# Patient Record
Sex: Male | Born: 1975 | Race: White | Hispanic: No | Smoking: Former smoker
Health system: Southern US, Community
[De-identification: ages and names within clinical notes are randomized; demographics above are authoritative.]

## PROBLEM LIST (undated history)

## (undated) DIAGNOSIS — J9610 Chronic respiratory failure, unspecified whether with hypoxia or hypercapnia: Secondary | ICD-10-CM

## (undated) DIAGNOSIS — G1221 Amyotrophic lateral sclerosis: Secondary | ICD-10-CM

## (undated) DIAGNOSIS — E119 Type 2 diabetes mellitus without complications: Secondary | ICD-10-CM

## (undated) HISTORY — PX: OTHER SURGICAL HISTORY: SHX169

## (undated) HISTORY — PX: APPENDECTOMY: SHX54

## (undated) HISTORY — PX: TRACHEOSTOMY: SUR1362

## (undated) HISTORY — PX: PEG TUBE PLACEMENT: SUR1034

---

## 2004-11-27 ENCOUNTER — Emergency Department: Payer: Self-pay | Admitting: Emergency Medicine

## 2004-12-01 ENCOUNTER — Emergency Department: Payer: Self-pay | Admitting: Unknown Physician Specialty

## 2005-01-27 ENCOUNTER — Emergency Department: Payer: Self-pay | Admitting: Emergency Medicine

## 2005-03-14 ENCOUNTER — Ambulatory Visit: Payer: Self-pay | Admitting: Family Medicine

## 2005-05-08 ENCOUNTER — Emergency Department: Payer: Self-pay | Admitting: Emergency Medicine

## 2005-11-05 ENCOUNTER — Inpatient Hospital Stay: Payer: Self-pay | Admitting: Internal Medicine

## 2005-11-05 ENCOUNTER — Other Ambulatory Visit: Payer: Self-pay

## 2005-11-26 ENCOUNTER — Emergency Department: Payer: Self-pay | Admitting: Emergency Medicine

## 2006-05-24 ENCOUNTER — Emergency Department: Payer: Self-pay | Admitting: Emergency Medicine

## 2006-06-08 ENCOUNTER — Emergency Department: Payer: Self-pay | Admitting: General Practice

## 2006-10-11 ENCOUNTER — Emergency Department: Payer: Self-pay | Admitting: Emergency Medicine

## 2007-03-08 ENCOUNTER — Emergency Department: Payer: Self-pay | Admitting: Emergency Medicine

## 2007-05-28 ENCOUNTER — Emergency Department: Payer: Self-pay | Admitting: Emergency Medicine

## 2008-03-04 ENCOUNTER — Emergency Department: Payer: Self-pay | Admitting: Emergency Medicine

## 2008-03-31 ENCOUNTER — Emergency Department: Payer: Self-pay | Admitting: Emergency Medicine

## 2008-04-17 ENCOUNTER — Other Ambulatory Visit: Payer: Self-pay

## 2008-04-17 ENCOUNTER — Emergency Department: Payer: Self-pay | Admitting: Emergency Medicine

## 2008-09-29 ENCOUNTER — Emergency Department: Payer: Self-pay | Admitting: Internal Medicine

## 2009-03-21 ENCOUNTER — Emergency Department: Payer: Self-pay | Admitting: Emergency Medicine

## 2009-03-22 ENCOUNTER — Emergency Department: Payer: Self-pay | Admitting: Emergency Medicine

## 2009-03-23 ENCOUNTER — Emergency Department: Payer: Self-pay | Admitting: Emergency Medicine

## 2009-04-01 ENCOUNTER — Emergency Department: Payer: Self-pay | Admitting: Emergency Medicine

## 2009-12-19 ENCOUNTER — Emergency Department: Payer: Self-pay | Admitting: Emergency Medicine

## 2010-04-16 ENCOUNTER — Emergency Department: Payer: Self-pay | Admitting: Emergency Medicine

## 2010-10-08 ENCOUNTER — Emergency Department: Payer: Self-pay | Admitting: Emergency Medicine

## 2011-08-18 ENCOUNTER — Emergency Department: Payer: Self-pay | Admitting: Emergency Medicine

## 2011-10-25 ENCOUNTER — Emergency Department: Payer: Self-pay

## 2011-10-25 LAB — URINALYSIS, COMPLETE
Bacteria: NONE SEEN
Blood: NEGATIVE
Glucose,UR: >=500 mg/dL (ref 0–75)
Leukocyte Esterase: NEGATIVE
Nitrite: NEGATIVE
Protein: NEGATIVE
Specific Gravity: 1.032 (ref 1.003–1.030)

## 2011-10-25 LAB — CBC
HCT: 49.6 % (ref 40.0–52.0)
HGB: 16.7 g/dL (ref 13.0–18.0)
MCH: 30.7 pg (ref 26.0–34.0)
MCHC: 33.7 g/dL (ref 32.0–36.0)
MCV: 91 fL (ref 80–100)
RBC: 5.45 10*6/uL (ref 4.40–5.90)
RDW: 12.6 % (ref 11.5–14.5)

## 2011-10-25 LAB — ETHANOL
Ethanol %: <0.003 % (ref 0.000–0.080)
Ethanol: <3 mg/dL

## 2011-10-25 LAB — COMPREHENSIVE METABOLIC PANEL
Alkaline Phosphatase: 91 U/L (ref 50–136)
BUN: 9 mg/dL (ref 7–18)
Bilirubin,Total: 0.4 mg/dL (ref 0.2–1.0)
Chloride: 89 mmol/L — ABNORMAL LOW (ref 98–107)
Co2: 36 mmol/L — ABNORMAL HIGH (ref 21–32)
Creatinine: 0.91 mg/dL (ref 0.60–1.30)
EGFR (African American): >60
EGFR (Non-African Amer.): >60
Osmolality: 288 (ref 275–301)
Potassium: 4.7 mmol/L (ref 3.5–5.1)
Sodium: 132 mmol/L — ABNORMAL LOW (ref 136–145)
Total Protein: 8.2 g/dL (ref 6.4–8.2)

## 2011-10-25 LAB — DRUG SCREEN, URINE
Amphetamines, Ur Screen: NEGATIVE (ref ?–1000)
Barbiturates, Ur Screen: NEGATIVE (ref ?–200)
Cannabinoid 50 Ng, Ur ~~LOC~~: NEGATIVE (ref ?–50)
Methadone, Ur Screen: NEGATIVE (ref ?–300)
Opiate, Ur Screen: NEGATIVE (ref ?–300)
Phencyclidine (PCP) Ur S: POSITIVE (ref ?–25)
Tricyclic, Ur Screen: NEGATIVE (ref ?–1000)

## 2011-10-25 LAB — SALICYLATE LEVEL: Salicylates, Serum: 1.8 mg/dL

## 2011-10-25 LAB — HEMOGLOBIN A1C: Hemoglobin A1C: 14.5 % — ABNORMAL HIGH (ref 4.2–6.3)

## 2012-06-03 ENCOUNTER — Emergency Department: Payer: Self-pay | Admitting: Internal Medicine

## 2012-06-03 LAB — CBC
HCT: 48.4 % (ref 40.0–52.0)
HGB: 16.6 g/dL (ref 13.0–18.0)
MCV: 88 fL (ref 80–100)
WBC: 11.3 10*3/uL — ABNORMAL HIGH (ref 3.8–10.6)

## 2012-06-03 LAB — COMPREHENSIVE METABOLIC PANEL
Albumin: 3.9 g/dL (ref 3.4–5.0)
Alkaline Phosphatase: 141 U/L — ABNORMAL HIGH (ref 50–136)
Anion Gap: 8 (ref 7–16)
Calcium, Total: 9.1 mg/dL (ref 8.5–10.1)
Co2: 27 mmol/L (ref 21–32)
EGFR (Non-African Amer.): >60
Glucose: 249 mg/dL — ABNORMAL HIGH (ref 65–99)
Potassium: 3.4 mmol/L — ABNORMAL LOW (ref 3.5–5.1)
SGOT(AST): 15 U/L (ref 15–37)

## 2012-06-04 ENCOUNTER — Emergency Department: Payer: Self-pay | Admitting: Emergency Medicine

## 2012-06-09 LAB — CULTURE, BLOOD (SINGLE)

## 2012-07-06 ENCOUNTER — Emergency Department: Payer: Self-pay | Admitting: Emergency Medicine

## 2012-07-08 ENCOUNTER — Emergency Department: Payer: Self-pay | Admitting: Emergency Medicine

## 2012-09-02 ENCOUNTER — Emergency Department: Payer: Self-pay | Admitting: Emergency Medicine

## 2012-09-02 LAB — CBC
HCT: 46.1 % (ref 40.0–52.0)
MCH: 30.5 pg (ref 26.0–34.0)
MCHC: 33.5 g/dL (ref 32.0–36.0)
MCV: 91 fL (ref 80–100)
Platelet: 234 10*3/uL (ref 150–440)
RBC: 5.06 10*6/uL (ref 4.40–5.90)
RDW: 13.3 % (ref 11.5–14.5)
WBC: 3.8 10*3/uL (ref 3.8–10.6)

## 2012-09-02 LAB — TSH: Thyroid Stimulating Horm: 1.54 u[IU]/mL

## 2012-09-02 LAB — ACETAMINOPHEN LEVEL: Acetaminophen: <2 ug/mL

## 2012-09-02 LAB — DRUG SCREEN, URINE
Barbiturates, Ur Screen: NEGATIVE (ref ?–200)
Cannabinoid 50 Ng, Ur ~~LOC~~: NEGATIVE (ref ?–50)
Cocaine Metabolite,Ur ~~LOC~~: NEGATIVE (ref ?–300)
Opiate, Ur Screen: NEGATIVE (ref ?–300)
Tricyclic, Ur Screen: NEGATIVE (ref ?–1000)

## 2012-09-02 LAB — URINALYSIS, COMPLETE
Bilirubin,UR: NEGATIVE
Blood: NEGATIVE
Ketone: NEGATIVE
Leukocyte Esterase: NEGATIVE
Ph: 6 (ref 4.5–8.0)
RBC,UR: <1 /HPF (ref 0–5)
Specific Gravity: 1.035 (ref 1.003–1.030)
Squamous Epithelial: NONE SEEN

## 2012-09-02 LAB — COMPREHENSIVE METABOLIC PANEL
Alkaline Phosphatase: 119 U/L (ref 50–136)
BUN: 5 mg/dL — ABNORMAL LOW (ref 7–18)
Bilirubin,Total: 0.1 mg/dL — ABNORMAL LOW (ref 0.2–1.0)
Chloride: 101 mmol/L (ref 98–107)
Co2: 23 mmol/L (ref 21–32)
Creatinine: 0.78 mg/dL (ref 0.60–1.30)
EGFR (African American): >60
EGFR (Non-African Amer.): >60
Osmolality: 285 (ref 275–301)
Potassium: 3.7 mmol/L (ref 3.5–5.1)
Sodium: 136 mmol/L (ref 136–145)

## 2012-09-02 LAB — TROPONIN I: Troponin-I: <0.02 ng/mL

## 2012-09-02 LAB — CK TOTAL AND CKMB (NOT AT ARMC)
CK, Total: 139 U/L (ref 35–232)
CK-MB: <0.5 ng/mL — ABNORMAL LOW (ref 0.5–3.6)

## 2012-09-02 LAB — ETHANOL
Ethanol %: 0.02 % (ref 0.000–0.080)
Ethanol: 20 mg/dL

## 2012-09-02 LAB — SALICYLATE LEVEL: Salicylates, Serum: <1.7 mg/dL

## 2013-01-22 ENCOUNTER — Emergency Department: Payer: Self-pay

## 2013-01-22 LAB — CBC
HCT: 48.3 % (ref 40.0–52.0)
HGB: 16.2 g/dL (ref 13.0–18.0)
MCH: 30.7 pg (ref 26.0–34.0)
MCV: 91 fL (ref 80–100)
RBC: 5.28 10*6/uL (ref 4.40–5.90)
RDW: 12.8 % (ref 11.5–14.5)

## 2013-01-22 LAB — BASIC METABOLIC PANEL
Anion Gap: 9 (ref 7–16)
Chloride: 95 mmol/L — ABNORMAL LOW (ref 98–107)
Co2: 29 mmol/L (ref 21–32)
Creatinine: 0.81 mg/dL (ref 0.60–1.30)
Glucose: 335 mg/dL — ABNORMAL HIGH (ref 65–99)
Potassium: 4.4 mmol/L (ref 3.5–5.1)

## 2013-08-24 ENCOUNTER — Emergency Department: Payer: Self-pay | Admitting: Emergency Medicine

## 2013-08-24 LAB — URINALYSIS, COMPLETE
Bilirubin,UR: NEGATIVE
Blood: NEGATIVE
Ph: 6 (ref 4.5–8.0)
Protein: NEGATIVE
Specific Gravity: 1.036 (ref 1.003–1.030)
Squamous Epithelial: NONE SEEN
WBC UR: <1 /HPF (ref 0–5)

## 2013-08-24 LAB — CBC
HCT: 43.2 % (ref 40.0–52.0)
HGB: 14.8 g/dL (ref 13.0–18.0)
MCHC: 34.3 g/dL (ref 32.0–36.0)
Platelet: 329 10*3/uL (ref 150–440)
RDW: 12.6 % (ref 11.5–14.5)

## 2013-08-24 LAB — COMPREHENSIVE METABOLIC PANEL
Albumin: 3.5 g/dL (ref 3.4–5.0)
Alkaline Phosphatase: 105 U/L
BUN: 9 mg/dL (ref 7–18)
Chloride: 95 mmol/L — ABNORMAL LOW (ref 98–107)
Co2: 24 mmol/L (ref 21–32)
EGFR (African American): >60
EGFR (Non-African Amer.): >60
Osmolality: 279 (ref 275–301)
Potassium: 3.3 mmol/L — ABNORMAL LOW (ref 3.5–5.1)
SGOT(AST): 20 U/L (ref 15–37)
Total Protein: 6.9 g/dL (ref 6.4–8.2)

## 2013-08-24 LAB — DRUG SCREEN, URINE
Barbiturates, Ur Screen: NEGATIVE (ref ?–200)
Cocaine Metabolite,Ur ~~LOC~~: NEGATIVE (ref ?–300)
MDMA (Ecstasy)Ur Screen: NEGATIVE (ref ?–500)
Opiate, Ur Screen: NEGATIVE (ref ?–300)
Phencyclidine (PCP) Ur S: POSITIVE (ref ?–25)

## 2013-08-24 LAB — ETHANOL: Ethanol: 7 mg/dL

## 2013-08-24 LAB — ACETAMINOPHEN LEVEL: Acetaminophen: <2 ug/mL

## 2013-08-24 LAB — TROPONIN I: Troponin-I: <0.02 ng/mL

## 2014-03-28 ENCOUNTER — Emergency Department: Payer: Self-pay | Admitting: Emergency Medicine

## 2014-03-28 LAB — COMPREHENSIVE METABOLIC PANEL
ALT: 31 U/L (ref 12–78)
Albumin: 3.2 g/dL — ABNORMAL LOW (ref 3.4–5.0)
Alkaline Phosphatase: 88 U/L
Anion Gap: 6 — ABNORMAL LOW (ref 7–16)
BUN: 17 mg/dL (ref 7–18)
Bilirubin,Total: 0.2 mg/dL (ref 0.2–1.0)
CALCIUM: 8.2 mg/dL — AB (ref 8.5–10.1)
Chloride: 99 mmol/L (ref 98–107)
Co2: 26 mmol/L (ref 21–32)
Creatinine: 1.07 mg/dL (ref 0.60–1.30)
GLUCOSE: 454 mg/dL — AB (ref 65–99)
Osmolality: 284 (ref 275–301)
POTASSIUM: 4.1 mmol/L (ref 3.5–5.1)
SGOT(AST): 26 U/L (ref 15–37)
SODIUM: 131 mmol/L — AB (ref 136–145)
Total Protein: 6.5 g/dL (ref 6.4–8.2)

## 2014-03-28 LAB — CBC
HCT: 46.9 % (ref 40.0–52.0)
HGB: 15.6 g/dL (ref 13.0–18.0)
MCH: 30.8 pg (ref 26.0–34.0)
MCHC: 33.3 g/dL (ref 32.0–36.0)
MCV: 93 fL (ref 80–100)
Platelet: 312 10*3/uL (ref 150–440)
RBC: 5.05 10*6/uL (ref 4.40–5.90)
RDW: 12.8 % (ref 11.5–14.5)
WBC: 6.9 10*3/uL (ref 3.8–10.6)

## 2014-03-28 LAB — DRUG SCREEN, URINE
AMPHETAMINES, UR SCREEN: NEGATIVE (ref ?–1000)
BENZODIAZEPINE, UR SCRN: NEGATIVE (ref ?–200)
Barbiturates, Ur Screen: NEGATIVE (ref ?–200)
COCAINE METABOLITE, UR ~~LOC~~: NEGATIVE (ref ?–300)
Cannabinoid 50 Ng, Ur ~~LOC~~: NEGATIVE (ref ?–50)
MDMA (Ecstasy)Ur Screen: NEGATIVE (ref ?–500)
METHADONE, UR SCREEN: NEGATIVE (ref ?–300)
OPIATE, UR SCREEN: NEGATIVE (ref ?–300)
PHENCYCLIDINE (PCP) UR S: NEGATIVE (ref ?–25)
Tricyclic, Ur Screen: NEGATIVE (ref ?–1000)

## 2014-03-28 LAB — URINALYSIS, COMPLETE
BACTERIA: NONE SEEN
BILIRUBIN, UR: NEGATIVE
BLOOD: NEGATIVE
Glucose,UR: >=500 mg/dL (ref 0–75)
LEUKOCYTE ESTERASE: NEGATIVE
Nitrite: NEGATIVE
Ph: 6 (ref 4.5–8.0)
Protein: NEGATIVE
RBC,UR: <1 /HPF (ref 0–5)
SPECIFIC GRAVITY: 1.03 (ref 1.003–1.030)
SQUAMOUS EPITHELIAL: NONE SEEN
WBC UR: <1 /HPF (ref 0–5)

## 2014-03-28 LAB — ETHANOL: Ethanol %: <0.003 % (ref 0.000–0.080)

## 2014-03-28 LAB — TROPONIN I: Troponin-I: <0.02 ng/mL

## 2014-03-28 LAB — AMMONIA: AMMONIA, PLASMA: 41 umol/L — AB (ref 11–32)

## 2014-03-29 ENCOUNTER — Emergency Department: Payer: Self-pay | Admitting: Emergency Medicine

## 2014-03-29 LAB — COMPREHENSIVE METABOLIC PANEL
ALK PHOS: 84 U/L
ALT: 30 U/L (ref 12–78)
AST: 20 U/L (ref 15–37)
Albumin: 3.8 g/dL (ref 3.4–5.0)
Anion Gap: 5 — ABNORMAL LOW (ref 7–16)
BILIRUBIN TOTAL: 0.3 mg/dL (ref 0.2–1.0)
BUN: 11 mg/dL (ref 7–18)
CALCIUM: 9.7 mg/dL (ref 8.5–10.1)
CHLORIDE: 99 mmol/L (ref 98–107)
Co2: 31 mmol/L (ref 21–32)
Creatinine: 0.79 mg/dL (ref 0.60–1.30)
EGFR (African American): >60
EGFR (Non-African Amer.): >60
Glucose: 311 mg/dL — ABNORMAL HIGH (ref 65–99)
OSMOLALITY: 281 (ref 275–301)
Potassium: 4.6 mmol/L (ref 3.5–5.1)
Sodium: 135 mmol/L — ABNORMAL LOW (ref 136–145)
Total Protein: 7.2 g/dL (ref 6.4–8.2)

## 2014-03-29 LAB — CBC
HCT: 47.6 % (ref 40.0–52.0)
HGB: 15.9 g/dL (ref 13.0–18.0)
MCH: 30.9 pg (ref 26.0–34.0)
MCHC: 33.4 g/dL (ref 32.0–36.0)
MCV: 93 fL (ref 80–100)
Platelet: 332 10*3/uL (ref 150–440)
RBC: 5.15 10*6/uL (ref 4.40–5.90)
RDW: 12.5 % (ref 11.5–14.5)
WBC: 6.8 10*3/uL (ref 3.8–10.6)

## 2014-03-29 LAB — URINALYSIS, COMPLETE
BACTERIA: NONE SEEN
BLOOD: NEGATIVE
Bilirubin,UR: NEGATIVE
Glucose,UR: >=500 mg/dL (ref 0–75)
KETONE: NEGATIVE
Leukocyte Esterase: NEGATIVE
Nitrite: NEGATIVE
Ph: 7 (ref 4.5–8.0)
Protein: NEGATIVE
RBC, UR: NONE SEEN /HPF (ref 0–5)
SPECIFIC GRAVITY: 1.029 (ref 1.003–1.030)
Squamous Epithelial: NONE SEEN

## 2014-04-02 LAB — CULTURE, BLOOD (SINGLE)

## 2014-06-27 ENCOUNTER — Inpatient Hospital Stay: Payer: Self-pay | Admitting: Internal Medicine

## 2014-06-27 LAB — URINALYSIS, COMPLETE
BLOOD: NEGATIVE
Bacteria: NONE SEEN
Bilirubin,UR: NEGATIVE
Glucose,UR: >=500 mg/dL (ref 0–75)
Hyaline Cast: 12
KETONE: NEGATIVE
Leukocyte Esterase: NEGATIVE
Nitrite: NEGATIVE
PH: 5 (ref 4.5–8.0)
PROTEIN: NEGATIVE
RBC,UR: <1 /HPF (ref 0–5)
SPECIFIC GRAVITY: 1.006 (ref 1.003–1.030)
Squamous Epithelial: <1
WBC UR: 1 /HPF (ref 0–5)

## 2014-06-27 LAB — DRUG SCREEN, URINE
Amphetamines, Ur Screen: NEGATIVE (ref ?–1000)
Barbiturates, Ur Screen: NEGATIVE (ref ?–200)
Benzodiazepine, Ur Scrn: NEGATIVE (ref ?–200)
Cannabinoid 50 Ng, Ur ~~LOC~~: NEGATIVE (ref ?–50)
Cocaine Metabolite,Ur ~~LOC~~: NEGATIVE (ref ?–300)
MDMA (Ecstasy)Ur Screen: NEGATIVE (ref ?–500)
METHADONE, UR SCREEN: NEGATIVE (ref ?–300)
Opiate, Ur Screen: NEGATIVE (ref ?–300)
Phencyclidine (PCP) Ur S: POSITIVE (ref ?–25)
Tricyclic, Ur Screen: NEGATIVE (ref ?–1000)

## 2014-06-27 LAB — COMPREHENSIVE METABOLIC PANEL
ALBUMIN: 3.2 g/dL — AB (ref 3.4–5.0)
ALT: 30 U/L
ANION GAP: 9 (ref 7–16)
Alkaline Phosphatase: 62 U/L
BILIRUBIN TOTAL: 0.2 mg/dL (ref 0.2–1.0)
BUN: 21 mg/dL — ABNORMAL HIGH (ref 7–18)
CALCIUM: 7.8 mg/dL — AB (ref 8.5–10.1)
CO2: 23 mmol/L (ref 21–32)
CREATININE: 1.43 mg/dL — AB (ref 0.60–1.30)
Chloride: 104 mmol/L (ref 98–107)
GFR CALC NON AF AMER: 60 — AB
Glucose: 258 mg/dL — ABNORMAL HIGH (ref 65–99)
Osmolality: 284 (ref 275–301)
Potassium: 4.1 mmol/L (ref 3.5–5.1)
SGOT(AST): 25 U/L (ref 15–37)
Sodium: 136 mmol/L (ref 136–145)
Total Protein: 6 g/dL — ABNORMAL LOW (ref 6.4–8.2)

## 2014-06-27 LAB — CBC
HCT: 43.2 % (ref 40.0–52.0)
HGB: 13.8 g/dL (ref 13.0–18.0)
MCH: 29.6 pg (ref 26.0–34.0)
MCHC: 31.9 g/dL — ABNORMAL LOW (ref 32.0–36.0)
MCV: 93 fL (ref 80–100)
PLATELETS: 284 10*3/uL (ref 150–440)
RBC: 4.66 10*6/uL (ref 4.40–5.90)
RDW: 12.9 % (ref 11.5–14.5)
WBC: 18.1 10*3/uL — AB (ref 3.8–10.6)

## 2014-06-27 LAB — TROPONIN I

## 2014-06-27 LAB — ETHANOL: Ethanol: 8 mg/dL

## 2014-06-27 LAB — ACETAMINOPHEN LEVEL

## 2014-06-27 LAB — LIPASE, BLOOD: LIPASE: 180 U/L (ref 73–393)

## 2014-06-28 LAB — CBC WITH DIFFERENTIAL/PLATELET
BASOS ABS: 0 10*3/uL (ref 0.0–0.1)
Basophil %: 0.5 %
EOS ABS: 0 10*3/uL (ref 0.0–0.7)
EOS PCT: 0.1 %
HCT: 41.2 % (ref 40.0–52.0)
HGB: 13.6 g/dL (ref 13.0–18.0)
LYMPHS ABS: 1.4 10*3/uL (ref 1.0–3.6)
LYMPHS PCT: 15.1 %
MCH: 30.6 pg (ref 26.0–34.0)
MCHC: 33.1 g/dL (ref 32.0–36.0)
MCV: 93 fL (ref 80–100)
MONO ABS: 0.5 x10 3/mm (ref 0.2–1.0)
MONOS PCT: 5.8 %
NEUTROS ABS: 7.3 10*3/uL — AB (ref 1.4–6.5)
NEUTROS PCT: 78.5 %
PLATELETS: 266 10*3/uL (ref 150–440)
RBC: 4.46 10*6/uL (ref 4.40–5.90)
RDW: 13.1 % (ref 11.5–14.5)
WBC: 9.3 10*3/uL (ref 3.8–10.6)

## 2014-06-28 LAB — BASIC METABOLIC PANEL
ANION GAP: 7 (ref 7–16)
BUN: 15 mg/dL (ref 7–18)
Calcium, Total: 6.9 mg/dL — CL (ref 8.5–10.1)
Chloride: 107 mmol/L (ref 98–107)
Co2: 26 mmol/L (ref 21–32)
Creatinine: 0.75 mg/dL (ref 0.60–1.30)
EGFR (Non-African Amer.): >60
GLUCOSE: 198 mg/dL — AB (ref 65–99)
Osmolality: 286 (ref 275–301)
POTASSIUM: 4 mmol/L (ref 3.5–5.1)
Sodium: 140 mmol/L (ref 136–145)

## 2014-07-02 LAB — CULTURE, BLOOD (SINGLE)

## 2014-07-13 ENCOUNTER — Emergency Department: Payer: Self-pay | Admitting: Emergency Medicine

## 2014-07-13 LAB — URINALYSIS, COMPLETE
BLOOD: NEGATIVE
Bacteria: NONE SEEN
Bilirubin,UR: NEGATIVE
Ketone: NEGATIVE
LEUKOCYTE ESTERASE: NEGATIVE
NITRITE: NEGATIVE
PH: 5 (ref 4.5–8.0)
Protein: NEGATIVE
Specific Gravity: 1.025 (ref 1.003–1.030)
Squamous Epithelial: NONE SEEN

## 2014-07-13 LAB — DRUG SCREEN, URINE
Amphetamines, Ur Screen: NEGATIVE (ref ?–1000)
BARBITURATES, UR SCREEN: NEGATIVE (ref ?–200)
Benzodiazepine, Ur Scrn: NEGATIVE (ref ?–200)
Cannabinoid 50 Ng, Ur ~~LOC~~: NEGATIVE (ref ?–50)
Cocaine Metabolite,Ur ~~LOC~~: NEGATIVE (ref ?–300)
MDMA (Ecstasy)Ur Screen: NEGATIVE (ref ?–500)
Methadone, Ur Screen: NEGATIVE (ref ?–300)
Opiate, Ur Screen: POSITIVE (ref ?–300)
PHENCYCLIDINE (PCP) UR S: POSITIVE (ref ?–25)
Tricyclic, Ur Screen: NEGATIVE (ref ?–1000)

## 2014-07-13 LAB — COMPREHENSIVE METABOLIC PANEL
ALT: 29 U/L
Albumin: 3.9 g/dL (ref 3.4–5.0)
Alkaline Phosphatase: 94 U/L
Anion Gap: 7 (ref 7–16)
BUN: 13 mg/dL (ref 7–18)
Bilirubin,Total: 0.2 mg/dL (ref 0.2–1.0)
CALCIUM: 8.7 mg/dL (ref 8.5–10.1)
CREATININE: 0.82 mg/dL (ref 0.60–1.30)
Chloride: 98 mmol/L (ref 98–107)
Co2: 29 mmol/L (ref 21–32)
Glucose: 257 mg/dL — ABNORMAL HIGH (ref 65–99)
Osmolality: 277 (ref 275–301)
POTASSIUM: 4.1 mmol/L (ref 3.5–5.1)
SGOT(AST): 24 U/L (ref 15–37)
Sodium: 134 mmol/L — ABNORMAL LOW (ref 136–145)
TOTAL PROTEIN: 7.8 g/dL (ref 6.4–8.2)

## 2014-07-13 LAB — CBC
HCT: 48 % (ref 40.0–52.0)
HGB: 16 g/dL (ref 13.0–18.0)
MCH: 30.6 pg (ref 26.0–34.0)
MCHC: 33.3 g/dL (ref 32.0–36.0)
MCV: 92 fL (ref 80–100)
PLATELETS: 340 10*3/uL (ref 150–440)
RBC: 5.23 10*6/uL (ref 4.40–5.90)
RDW: 13 % (ref 11.5–14.5)
WBC: 7.2 10*3/uL (ref 3.8–10.6)

## 2014-07-13 LAB — TROPONIN I: Troponin-I: <0.02 ng/mL

## 2014-07-13 LAB — ETHANOL: Ethanol: <3 mg/dL

## 2014-07-13 LAB — SALICYLATE LEVEL: SALICYLATES, SERUM: 2.5 mg/dL

## 2014-07-13 LAB — LIPASE, BLOOD: Lipase: 174 U/L (ref 73–393)

## 2014-07-13 LAB — ACETAMINOPHEN LEVEL: Acetaminophen: <2 ug/mL

## 2014-08-05 ENCOUNTER — Emergency Department: Payer: Self-pay | Admitting: Student

## 2015-01-12 ENCOUNTER — Emergency Department
Admit: 2015-01-12 | Disposition: A | Discharge status: Home / Self Care | Payer: Self-pay | Admitting: Emergency Medicine

## 2015-01-12 LAB — CBC WITH DIFFERENTIAL/PLATELET
Basophil #: 0.1 10*3/uL (ref 0.0–0.1)
Basophil %: 0.9 %
Eosinophil #: 0.2 10*3/uL (ref 0.0–0.7)
Eosinophil %: 2.2 %
HCT: 40.9 % (ref 40.0–52.0)
HGB: 13.9 g/dL (ref 13.0–18.0)
Lymphocyte #: 3.3 10*3/uL (ref 1.0–3.6)
Lymphocyte %: 46.7 %
MCH: 31.3 pg (ref 26.0–34.0)
MCHC: 34 g/dL (ref 32.0–36.0)
MCV: 92 fL (ref 80–100)
Monocyte #: 0.7 x10 3/mm (ref 0.2–1.0)
Monocyte %: 10 %
Neutrophil #: 2.8 10*3/uL (ref 1.4–6.5)
Neutrophil %: 40.2 %
Platelet: 357 10*3/uL (ref 150–440)
RBC: 4.44 10*6/uL (ref 4.40–5.90)
RDW: 13.8 % (ref 11.5–14.5)
WBC: 7.1 10*3/uL (ref 3.8–10.6)

## 2015-01-12 LAB — COMPREHENSIVE METABOLIC PANEL
ALT: 22 U/L
Albumin: 4.1 g/dL
Alkaline Phosphatase: 57 U/L
Anion Gap: 7 (ref 7–16)
BUN: 12 mg/dL
Bilirubin,Total: 0.3 mg/dL
CREATININE: 0.63 mg/dL
Calcium, Total: 9 mg/dL
Chloride: 102 mmol/L
Co2: 28 mmol/L
EGFR (African American): >60
EGFR (Non-African Amer.): >60
Glucose: 136 mg/dL — ABNORMAL HIGH
Potassium: 3.8 mmol/L
SGOT(AST): 18 U/L
SODIUM: 137 mmol/L
TOTAL PROTEIN: 7 g/dL

## 2015-01-12 LAB — DRUG SCREEN, URINE
Amphetamines, Ur Screen: NEGATIVE
BARBITURATES, UR SCREEN: NEGATIVE
Benzodiazepine, Ur Scrn: NEGATIVE
CANNABINOID 50 NG, UR ~~LOC~~: NEGATIVE
COCAINE METABOLITE, UR ~~LOC~~: NEGATIVE
MDMA (Ecstasy)Ur Screen: NEGATIVE
METHADONE, UR SCREEN: NEGATIVE
OPIATE, UR SCREEN: NEGATIVE
PHENCYCLIDINE (PCP) UR S: POSITIVE
Tricyclic, Ur Screen: NEGATIVE

## 2015-01-12 LAB — URINALYSIS, COMPLETE
Bacteria: NONE SEEN
Glucose,UR: >=500 mg/dL (ref 0–75)
Nitrite: NEGATIVE
PROTEIN: NEGATIVE
Ph: 5 (ref 4.5–8.0)
Specific Gravity: 1.022 (ref 1.003–1.030)
Squamous Epithelial: NONE SEEN

## 2015-01-12 LAB — ACETAMINOPHEN LEVEL: Acetaminophen: <10 ug/mL

## 2015-01-12 LAB — ETHANOL: Ethanol: <5 mg/dL

## 2015-01-13 LAB — HEPATIC FUNCTION PANEL A (ARMC)
ALT: 21 U/L
Albumin: 4.1 g/dL
Alkaline Phosphatase: 56 U/L
BILIRUBIN TOTAL: 0.3 mg/dL
Bilirubin, Direct: <0.1 mg/dL
SGOT(AST): 18 U/L
Total Protein: 7 g/dL

## 2015-01-13 LAB — SALICYLATE LEVEL: Salicylates, Serum: <4 mg/dL

## 2015-01-22 NOTE — Consult Note (Signed)
PATIENT NAME:  Randy Roach, Pearley D MR#:  [id number] DATE OF BIRTH:  Feb 06, 1976  DATE OF CONSULTATION:  08/24/2013  CONSULTING PHYSICIAN:  Audery AmelJohn T. Clapacs, MD  IDENTIFYING INFORMATION AND REASON FOR CONSULT: A 39 year old male with a history of dextromethorphan abuse, and alcohol abuse who came into the hospital Emergency Room with numbness and paralysis of his left side. Consultation for recommendation about substance abuse treatment.   HISTORY OF PRESENT ILLNESS: The patient says that he has been taking heavy amounts of Coricidin pills, about 32 of them at a time 3 times a week. That is his standard cocktail. He usually drinks at least a 40 ounce beer along with it. He denies that he is abusing other drugs. He had new onset of this numbness and feeling of paralysis on his left side, which brought him into the hospital. He says that overall his mood is a little bit depressed, which is chronic from missing his children. He feels under a lot of stress because he was a great deal of money on child support. He is interested in working in a substance abuse program, but only if he can continue to do his job regularly. He denies suicidal or homicidal ideation. Denies any current psychotic symptoms. Shows reasonable insight into his situation.   PAST PSYCHIATRIC HISTORY: He presented to the Emergency Room several times in the past with a similar situations and been referred to outpatient treatment. He says that he has tried to go to some treatment in the past, but has not been able to keep up with it because of his job. He thinks he has been hospitalized before, he is not quite sure where. It does not look like he was hospitalized psychiatrically here, but he was hospitalized here for his diabetes 2007. Denies any history of suicide attempts. Denies any history of violence to others. Denies any diagnosis or treatment for any other mental health problems.   SOCIAL HISTORY: He lives with his aunt. 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 He owes child support from what I understand at least two different people and is way behind on it. He has a lot of financial worries. Feels bad about not being able to see his children more frequently.   PAST MEDICAL HISTORY: The patient has diabetes and is maintained on metformin. From what he tells me he is not needed insulin or at least not taking it recently. No other acute chronic medical problems.   FAMILY HISTORY: No family history known.   SUBSTANCE ABUSE HISTORY: He has been abusing dextromethorphan for years now and has had multiple bad outcomes from it similar to this area.   CURRENT MEDICATIONS: Metformin think he is setting 1000 mg twice a day.   ALLERGIES: No known drug allergies.   REVIEW OF SYSTEMS: Says that his numbness and paralysis is getting better on the left side. Mood is still a little bit down, but not severely depressed. Totally denies suicidal ideation, denies homicidal ideation, denies any hallucinations. He has been feeling a little sick to his stomach, but it is getting better. He not throwing up currently.   MENTAL STATUS EXAMINATION: Disheveled, sick-looking gentleman interviewed in the Emergency Room. He was awake and interactive, but passive.  Eye contact is okay. Psychomotor activity very slow. Speech quiet, minimal in amount. Affect some reactivity, not terribly sad. Mood stated as being okay now. Thoughts are slow but lucid. No obvious loosening of associations or delusions. Denies auditory or visual hallucinations. Denies suicidal  or homicidal ideation. Judgment and insight poor about his substance abuse. Baseline intelligence average. Alert and oriented x 4.   LABORATORY RESULTS: His blood glucose when he came in was 485. Sodium registered low at 129, potassium low at 3.3. Alcohol level was 7. CBC normal. Urinalysis very positive for glucose. Drug screen positive for phencyclidine and cannabis, salicylates and acetaminophen  negative.   ASSESSMENT: This is a 39 year old man with chronic substance abuse problems. He is not suicidal, not homicidal, not requiring medical detox. The patient was counseled about the serious risks related to substance abuse. He was counseled about the importance of staying active in treatment. He accepted all of this. I have requested social work to give him a referral to  [facility] or other appropriate substance abuse treatment suggested that he follow up with Alcoholics Anonymous in the community.   DIAGNOSIS, PRINCIPAL AND PRIMARY:   AXIS I: Hallucinogen dependence.   SECONDARY DIAGNOSES: AXIS I: Alcohol dependence.   AXIS II: Deferred.   AXIS III: Diabetes, acute toxicity from dextromethorphan  AXIS IV:  Severe.   AXIS V: 50    ____________________________ Audery Amel, MD jtc:cc D: 08/24/2013 18:16:22 ET T: 08/24/2013 23:39:19 ET JOB#: [id number]  cc: Audery Amel, MD, <Dictator> Audery Amel MD ELECTRONICALLY SIGNED 08/25/2013 9:21

## 2015-01-22 NOTE — Consult Note (Signed)
Brief Consult Note: Diagnosis: sedative/hallucinagin dependence.   Patient was seen by consultant.   Consult note dictated.   Orders entered.   Comments: Psychiatry: Patient seen. Longstanding history of abuse of dextromethorphan and alcohol. Patient says he wants to quit. Denies any suicidal or homicidal ideation. Interested in receiving information about outpt treatment. Supportive education and counceling done. [facility].  Electronic Signatures: Audery Amellapacs, Imogean Ciampa T (MD)  (Signed [phone]-Nov-14 18:06)  Authored: Brief Consult Note   Last Updated: 23-Nov-14 18:06 by Audery Amellapacs, Lourdes Manning T (MD)

## 2015-01-23 NOTE — Consult Note (Signed)
PATIENT NAME:  Randy Roach, [name] MR#:  [id number] DATE OF BIRTH:  [birth year]  DATE OF CONSULTATION:  06/28/2014  REFERRING PHYSICIAN:   CONSULTING PHYSICIAN:  Jessen Siegman K. Doniesha Landau, MD  AGE: 39 years.  SEX: Male.  RACE: White.  SUBJECTIVE: The patient was seen in consultation, room number CCU4 at [facility]. 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 The patient is divorced for 2 years after being married for a few years and has been living with his maternal aunt with whom he gets along well. The patient was brought after his mother found him on the floor after he was drinking too much beer and had a fall.  HISTORY OF PRESENT ILLNESS: The patient reports that he drinks about 18 beers or 12 beers on weekends and has been doing this for the past 4 or 5 years.  ALCOHOL AND DRUGS: As stated above, has been drinking 18 beers and 12 beers as he can get them, only on weekends and this has been going on for 4 years.  No history of DWIs, never history of public drunkenness. The patient reports that he has been to detoxification program on 2 occasions and each time he completes detoxification program he stays sober for a month and starts drinking again, but he denies drinking on weekdays. In addition, he has been using Coricidin and he takes 2 boxes of the same once in 3 or 4 days because he needs to take it and he reported "it makes me feel drunk and I like that feeling."  PAST PSYCHIATRIC HISTORY: No history of inpatient psychiatry. No history of suicide attempt. Not being followed by psychiatry.  MENTAL STATUS: The patient is alert and oriented to place, person and time; calm, pleasant and cooperative. He knew the reason why he was here. He denies feeling depressed, denies feeling hopeless or helpless. No psychosis. Does not appear to respond to internal stimuli. Denies any suicidal or homicidal plans and contracts  for safety. He is eager to go home and get help as needed for his substance abuse and Coricidin abuse problems and contracts for safety.  IMPRESSION: Polysubstance abuse, that is Coricidin and alcohol.  RECOMMENDATIONS: Discharge the patient when he is medically stable and clear. Please ask social services to refer him to appropriate mental health agency in the community where he can get help for his alcohol and Coricidin abuse problems, as he wants to get help for the same.   ____________________________ Jannet MantisSurya K. Guss Bundehalla, MD skc:TT D: 06/28/2014 11:58:43 ET T: 06/28/2014 14:23:19 ET JOB#: [id number]  cc: Monika SalkSurya K. Guss Bundehalla, MD, <Dictator> Beau FannySURYA K Havish Petties MD ELECTRONICALLY SIGNED 07/17/2014 14:23

## 2015-01-23 NOTE — Discharge Summary (Signed)
PATIENT NAME:  Randy Roach, Randy Roach MR#:  [id number] DATE OF BIRTH:  01-Mar-1976  DATE OF ADMISSION:  06/27/2014 DATE OF DISCHARGE:  06/29/2014  DISCHARGE DIAGNOSES:  1.  Syncope.  2.  Overdose on Coricidin. 3.  Diabetes.  4.  Substance abuse.  5.  Tobacco abuse.  6.  Alcohol abuse.  7.  Right upper quadrant abdominal pain with negative workup for any pathology.  CONSULTANTS: Psychiatry, Dr. Margarita RanaSurya Challa.  DIAGNOSTIC DATA: Admitting glucose 258, BUN 21, creatinine 1.43, sodium 136, potassium 4.1, chloride 104, CO2 23, calcium 7.8. LFTs were normal, except total protein of 6.2 and albumin of 3.2. Toxic urine drug screen was positive for phencyclidine. WBC on admission 18.1, hemoglobin 13.8, platelet count 284,000. Blood cultures x2: No growth. Acetaminophen less than 2.   CT of the abdomen showed mild gallbladder wall thickening without pericholecystic inflammatory changes.  Right upper quadrant ultrasound showed no cholelithiasis or sonographic evidence of acute cholecystitis.   HIDA scan negative.   HOSPITAL COURSE: Please refer to H and P done by the admitting physician. The patient is a 39 year old white male who has a history of having substance abuse in the past who presented to the ED after he had a syncopal episode. The patient had taken Coricidin because he "was feeling tired." The patient came to the ED and was noted to be hypotensive, given IV fluids and monitored on tele. He had no further arrhythmias or any other abnormalities noted. He was complaining of abdominal pain on presentation. A CT scan initially showed thickening of the gallbladder. Therefore, he underwent a right upper quadrant ultrasound, which was negative. This patient subsequently had a HIDA scan, which was negative. He was seen by psychiatry and recommended outpatient follow-up. At this time, case manager has given him options for outpatient psychiatric and substance abuse counseling. He is currently stable for  discharge. He  was also noticed to have some acute renal failure that resolved with IV fluids.   DISCHARGE MEDICATIONS: Metformin 500 one tab p.o. b.i.Roach., Tylenol 650 q. 4 p.r.n. for pain.   DISCHARGE DIET: Carbohydrate-controlled diet.   DISCHARGE ACTIVITY: As tolerated.   DISCHARGE FOLLOWUP: Follow up with primary MD in 1 to 2 weeks. Follow with substance abuse counseling as an outpatient.  TIME SPENT: 35 minutes.  ____________________________ Lacie ScottsShreyang H. Allena KatzPatel, MD shp:sb Roach: 06/30/2014 08:26:02 ET T: 06/30/2014 09:10:19 ET JOB#: [id number]  cc: Arael Piccione H. Allena KatzPatel, MD, <Dictator> Charise CarwinSHREYANG H Brittnae Aschenbrenner MD ELECTRONICALLY SIGNED 07/03/2014 17:59

## 2015-01-23 NOTE — H&P (Signed)
PATIENT NAME:  Randy Roach, Randy Roach MR#:  [id number] DATE OF BIRTH:  [birth year]  DATE OF ADMISSION:  06/27/2014  REFERRING PHYSICIAN: Bobetta LimeEryka A. Inocencio HomesGayle, MD  PRIMARY CARE PHYSICIAN: Nonlocal.   CHIEF COMPLAINT: Syncope.  HISTORY OF PRESENT ILLNESS: This is a 39 year old Caucasian male with a history of type 2 diabetes as well as substance abuse with Coricidin presenting after a fall/syncopal episode. He was in his usual state of health when apparently today he attempted to walk to the bathroom, felt lightheaded, and then subsequently passed out. Short period of time of loss of consciousness without head trauma. No loss of bowel or bladder function. No postictal confusion. He abuses Coricidin of unclear formulation; whether this contains dextromethorphan as well as chlorphentermine. Unknown if this also contains Tylenol. He states that he used approximately 32 tablets yesterday. Upon arrival to the Emergency Department, he is noted to be hypotensive, requiring, so far, 4 L of normal saline boluses to maintain blood pressure into the 80s systolic.   REVIEW OF SYSTEMS:  CONSTITUTIONAL: She denies any fevers, chills, weakness, fatigue.  EYES: Denies blurry vision, double vision, or eye pain. ENT: Denies tinnitus, ear pain, hearing loss. RESPIRATORY: Denies cough, wheeze, shortness of breath.  CARDIOVASCULAR: Denies chest pain, palpitations, edema.  GASTROINTESTINAL: Positive for abdominal pain on arrival. However, currently denies any nausea, vomiting, diarrhea, or abdominal pain.  GENITOURINARY: Denies dysuria, hematuria.  ENDOCRINE: Denies nocturia, thyroid problems. HEMATOLOGIC AND LYMPHATIC: Denies easy bruising, bleeding.  SKIN: No rashes or lesions.  MUSCULOSKELETAL: Denies pain in neck, back, shoulders, knees, hips or arthritic symptoms.  NEUROLOGIC: Denies paralysis, paresthesias.  PSYCHIATRIC: Denies anxiety or depressive symptoms.  Otherwise, full review of systems performed by me is  negative.   PAST MEDICAL HISTORY: Type 2 diabetes on p.o. agents only as well as substance abuse for numerous years.   SOCIAL HISTORY: Positive for tobacco use as well as alcohol use and substance abuse. Coricidin is his drug of choice. Looking through our records, this has been the case since at least 2007.   FAMILY HISTORY: No known cardiovascular or pulmonary disorders.   ALLERGIES: No known drug allergies.   HOME MEDICATIONS: Metformin 500 mg p.o. b.i.Roach.   PHYSICAL EXAMINATION:  VITAL SIGNS: Temperature 97.4, heart rate 94, respirations 16, blood pressure 87/61, saturating 100% on room air. Weight 64.9 kg, BMI 22.4.  GENERAL: Somewhat disheveled Caucasian gentleman, currently in no acute distress.  HEAD: Normocephalic, atraumatic.  EYES: Pupils equal, round, reactive to light. Extraocular muscles intact. No scleral icterus.  MOUTH: Moist mucous membranes. Dentition intact. No abscess noted. EARS, NOSE, THROAT: Clear without exudates. No external lesions.  NECK: Supple. No thyromegaly. No nodules. No JVD.  PULMONARY: Clear to auscultation bilaterally without wheezes, rales, or rhonchi. No use of accessory muscles. Good respiratory effort. CHEST: Nontender to palpation.  CARDIOVASCULAR: S1, S2, regular rate and rhythm. No murmurs, rubs, or gallops. No edema. Pedal pulses 2+ bilaterally. GASTROINTESTINAL: Soft, nontender, nondistended. No masses. Positive bowel sounds. No hepatosplenomegaly.  MUSCULOSKELETAL: No swelling, clubbing, or edema. Range of motion full in all extremities.  NEUROLOGIC: Cranial nerves II through XII intact. No gross focal neurologic deficits. Sensation intact. Reflexes intact.  SKIN: No ulceration, lesions, rashes, or cyanosis. Skin warm and dry. Turgor intact. PSYCHIATRIC: Mood and affect within normal limits. Patient awake, alert, oriented x 3. Insight and judgment intact.   LABORATORY DATA: Sodium 136, potassium 4.1, chloride 104, bicarbonate 23, BUN 21,  creatinine 1.43, glucose 258. Total protein 6, albumin 3.2, otherwise  within normal limits. Urine drug screen positive for PCP. WBC 18.1, hemoglobin 13.8, platelets of 284,000. Urinalysis negative for evidence of infection. Lactic acid 1.2. Multiple imaging studies including chest x-ray: No acute cardiopulmonary process. Ultrasound of the abdomen reveals a 4 mm gallbladder wall polyp; however, no acute findings. CT of the abdomen reveals mild gallbladder thickening without pericholecystic fluid or inflammatory changes.   ASSESSMENT AND PLAN: A 39 year old Caucasian gentleman with a history of diabetes, substance abuse, presenting after a syncopal episode, found to be hypotensive in the Emergency Department requiring at least 4 L of normal saline boluses thus far to keep systolic blood pressure into the 80s. 1.  Drug overdose, suspect symptoms related to dextromethorphan as well as chlorphentermine, the components of Coricidin. This would explain both his hypotension as well as abdominal pain. The case was discussed by Emergency Room  staff and poison control. Recommendation of supportive care, provide intravenous fluid hydration, keep mean arterial pressure greater than 65. If required, will use pressor therapy. Will check a Tylenol level now, as unclear if his formulation includes acetaminophen or not. Avoid Haldol in this patient, as could predispose to serotonin syndrome. If agitation occurs, use benzodiazepines.  2.  Syncope, most likely related to above with dehydration. Provide intravenous fluid hydration. Place on telemetry.  3.  Acute kidney injury. Provide intravenous fluid hydration as stated above. Follow renal function and urine output.  4.  Type 2 diabetes. Hold p.o. agents. Add insulin sliding scale.  5.  Venous thromboembolism prophylaxis with heparin subcutaneous.  CODE STATUS: Patient is full code.   TIME SPENT: 45 minutes.    ____________________________ Cletis Athens. Hower,  MD dkh:ST Roach: 06/27/2014 22:49:56 ET T: 06/27/2014 23:49:45 ET JOB#: [id number]  cc: Cletis Athens. Hower, MD, <Dictator> DAVID Synetta Shadow MD ELECTRONICALLY SIGNED 06/28/2014 23:39

## 2015-01-31 NOTE — Consult Note (Addendum)
PATIENT NAME:  Randy Roach, Cowan D MR#:  [id number] DATE OF BIRTH:  [birth year]  DATE OF CONSULTATION:  01/13/2015  REFERRING PHYSICIAN:   CONSULTING PHYSICIAN:  Audery AmelJohn T. Beauty Pless, MD  IDENTIFYING INFORMATION AND REASON FOR CONSULTATION: A 39 year old man with a history of abuse of cough and cold medication, who comes into the hospital for altered mental status basis.   CHIEF COMPLAINT: "I woke up here."   HISTORY OF PRESENT ILLNESS: Information from the patient and the chart. He was evidently brought to the Emergency Room by someone who reported that he was not acting like his normal self. He had seemed to be "out of it." The patient does not remember coming to the hospital. He says the last thing he remembers before waking up here was that he was at home taking a shower. He denies that his mood has been particularly down or depressed. Totally denies any suicidal ideation. Reports that he took yesterday, as usual, 32 tablets of Coricidin Cough and Cold medication. He also admits that he drinks regularly. He is not sure if he was drinking yesterday, says that he usually only drinks on the weekend. He denies that he has been abusing any other drugs. Totally denies any suicidal ideation or homicidal ideation. Denies any psychotic symptoms. Has not recently been going to any treatment or making any effort to try and stop his drug abuse. Chronic stresses from being separated from his children and the problems the substance abuse causes him.   PAST PSYCHIATRIC HISTORY: The patient has a long history of abuse of cough and cold medicine going back several years. Longest sobriety was about a week or so. He used to go to [facility] years ago, but is not seeing anybody for psychiatric treatment now. He denies that he has ever actually tried to kill himself. He denies having psychotic symptoms.   SOCIAL HISTORY: Currently living with his aunt. Works at [employer] a couple of days a week. He has children from whom he is  somewhat estranged. Somewhat estranged from the rest of his family. Still has a driver's license.   PAST MEDICAL HISTORY: The patient has diabetes for which he takes metformin. Does not follow up with a doctor, does not check his blood sugars regularly.   FAMILY HISTORY: No known family history of mental health or substance abuse problems.  CURRENT MEDICATIONS: Metformin 500 mg twice a day.   ALLERGIES:  TEA.    SUBSTANCE ABUSE HISTORY: No history of withdrawal seizures or delirium tremens. Has abused marijuana, but otherwise mainly sticks to the cough and cold medicine.   REVIEW OF SYSTEMS:  Still feeling tired. No longer feeling confused. No hallucinations. No suicidal ideation. Physically no other specific physical complaints.   MENTAL STATUS EXAMINATION: Disheveled, poorly groomed gentleman who looks older than his stated age. Eye contact good. Psychomotor activity slow. Speech slow and quiet. Affect is flat. Mood stated as okay. Thoughts are lucid but slow. No evidence of delusions or loosening of associations. Denies auditory or visual hallucinations. Denies suicidal or homicidal ideation. He is alert and oriented x 4. Repeats 3 words immediately, remembers 3 words at 3 minutes. His judgment and insight currently are adequate but with his substance abuse chronically impaired.   LABORATORY RESULTS: EKG, sinus tachycardia at 113 done early this morning. Head CT normal. Chemistry panel with just a slightly elevated glucose of 136. CBC normal. Salicylates normal. Drug screen positive for phencyclidine.    VITAL SIGNS: Currently blood pressure 115/82, respirations  18, pulse down to 85, temperature 97.6.   ASSESSMENT: A 39 year old man addicted to over-the-counter cough and cold medicine, probably mainly the dextromethorphan component. Delirious yesterday, but probably back to pretty much his baseline today. No psychosis. No indication of suicidality. No indication of need for hospital level  treatment.   TREATMENT PLAN: Counseling and supportive therapy done with education about the risks of abuse of his medication and the availability of outpatient substance abuse treatment. The patient no longer meets commitment criteria and can be taken off commitment. Case discussed with Dr. Mindi Junker. The patient can be released from the Emergency Room. He will be referred to [facility] and strongly encouraged to follow up for substance abuse treatment.   DIAGNOSIS PRINCIPAL AND PRIMARY:   AXIS I: Delirium due to sedative intoxication, resolved.   SECONDARY DIAGNOSES:   AXIS I: Sedative abuse, severe.   AXIS II: Deferred.   AXIS III:  Diabetes.    ____________________________ Audery Amel, MD jtc:bu D: 01/13/2015 13:40:57 ET T: 01/13/2015 13:58:23 ET JOB#: [id number]  cc: Audery Amel, MD, <Dictator> Audery Amel MD ELECTRONICALLY SIGNED 02/05/2015 10:11

## 2015-02-20 ENCOUNTER — Encounter: Payer: Self-pay | Admitting: Emergency Medicine

## 2015-02-20 DIAGNOSIS — K047 Periapical abscess without sinus: Secondary | ICD-10-CM | POA: Insufficient documentation

## 2015-02-20 DIAGNOSIS — H538 Other visual disturbances: Secondary | ICD-10-CM | POA: Insufficient documentation

## 2015-02-20 DIAGNOSIS — Z720 Tobacco use: Secondary | ICD-10-CM | POA: Insufficient documentation

## 2015-02-20 DIAGNOSIS — Z792 Long term (current) use of antibiotics: Secondary | ICD-10-CM | POA: Insufficient documentation

## 2015-02-20 DIAGNOSIS — E119 Type 2 diabetes mellitus without complications: Secondary | ICD-10-CM | POA: Insufficient documentation

## 2015-02-20 DIAGNOSIS — Z79899 Other long term (current) drug therapy: Secondary | ICD-10-CM | POA: Insufficient documentation

## 2015-02-20 DIAGNOSIS — R070 Pain in throat: Secondary | ICD-10-CM | POA: Insufficient documentation

## 2015-02-20 DIAGNOSIS — R000 Tachycardia, unspecified: Secondary | ICD-10-CM | POA: Insufficient documentation

## 2015-02-20 LAB — GLUCOSE, CAPILLARY: GLUCOSE-CAPILLARY: 147 mg/dL — AB (ref 65–99)

## 2015-02-20 NOTE — ED Notes (Signed)
Pt reports lower right tootache. Pt has muffled speech, shaking hands. Pt in NAD, resp even and unlabored.

## 2015-02-20 NOTE — ED Notes (Signed)
Pt reports lower right tootache.

## 2015-02-21 ENCOUNTER — Emergency Department
Admission: EM | Admit: 2015-02-21 | Discharge: 2015-02-21 | Disposition: A | Discharge status: Home / Self Care | Payer: Self-pay | Attending: Emergency Medicine | Admitting: Emergency Medicine

## 2015-02-21 DIAGNOSIS — R070 Pain in throat: Secondary | ICD-10-CM

## 2015-02-21 DIAGNOSIS — K047 Periapical abscess without sinus: Secondary | ICD-10-CM

## 2015-02-21 HISTORY — DX: Type 2 diabetes mellitus without complications: E11.9

## 2015-02-21 LAB — POCT RAPID STREP A: STREPTOCOCCUS, GROUP A SCREEN (DIRECT): NEGATIVE

## 2015-02-21 MED ORDER — DEXAMETHASONE 1 MG/ML PO CONC
ORAL | Status: AC
  Administered 2015-02-21: 10 mg via ORAL
  Filled 2015-02-21: qty 1

## 2015-02-21 MED ORDER — DEXAMETHASONE 1 MG/ML PO CONC
10.0000 mg | Freq: Once | ORAL | Status: AC
  Administered 2015-02-21: 10 mg via ORAL

## 2015-02-21 MED ORDER — CEPHALEXIN 500 MG PO CAPS
ORAL_CAPSULE | ORAL | Status: AC
  Filled 2015-02-21: qty 1

## 2015-02-21 MED ORDER — CEPHALEXIN 500 MG PO CAPS
ORAL_CAPSULE | ORAL | Status: AC
  Administered 2015-02-21: 500 mg via ORAL
  Filled 2015-02-21: qty 1

## 2015-02-21 MED ORDER — CEPHALEXIN 500 MG PO CAPS: 500.0000 mg | ORAL_CAPSULE | Freq: Four times a day (QID) | ORAL | Status: AC

## 2015-02-21 MED ORDER — CEPHALEXIN 500 MG PO CAPS
500.0000 mg | ORAL_CAPSULE | Freq: Once | ORAL | Status: AC
  Administered 2015-02-21: 500 mg via ORAL

## 2015-02-21 NOTE — ED Provider Notes (Signed)
[facility] Emergency Department Provider Note  ____________________________________________  Time seen: Approximately 2:08 AM  I have reviewed the triage vital signs and the nursing notes.   HISTORY  Chief Complaint Dental Pain    HPI Randy Roach is a 39 y.o. male who comes in tonight reporting that he has an infection in his throat. The patient reports that one month ago he had an abscessed tooth which was removed. He reports that he was given antibiotics but reports that his throat became infected. He reports that he did take and finish the antibiotics. He reports that he has been having difficulty swallowing and inability to stick out his tongue. When asked he reports that the symptoms have been going on for over a year but reports that he is here because he needs antibiotics for an infection. He reports that it feels as though his throat is closing. His pain is a 5 out of 10 in intensity. The patient has not been taking anything at home for the pain or symptoms. He reports that he took so long to come in as he did not have insurance. Denies any fever or chills, no vomiting or nausea, no abdominal pain.   Past Medical History  Diagnosis Date  . Diabetes mellitus without complication     There are no active problems to display for this patient.   Past Surgical History  Procedure Laterality Date  . Appendectomy      Current Outpatient Rx  Name  Route  Sig  Dispense  Refill  . metFORMIN (GLUCOPHAGE) 1000 MG tablet   Oral   Take 500 mg by mouth 2 (two) times daily with a meal.         . cephALEXin (KEFLEX) 500 MG capsule   Oral   Take 1 capsule (500 mg total) by mouth 4 (four) times daily.   20 capsule   0     Allergies Review of patient's allergies indicates no known allergies.  No family history on file.  Social History History  Substance Use Topics  . Smoking status: Current Every Day Smoker  . Smokeless tobacco: Not on file  .  Alcohol Use: Yes    Review of Systems Constitutional: No fever/chills Eyes: Blurred vision ENT: sore throat. Cardiovascular: Denies chest pain. Respiratory: Denies shortness of breath. Gastrointestinal: No abdominal pain.  No nausea, no vomiting.   Genitourinary: Negative for dysuria. Musculoskeletal: Negative for back pain. Skin: Negative for rash. Neurological: Numbness behind his left ear, his left head and his left shoulder.  10-point ROS otherwise negative.  ____________________________________________   PHYSICAL EXAM:  VITAL SIGNS: ED Triage Vitals  Enc Vitals Group     BP 02/20/15 2218 155/107 mmHg     Pulse Rate 02/20/15 2218 143     Resp 02/20/15 2218 22     Temp 02/20/15 2218 98.5 F (36.9 C)     Temp Source 02/20/15 2218 Oral     SpO2 02/20/15 2218 94 %     Weight 02/20/15 2218 135 lb (61.236 kg)     Height 02/20/15 2218  (1.702 m)     Head Cir --      Peak Flow --      Pain Score 02/20/15 2219 5     Pain Loc --      Pain Edu? --      Excl. in GC? --     Constitutional: Alert and oriented. Well appearing and in mild distress. Eyes: Conjunctivae are  normal. PERRL. EOMI. Head: Atraumatic. Nose: No congestion/rhinnorhea. Mouth/Throat: Mucous membranes are moist.  Oropharynx non-erythematous. Neck: No stridor.  Hematological/Lymphatic/Immunilogical: Bilateral cervical lymphadenopathy. Cardiovascular: Tachycardia, regular rhythm. Grossly normal heart sounds.  Good peripheral circulation. Respiratory: Normal respiratory effort.  No retractions. Lungs CTAB. Gastrointestinal: Soft and nontender. No distention.  Genitourinary: Deferred Musculoskeletal: No lower extremity tenderness nor edema.  Neurologic:  Normal speech and language. No gross focal neurologic deficits are appreciated.  Skin:  Skin is warm, dry and intact. No rash noted. Psychiatric: Mood and affect are normal. Speech and behavior are  normal.  ____________________________________________   LABS (all labs ordered are listed, but only abnormal results are displayed)  Labs Reviewed  GLUCOSE, CAPILLARY - Abnormal; Notable for the following:    Glucose-Capillary 147 (*)    All other components within normal limits  POCT RAPID STREP A   ____________________________________________  EKG  None ____________________________________________  RADIOLOGY  None ____________________________________________   PROCEDURES  Procedure(s) performed: None  Critical Care performed: No  ____________________________________________   INITIAL IMPRESSION / ASSESSMENT AND PLAN / ED COURSE  Pertinent labs & imaging results that were available during my care of the patient were reviewed by me and considered in my medical decision making (see chart for details).  The patient is a 39 year old male who comes in with sore throat and concern for infection of his throat. The patient reports that he does have some right sided toothache. I will give the patient a dose of Decadron and some oral hydration. We will do a strep swab of his throat to evaluate for infection and then reassess the patient.  The patient's throat swab was negative. I will give the patient a dose of Keflex as he does have multiple dental caries and dental disease in his mouth. I'll discharge the patient [facility] or his own dental clinic. The patient agrees at this plan and will be discharged home. ____________________________________________   FINAL CLINICAL IMPRESSION(S) / ED DIAGNOSES  Final diagnoses:  Throat pain  Dental infection      Rebecka ApleyAllison P Webster, MD 02/21/15 [phone]

## 2015-02-21 NOTE — Discharge Instructions (Signed)
Abscessed Tooth An abscessed tooth is an infection around your tooth. It may be caused by holes or damage to the tooth (cavity) or a dental disease. An abscessed tooth causes mild to very bad pain in and around the tooth. See your dentist right away if you have tooth or gum pain. HOME CARE  Take your medicine as told. Finish it even if you start to feel better.  Do not drive after taking pain medicine.  Rinse your mouth (gargle) often with salt water ( teaspoon salt in 8 ounces of warm water).  Do not apply heat to the outside of your face. GET HELP RIGHT AWAY IF:   You have a temperature by mouth above 102 F (38.9 C), not controlled by medicine.  You have chills and a very bad headache.  You have problems breathing or swallowing.  Your mouth will not open.  You develop puffiness (swelling) on the neck or around the eye.  Your pain is not helped by medicine.  Your pain is getting worse instead of better. MAKE SURE YOU:   Understand these instructions.  Will watch your condition.  Will get help right away if you are not doing well or get worse. Document Released: 03/06/2008 Document Revised: 12/11/2011 Document Reviewed: 12/27/2010 [employer] Patient Information 2015 [employer]. This information is not intended to replace advice given to you by your health care provider. Make sure you discuss any questions you have with your health care provider.  

## 2015-02-24 LAB — CULTURE, GROUP A STREP (THRC)

## 2015-04-26 ENCOUNTER — Emergency Department: Payer: Self-pay

## 2015-04-26 ENCOUNTER — Encounter: Payer: Self-pay | Admitting: Medical Oncology

## 2015-04-26 ENCOUNTER — Other Ambulatory Visit: Payer: Self-pay

## 2015-04-26 ENCOUNTER — Emergency Department
Admission: EM | Admit: 2015-04-26 | Discharge: 2015-04-26 | Disposition: A | Discharge status: Home / Self Care | Payer: Self-pay | Attending: Emergency Medicine | Admitting: Emergency Medicine

## 2015-04-26 DIAGNOSIS — E1165 Type 2 diabetes mellitus with hyperglycemia: Secondary | ICD-10-CM | POA: Insufficient documentation

## 2015-04-26 DIAGNOSIS — E860 Dehydration: Secondary | ICD-10-CM | POA: Insufficient documentation

## 2015-04-26 DIAGNOSIS — Z720 Tobacco use: Secondary | ICD-10-CM | POA: Insufficient documentation

## 2015-04-26 DIAGNOSIS — Z79899 Other long term (current) drug therapy: Secondary | ICD-10-CM | POA: Insufficient documentation

## 2015-04-26 DIAGNOSIS — R739 Hyperglycemia, unspecified: Secondary | ICD-10-CM

## 2015-04-26 LAB — CK: CK TOTAL: 86 U/L (ref 49–397)

## 2015-04-26 LAB — CBC WITH DIFFERENTIAL/PLATELET
BASOS PCT: 0 %
Basophils Absolute: 0 10*3/uL (ref 0–0.1)
Eosinophils Absolute: 0 10*3/uL (ref 0–0.7)
Eosinophils Relative: 0 %
HCT: 49.3 % (ref 40.0–52.0)
HEMOGLOBIN: 16.6 g/dL (ref 13.0–18.0)
Lymphocytes Relative: 15 %
Lymphs Abs: 2.4 10*3/uL (ref 1.0–3.6)
MCH: 30.7 pg (ref 26.0–34.0)
MCHC: 33.7 g/dL (ref 32.0–36.0)
MCV: 90.9 fL (ref 80.0–100.0)
Monocytes Absolute: 1.1 10*3/uL — ABNORMAL HIGH (ref 0.2–1.0)
Monocytes Relative: 7 %
Neutro Abs: 12 10*3/uL — ABNORMAL HIGH (ref 1.4–6.5)
Neutrophils Relative %: 78 %
PLATELETS: 317 10*3/uL (ref 150–440)
RBC: 5.43 MIL/uL (ref 4.40–5.90)
RDW: 12.4 % (ref 11.5–14.5)
WBC: 15.6 10*3/uL — ABNORMAL HIGH (ref 3.8–10.6)

## 2015-04-26 LAB — URINALYSIS COMPLETE WITH MICROSCOPIC (ARMC ONLY)
BILIRUBIN URINE: NEGATIVE
Bacteria, UA: NONE SEEN
Glucose, UA: >500 mg/dL — AB
Hgb urine dipstick: NEGATIVE
Ketones, ur: NEGATIVE mg/dL
LEUKOCYTES UA: NEGATIVE
Nitrite: NEGATIVE
PH: 5 (ref 5.0–8.0)
PROTEIN: NEGATIVE mg/dL
Specific Gravity, Urine: 1.032 — ABNORMAL HIGH (ref 1.005–1.030)

## 2015-04-26 LAB — COMPREHENSIVE METABOLIC PANEL
ALBUMIN: 4.5 g/dL (ref 3.5–5.0)
ALT: 16 U/L — AB (ref 17–63)
AST: 18 U/L (ref 15–41)
Alkaline Phosphatase: 56 U/L (ref 38–126)
Anion gap: 12 (ref 5–15)
BILIRUBIN TOTAL: 0.2 mg/dL — AB (ref 0.3–1.2)
BUN: 16 mg/dL (ref 6–20)
CALCIUM: 10 mg/dL (ref 8.9–10.3)
CO2: 31 mmol/L (ref 22–32)
Chloride: 98 mmol/L — ABNORMAL LOW (ref 101–111)
Creatinine, Ser: 1.68 mg/dL — ABNORMAL HIGH (ref 0.61–1.24)
GFR calc non Af Amer: 51 mL/min — ABNORMAL LOW (ref >60)
GFR, EST AFRICAN AMERICAN: 59 mL/min — AB (ref >60)
Glucose, Bld: 272 mg/dL — ABNORMAL HIGH (ref 65–99)
Potassium: 3.6 mmol/L (ref 3.5–5.1)
SODIUM: 141 mmol/L (ref 135–145)
Total Protein: 7.8 g/dL (ref 6.5–8.1)

## 2015-04-26 LAB — ETHANOL: Alcohol, Ethyl (B): <5 mg/dL (ref <5)

## 2015-04-26 LAB — LIPASE, BLOOD: Lipase: 27 U/L (ref 22–51)

## 2015-04-26 MED ORDER — IOHEXOL 300 MG/ML  SOLN
75.0000 mL | Freq: Once | INTRAMUSCULAR | Status: AC | PRN
Start: 2015-04-26 — End: 2015-04-26
  Administered 2015-04-26: 75 mL via INTRAVENOUS

## 2015-04-26 MED ORDER — SODIUM CHLORIDE 0.9 % IV BOLUS (SEPSIS)
1000.0000 mL | Freq: Once | INTRAVENOUS | Status: AC
  Administered 2015-04-26: 1000 mL via INTRAVENOUS

## 2015-04-26 MED ORDER — SODIUM CHLORIDE 0.9 % IV BOLUS (SEPSIS)
2000.0000 mL | Freq: Once | INTRAVENOUS | Status: AC
  Administered 2015-04-26: 2000 mL via INTRAVENOUS

## 2015-04-26 NOTE — Discharge Instructions (Signed)
Your CT scan of the head and neck were unremarkable today. Your chest x-ray was also unremarkable, and your blood and urine tests did not reveal any serious issues other than dehydration. Be sure to drink plenty of water and other fluids especially when being outside exposed to the extreme heat. Follow-up with your doctor for continued monitoring of your symptoms  Dehydration, Adult Dehydration is when you lose more fluids from the body than you take in. Vital organs like the kidneys, brain, and heart cannot function without a proper amount of fluids and salt. Any loss of fluids from the body can cause dehydration.  CAUSES   Vomiting.  Diarrhea.  Excessive sweating.  Excessive urine output.  Fever. SYMPTOMS  Mild dehydration  Thirst.  Dry lips.  Slightly dry mouth. Moderate dehydration  Very dry mouth.  Sunken eyes.  Skin does not bounce back quickly when lightly pinched and released.  Dark urine and decreased urine production.  Decreased tear production.  Headache. Severe dehydration  Very dry mouth.  Extreme thirst.  Rapid, weak pulse (more than 100 beats per minute at rest).  Cold hands and feet.  Not able to sweat in spite of heat and temperature.  Rapid breathing.  Blue lips.  Confusion and lethargy.  Difficulty being awakened.  Minimal urine production.  No tears. DIAGNOSIS  Your caregiver will diagnose dehydration based on your symptoms and your exam. Blood and urine tests will help confirm the diagnosis. The diagnostic evaluation should also identify the cause of dehydration. TREATMENT  Treatment of mild or moderate dehydration can often be done at home by increasing the amount of fluids that you drink. It is best to drink small amounts of fluid more often. Drinking too much at one time can make vomiting worse. Refer to the home care instructions below. Severe dehydration needs to be treated at the hospital where you will probably be given  intravenous (IV) fluids that contain water and electrolytes. HOME CARE INSTRUCTIONS   Ask your caregiver about specific rehydration instructions.  Drink enough fluids to keep your urine clear or pale yellow.  Drink small amounts frequently if you have nausea and vomiting.  Eat as you normally do.  Avoid:  Foods or drinks high in sugar.  Carbonated drinks.  Juice.  Extremely hot or cold fluids.  Drinks with caffeine.  Fatty, greasy foods.  Alcohol.  Tobacco.  Overeating.  Gelatin desserts.  Wash your hands well to avoid spreading bacteria and viruses.  Only take over-the-counter or prescription medicines for pain, discomfort, or fever as directed by your caregiver.  Ask your caregiver if you should continue all prescribed and over-the-counter medicines.  Keep all follow-up appointments with your caregiver. SEEK MEDICAL CARE IF:  You have abdominal pain and it increases or stays in one area (localizes).  You have a rash, stiff neck, or severe headache.  You are irritable, sleepy, or difficult to awaken.  You are weak, dizzy, or extremely thirsty. SEEK IMMEDIATE MEDICAL CARE IF:   You are unable to keep fluids down or you get worse despite treatment.  You have frequent episodes of vomiting or diarrhea.  You have blood or green matter (bile) in your vomit.  You have blood in your stool or your stool looks black and tarry.  You have not urinated in 6 to 8 hours, or you have only urinated a small amount of very dark urine.  You have a fever.  You faint. MAKE SURE YOU:   Understand these instructions.  Will watch your condition.  Will get help right away if you are not doing well or get worse. Document Released: 09/18/2005 Document Revised: 12/11/2011 Document Reviewed: 05/08/2011 [facility] Patient Information 2015 [place], [place]. This information is not intended to replace advice given to you by your health care provider. Make sure you discuss any  questions you have with your health care provider.  High Blood Sugar High blood sugar (hyperglycemia) means that the level of sugar in your blood is higher than it should be. Signs of high blood sugar include:  Feeling thirsty.  Frequent peeing (urinating).  Feeling tired or sleepy.  Dry mouth.  Vision changes.  Feeling weak.  Feeling hungry but losing weight.  Numbness and tingling in your hands or feet.  Headache. When you ignore these signs, your blood sugar may keep going up. These problems may get worse, and other problems may begin. HOME CARE  Check your blood sugars as told by your doctor. Write down the numbers with the date and time.  Take the right amount of insulin or diabetes pills at the right time. Write down the dose with date and time.  Refill your insulin or diabetes pills before running out.  Watch what you eat. Follow your meal plan.  Drink liquids without sugar, such as water. Check with your doctor if you have kidney or heart disease.  Follow your doctor's orders for exercise. Exercise at the same time of day.  Keep your doctor's appointments. GET HELP RIGHT AWAY IF:   You have trouble thinking or are confused.  You have fast breathing with fruity smelling breath.  You pass out (faint).  You have 2 to 3 days of high blood sugars and you do not know why.  You have chest pain.  You are feeling sick to your stomach (nauseous) or throwing up (vomiting).  You have sudden vision changes. MAKE SURE YOU:   Understand these instructions.  Will watch your condition.  Will get help right away if you are not doing well or get worse. Document Released: 07/16/2009 Document Revised: 12/11/2011 Document Reviewed: 07/16/2009 [facility] Patient Information 2015 [place], [place]. This information is not intended to replace advice given to you by your health care provider. Make sure you discuss any questions you have with your health care provider.

## 2015-04-26 NOTE — ED Provider Notes (Signed)
[facility] Emergency Department Provider Note  ____________________________________________  Time seen: 3:20 PM on arrival by EMS  I have reviewed the triage vital signs and the nursing notes.   HISTORY  Chief Complaint Seizures    HPI Randy Roach is a 39 y.o. male is reported to have a fall and seizure-like activity today. He notes that he had been walking for an extended period of time outside in the heat when the ambient temperature was reports of 95 and sunny. He walked into a fast food restaurant where he fell on the floor and appeared to have some convulsions. This resolved spontaneously that the patient was confused afterward.He reports this is never happened before. No preceding chest pain shortness of breath back pain abdominal pain nausea around. Vision changes headache or lightheadedness.  He does report chronic muffled voice after a with some tooth extraction 3 months ago. He also states that is been hard to swallow since then.  Reports compliance with all of his home medications   Past Medical History  Diagnosis Date  . Diabetes mellitus without complication     There are no active problems to display for this patient.   Past Surgical History  Procedure Laterality Date  . Appendectomy      Current Outpatient Rx  Name  Route  Sig  Dispense  Refill  . Chlorpheniramine-DM (CORICIDIN HBP COUGH/COLD PO)   Oral   Take by mouth See admin instructions. Pt states that he took 32 tablets this morning.  (04/26/15)         . metFORMIN (GLUCOPHAGE) 1000 MG tablet   Oral   Take 500 mg by mouth 2 (two) times daily.            Allergies Review of patient's allergies indicates no known allergies.  No family history on file.  Social History History  Substance Use Topics  . Smoking status: Current Every Day Smoker -- 1.00 packs/day    Types: Cigarettes  . Smokeless tobacco: Not on file  . Alcohol Use: Yes     Comment: occ     Review of Systems  Constitutional: No fever or chills. No weight changes Eyes:No blurry vision or double vision.  ENT: Dysphonia and dysphagia, chronic. Cardiovascular: No chest pain. Respiratory: No dyspnea or cough. Gastrointestinal: Negative for abdominal pain, vomiting and diarrhea.  No BRBPR or melena. Genitourinary: Negative for dysuria, urinary retention, bloody urine, or difficulty urinating. Musculoskeletal: Negative for back pain. No joint swelling or pain. Skin: Negative for rash. Neurological: Negative for headaches, focal weakness or numbness. Psychiatric:No anxiety or depression.   Endocrine:No hot/cold intolerance, changes in energy, or sleep difficulty.  10-point ROS otherwise negative.  ____________________________________________   PHYSICAL EXAM:  VITAL SIGNS: ED Triage Vitals  Enc Vitals Group     BP 04/26/15 1521 101/78 mmHg     Pulse Rate 04/26/15 1521 136     Resp 04/26/15 1521 21     Temp 04/26/15 1521 97.8 F (36.6 C)     Temp Source 04/26/15 1521 Oral     SpO2 04/26/15 1521 96 %     Weight 04/26/15 1521 135 lb (61.236 kg)     Height 04/26/15 1521 5\' 7"  (1.702 m)     Head Cir --      Peak Flow --      Pain Score 04/26/15 1528 3     Pain Loc --      Pain Edu? --      Excl.  in GC? --      Constitutional: Alert and oriented. Well appearing and in no distress. Eyes: No scleral icterus. No conjunctival pallor. PERRL. EOMI ENT   Head: Normocephalic and atraumatic.   Nose: No congestion/rhinnorhea. No septal hematoma   Mouth/Throat: Dry mucous membranes, no pharyngeal erythema. No peritonsillar mass. No uvula shift.   Neck: No stridor. No SubQ emphysema. No meningismus. Hematological/Lymphatic/Immunilogical: No cervical lymphadenopathy. Cardiovascular: Tachycardia heart rate 135. Normal and symmetric distal pulses are present in all extremities. No murmurs, rubs, or gallops. Respiratory: Normal respiratory effort without  tachypnea nor retractions. Breath sounds are clear and equal bilaterally. No wheezes/rales/rhonchi. Gastrointestinal: Soft and nontender. No distention. There is no CVA tenderness.  No rebound, rigidity, or guarding. Genitourinary: deferred Musculoskeletal: Nontender with normal range of motion in all extremities. No joint effusions.  No lower extremity tenderness.  No edema. Neurologic:   Normal speech and language.  CN 2-10 normal. Motor grossly intact. No pronator drift.  Normal gait. No gross focal neurologic deficits are appreciated.  Skin:  Skin is warm, dry and intact. No rash noted.  No petechiae, purpura, or bullae. Psychiatric: Mood and affect are normal. Speech and behavior are normal. Patient exhibits appropriate insight and judgment.  ____________________________________________    LABS (pertinent positives/negatives) (all labs ordered are listed, but only abnormal results are displayed) Labs Reviewed  COMPREHENSIVE METABOLIC PANEL - Abnormal; Notable for the following:    Chloride 98 (*)    Glucose, Bld 272 (*)    Creatinine, Ser 1.68 (*)    ALT 16 (*)    Total Bilirubin 0.2 (*)    GFR calc non Af Amer 51 (*)    GFR calc Af Amer 59 (*)    All other components within normal limits  CBC WITH DIFFERENTIAL/PLATELET - Abnormal; Notable for the following:    WBC 15.6 (*)    Neutro Abs 12.0 (*)    Monocytes Absolute 1.1 (*)    All other components within normal limits  URINALYSIS COMPLETEWITH MICROSCOPIC ([facility] ONLY) - Abnormal; Notable for the following:    Color, Urine YELLOW (*)    APPearance HAZY (*)    Glucose, UA >500 (*)    Specific Gravity, Urine 1.032 (*)    Squamous Epithelial / LPF 0-5 (*)    All other components within normal limits  ETHANOL  LIPASE, BLOOD  CK   ____________________________________________   EKG  Interpreted by me Sinus tachycardia rate 133, normal axis intervals QRS and ST segments and T  waves  ____________________________________________    RADIOLOGY  CT head unremarkable Chest x-ray unremarkable X-ray soft tissue neck suggestive of prevertebral swelling at C5-C6 CT neck with contrast unremarkable  ____________________________________________   PROCEDURES  ____________________________________________   INITIAL IMPRESSION / ASSESSMENT AND PLAN / ED COURSE  Pertinent labs & imaging results that were available during my care of the patient were reviewed by me and considered in my medical decision making (see chart for details).  Patient presents with tachycardia and exam consistent with dehydration in the setting of prolonged heat exposure. We'll give him vigorous IV fluid rehydration while checking labs and CT had an x-ray of the neck or the possible new seizure and the dysphonia.  ----------------------------------------- 6:50 PM on 04/26/2015 -----------------------------------------  Workup unremarkable except for mild elevation of creatinine consistent with dehydration and pre- Renal state. Soft tissue neck x-ray suggestive of prevertebral soft tissue swelling, so he followed this with a CT scan which was unremarkable. No evidence of any abscess  of any kind. Patient is well-appearing, tachycardia is much improved down to 110 with IV fluids. Other vital signs remained stable. No evidence of infection or sepsis, ACS PE TAD pneumothorax. We'll discharge the patient home to follow with primary care  ____________________________________________   FINAL CLINICAL IMPRESSION(S) / ED DIAGNOSES  Final diagnoses:  Hyperglycemia  Dehydration, severe      Sharman Cheek, MD 04/26/15 [phone]

## 2015-04-26 NOTE — ED Notes (Signed)
Pt was picked up by ems from [facility] where staff reports pt fell in the floor with seizure like activity. Ems reports pt was post ictal at scene. Pt reports miles of walking today in the heat. Denies hx of seizures. Denies pain.

## 2015-08-20 DIAGNOSIS — R1312 Dysphagia, oropharyngeal phase: Secondary | ICD-10-CM | POA: Insufficient documentation

## 2015-09-16 DIAGNOSIS — G1220 Motor neuron disease, unspecified: Secondary | ICD-10-CM | POA: Insufficient documentation

## 2015-11-26 ENCOUNTER — Emergency Department: Payer: Medicaid Other

## 2015-11-26 ENCOUNTER — Emergency Department
Admission: EM | Admit: 2015-11-26 | Discharge: 2015-11-26 | Disposition: A | Discharge status: Home / Self Care | Payer: Medicaid Other | Attending: Emergency Medicine | Admitting: Emergency Medicine

## 2015-11-26 ENCOUNTER — Ambulatory Visit
Admit: 2015-11-26 | Discharge: 2015-11-26 | Disposition: A | Discharge status: Home / Self Care | Payer: Medicaid Other | Attending: Emergency Medicine | Admitting: Emergency Medicine

## 2015-11-26 ENCOUNTER — Ambulatory Visit: Admission: EM | Admit: 2015-11-26 | Payer: Self-pay | Source: Ambulatory Visit | Admitting: Interventional Radiology

## 2015-11-26 ENCOUNTER — Encounter: Payer: Self-pay | Admitting: Emergency Medicine

## 2015-11-26 DIAGNOSIS — Z7984 Long term (current) use of oral hypoglycemic drugs: Secondary | ICD-10-CM | POA: Diagnosis not present

## 2015-11-26 DIAGNOSIS — F17210 Nicotine dependence, cigarettes, uncomplicated: Secondary | ICD-10-CM | POA: Diagnosis not present

## 2015-11-26 DIAGNOSIS — R531 Weakness: Secondary | ICD-10-CM | POA: Insufficient documentation

## 2015-11-26 DIAGNOSIS — E119 Type 2 diabetes mellitus without complications: Secondary | ICD-10-CM | POA: Diagnosis not present

## 2015-11-26 DIAGNOSIS — K9423 Gastrostomy malfunction: Secondary | ICD-10-CM | POA: Insufficient documentation

## 2015-11-26 DIAGNOSIS — Z79899 Other long term (current) drug therapy: Secondary | ICD-10-CM | POA: Diagnosis not present

## 2015-11-26 DIAGNOSIS — K9420 Gastrostomy complication, unspecified: Secondary | ICD-10-CM

## 2015-11-26 DIAGNOSIS — Z931 Gastrostomy status: Secondary | ICD-10-CM

## 2015-11-26 HISTORY — DX: Amyotrophic lateral sclerosis: G12.21

## 2015-11-26 LAB — BASIC METABOLIC PANEL
Anion gap: 7 (ref 5–15)
BUN: 7 mg/dL (ref 6–20)
CO2: 31 mmol/L (ref 22–32)
Calcium: 8.6 mg/dL — ABNORMAL LOW (ref 8.9–10.3)
Chloride: 95 mmol/L — ABNORMAL LOW (ref 101–111)
Creatinine, Ser: 0.34 mg/dL — ABNORMAL LOW (ref 0.61–1.24)
GFR calc Af Amer: >60 mL/min (ref >60)
GFR calc non Af Amer: >60 mL/min (ref >60)
GLUCOSE: 102 mg/dL — AB (ref 65–99)
Potassium: 4.5 mmol/L (ref 3.5–5.1)
Sodium: 133 mmol/L — ABNORMAL LOW (ref 135–145)

## 2015-11-26 LAB — CBC WITH DIFFERENTIAL/PLATELET
Basophils Absolute: 0 10*3/uL (ref 0–0.1)
Basophils Relative: 0 %
Eosinophils Absolute: 0.1 10*3/uL (ref 0–0.7)
Eosinophils Relative: 2 %
HEMATOCRIT: 42.6 % (ref 40.0–52.0)
Hemoglobin: 14.6 g/dL (ref 13.0–18.0)
LYMPHS ABS: 1.9 10*3/uL (ref 1.0–3.6)
LYMPHS PCT: 35 %
MCH: 31 pg (ref 26.0–34.0)
MCHC: 34.4 g/dL (ref 32.0–36.0)
MCV: 90.1 fL (ref 80.0–100.0)
MONOS PCT: 8 %
Monocytes Absolute: 0.4 10*3/uL (ref 0.2–1.0)
NEUTROS ABS: 2.9 10*3/uL (ref 1.4–6.5)
Neutrophils Relative %: 55 %
Platelets: 331 10*3/uL (ref 150–440)
RBC: 4.72 MIL/uL (ref 4.40–5.90)
RDW: 13.4 % (ref 11.5–14.5)
WBC: 5.3 10*3/uL (ref 3.8–10.6)

## 2015-11-26 LAB — PROTIME-INR
INR: 0.94
Prothrombin Time: 12.8 seconds (ref 11.4–15.0)

## 2015-11-26 MED ORDER — FENTANYL CITRATE (PF) 100 MCG/2ML IJ SOLN
INTRAMUSCULAR | Status: AC
  Filled 2015-11-26: qty 2

## 2015-11-26 MED ORDER — FENTANYL CITRATE (PF) 100 MCG/2ML IJ SOLN
INTRAMUSCULAR | Status: AC | PRN
  Administered 2015-11-26: 50 ug via INTRAVENOUS
  Administered 2015-11-26 (×3): 25 ug via INTRAVENOUS

## 2015-11-26 MED ORDER — KETOROLAC TROMETHAMINE 60 MG/2ML IM SOLN
INTRAMUSCULAR | Status: AC
  Filled 2015-11-26: qty 2

## 2015-11-26 MED ORDER — BACITRACIN-NEOMYCIN-POLYMYXIN 400-5-5000 EX OINT
1.0000 "application " | TOPICAL_OINTMENT | Freq: Every day | CUTANEOUS | Status: DC
  Filled 2015-11-26: qty 1

## 2015-11-26 MED ORDER — MIDAZOLAM HCL 5 MG/5ML IJ SOLN
INTRAMUSCULAR | Status: AC
  Filled 2015-11-26: qty 10

## 2015-11-26 MED ORDER — KETOROLAC TROMETHAMINE 30 MG/ML IJ SOLN
30.0000 mg | Freq: Once | INTRAMUSCULAR | Status: AC
  Administered 2015-11-26: 30 mg via INTRAVENOUS
  Filled 2015-11-26: qty 1

## 2015-11-26 MED ORDER — LIDOCAINE VISCOUS 2 % MT SOLN
OROMUCOSAL | Status: AC
  Administered 2015-11-26: 09:00:00
  Filled 2015-11-26: qty 15

## 2015-11-26 MED ORDER — IOHEXOL 300 MG/ML  SOLN
30.0000 mL | Freq: Once | INTRAMUSCULAR | Status: AC | PRN
  Administered 2015-11-26: 10 mL

## 2015-11-26 MED ORDER — SODIUM CHLORIDE 0.9 % IV SOLN
INTRAVENOUS | Status: DC
  Administered 2015-11-26: 14:00:00 via INTRAVENOUS

## 2015-11-26 MED ORDER — FENTANYL CITRATE (PF) 100 MCG/2ML IJ SOLN
50.0000 ug | Freq: Once | INTRAMUSCULAR | Status: AC
Start: 2015-11-26 — End: 2015-11-26
  Administered 2015-11-26: 50 ug via INTRAVENOUS

## 2015-11-26 MED ORDER — CEFAZOLIN SODIUM-DEXTROSE 2-3 GM-% IV SOLR
2.0000 g | Freq: Once | INTRAVENOUS | Status: AC
  Administered 2015-11-26: 2 g via INTRAVENOUS
  Filled 2015-11-26: qty 50

## 2015-11-26 MED ORDER — SODIUM CHLORIDE 0.9 % IV BOLUS (SEPSIS)
500.0000 mL | Freq: Once | INTRAVENOUS | Status: AC
  Administered 2015-11-26: 500 mL via INTRAVENOUS

## 2015-11-26 MED ORDER — MIDAZOLAM HCL 5 MG/5ML IJ SOLN
INTRAMUSCULAR | Status: AC | PRN
  Administered 2015-11-26: 0.5 mg via INTRAVENOUS
  Administered 2015-11-26 (×2): 1 mg via INTRAVENOUS
  Administered 2015-11-26: 0.5 mg via INTRAVENOUS
  Administered 2015-11-26: 1 mg via INTRAVENOUS
  Administered 2015-11-26: 0.5 mg via INTRAVENOUS

## 2015-11-26 MED ORDER — LIDOCAINE HCL (PF) 1 % IJ SOLN
INTRAMUSCULAR | Status: AC
  Filled 2015-11-26: qty 10

## 2015-11-26 MED ORDER — IOHEXOL 300 MG/ML  SOLN
50.0000 mL | Freq: Once | INTRAMUSCULAR | Status: AC | PRN
  Administered 2015-11-26: 50 mL

## 2015-11-26 NOTE — Progress Notes (Signed)
Patient/family education- (see MD orders)  1.  For 7 days, cleanse site with soap and water, apply triple-antibiotic ointment and cover with gauze daily for 7 days. 2.  Use J port only for liquid medications/feedings. (No crushed or particulate matter.)

## 2015-11-26 NOTE — ED Provider Notes (Signed)
Time Seen: Approximately *0 740 I have reviewed the triage notes  Chief Complaint: PEG Tub dislogement   History of Present Illness: Randy Roach is a 40 y.o. male *who presents via EMS with a history of ALS with chronic G-tube feeding. Patient had his G-tube placed in January. The patient's feeding tube Pulled out by accident. The patient's last tube feeding was last night. Dislodgment occurred this morning. Patient hasn't had any fever, chest pain etc. Patient's historians are mainly his family as he is nonverbal from his ALS.   Past Medical History  Diagnosis Date  . Diabetes mellitus without complication (HCC)   . ALS (amyotrophic lateral sclerosis) (HCC)     There are no active problems to display for this patient.   Past Surgical History  Procedure Laterality Date  . Appendectomy    . Peg tube placement Left     Past Surgical History  Procedure Laterality Date  . Appendectomy    . Peg tube placement Left     Current Outpatient Rx  Name  Route  Sig  Dispense  Refill  . magnesium oxide (MAG-OX) 400 MG tablet   Oral   Take 400 mg by mouth 2 (two) times daily.         . Multiple Vitamins-Minerals (MULTIVITAMINS THER. W/MINERALS) TABS tablet   Oral   Take 1 tablet by mouth daily.         . Chlorpheniramine-DM (CORICIDIN HBP COUGH/COLD PO)   Oral   Take by mouth See admin instructions. Pt states that he took 32 tablets this morning.  (04/26/15)         . metFORMIN (GLUCOPHAGE) 1000 MG tablet   Oral   Take 500 mg by mouth 2 (two) times daily.            Allergies:  Review of patient's allergies indicates no known allergies.  Family History: History reviewed. No pertinent family history.  Social History: Social History  Substance Use Topics  . Smoking status: Current Every Day Smoker -- 1.00 packs/day    Types: Cigarettes  . Smokeless tobacco: None  . Alcohol Use: No     Comment: occ     Review of Systems:   10 point review of systems  was performed and was otherwise negative:  Constitutional: No fever Eyes: No visual disturbances ENT: No sore throat, ear pain Cardiac: No chest pain Respiratory: No shortness of breath, wheezing, or stridor Abdomen: No abdominal pain, no vomiting, No diarrhea Endocrine: No weight loss, No night sweats Extremities: No peripheral edema, cyanosis Skin: No rashes, easy bruising Neurologic: No new weakness, trouble with speech or swollowing Urologic: No dysuria, Hematuria, or urinary frequency   Physical Exam:  ED Triage Vitals  Enc Vitals Group     BP 11/26/15 0626 112/85 mmHg     Pulse Rate 11/26/15 0626 113     Resp 11/26/15 0626 20     Temp 11/26/15 0626 97.7 F (36.5 C)     Temp Source 11/26/15 0626 Oral     SpO2 11/26/15 0626 97 %     Weight 11/26/15 0626 107 lb (48.535 kg)     Height 11/26/15 0626  (1.753 m)     Head Cir --      Peak Flow --      Pain Score 11/26/15 0627 6     Pain Loc --      Pain Edu? --      Excl. in GC? --  General: Awake , Alert , and Oriented times 3; cooperative, nonverbal Head: Normal cephalic , atraumatic Eyes: Pupils equal , round, reactive to light Nose/Throat: No nasal drainage, patent upper airway without erythema or exudate.  Neck: Supple, Full range of motion, No anterior adenopathy or palpable thyroid masses Lungs: Clear to ascultation without wheezes , rhonchi, or rales Heart: Regular rate, regular rhythm without murmurs , gallops , or rubs Abdomen: Soft, non tender without rebound, guarding , or rigidity; bowel sounds positive and symmetric in all 4 quadrants. No organomegaly .  Visible G-tube hole left upper abdominal region. No active bleeding.    Extremities: 2 plus symmetric pulses. No edema, clubbing or cyanosis Neurologic: Generalized weakness and the patient's currently nonambulatory. Skin: warm, dry, no rashes   Labs:  We will do a fingerstick prior to discharge    Radiology: * CLINICAL DATA: Evaluate  gastrostomy tube placement  EXAM: ABDOMEN - 1 VIEW  COMPARISON: None.  FINDINGS: Contrast has been injected via left upper abdominal quadrant enteric tube, however there no definitive opacification of the stomach, rather than is extravasation of contrast about the left upper abdominal quadrant and about the midline and caudal aspects of the left hemidiaphragm.  Nonobstructive bowel gas pattern.  No supine evidence of pneumoperitoneum. No pneumatosis or portal venous gas.  No acute osseus abnormalities.  IMPRESSION: Malpositioned enteric tube with extravasation of contrast about the left upper abdominal quadrant. Fluoroscopic guided enteric tube replacement is recommended.  These results will be called to the ordering clinician or representative by the Radiologist Assistant, and communication documented in the PACS or zVision Dashboard.    I personally reviewed the radiologic studies   Procedures: G-tube replacement. Examination of his previous G-tube does not show an actual millimeter diameter. I selected first starting with a 20 mm which along with an 18 mm cannot be passed even with dilation of the hole. The tract appears to extend superiorly. The patient had placement of a 19 French G-tube. This will keep the tract open and hopefully be able to feed but they will require further assessment and possible surgical dilation of the G-tube site   ED Course:  The patient's attempted to place an NG tube was unsuccessful here in emergency department, most likely the track is not well formed at this point. Patient will require further tube feeds due to his long-term effects from last, etc. The patient appears to be of understanding though is nonverbal and discussion was performed with the family who understands that he is going to need a fresh G-tube established. I discussed the case with the special procedure group and they plan on inserting a fresh G-tube  Assessment:  G-tube  complication      Plan:  Patient be transferred to special procedures            Jennye Moccasin, MD 11/26/15 1214

## 2015-11-26 NOTE — Procedures (Signed)
Interventional Radiology Procedure Note  Procedure:  Rescue of percutaneous gastrostomy tube, after accidental displacement.   The prior tract was used to access the stomach, with placement of a new, 68F pull-through g-tube.  Complications: None Recommendations:  - Observe for 1 hour recovery, post-sedation - Ok to use tube. - Do not submerge   - Routine g-tube care   Signed,  Yvone Neu. Loreta Ave, DO

## 2015-11-26 NOTE — ED Notes (Signed)
MD at bedside for PEG tube placement. 6fr tube placed and secured. Flushes easily. Dressing placed. XR ordered at this time.

## 2015-11-26 NOTE — ED Notes (Signed)
Pt presents to ER via [facility] with chief complaint of PEG tube dislodgement, Pt recently diagnosed with ALS in December 2016, pt's speech is slurred due to ALS,  points to area where tube was dislodged reports pain. Pt is awake, calm, alert.

## 2015-11-26 NOTE — ED Notes (Signed)
MD states that tube is not in the correct place. Balloon deflated and tube removed per MD order.

## 2015-12-23 ENCOUNTER — Emergency Department
Admission: EM | Admit: 2015-12-23 | Discharge: 2015-12-23 | Disposition: A | Discharge status: Home / Self Care | Payer: Medicaid Other | Attending: Emergency Medicine | Admitting: Emergency Medicine

## 2015-12-23 ENCOUNTER — Emergency Department: Payer: Medicaid Other

## 2015-12-23 ENCOUNTER — Encounter: Payer: Self-pay | Admitting: Emergency Medicine

## 2015-12-23 DIAGNOSIS — K9423 Gastrostomy malfunction: Secondary | ICD-10-CM | POA: Insufficient documentation

## 2015-12-23 DIAGNOSIS — F17210 Nicotine dependence, cigarettes, uncomplicated: Secondary | ICD-10-CM | POA: Insufficient documentation

## 2015-12-23 DIAGNOSIS — G1221 Amyotrophic lateral sclerosis: Secondary | ICD-10-CM | POA: Diagnosis not present

## 2015-12-23 DIAGNOSIS — E119 Type 2 diabetes mellitus without complications: Secondary | ICD-10-CM | POA: Insufficient documentation

## 2015-12-23 DIAGNOSIS — T85598A Other mechanical complication of other gastrointestinal prosthetic devices, implants and grafts, initial encounter: Secondary | ICD-10-CM

## 2015-12-23 DIAGNOSIS — Z794 Long term (current) use of insulin: Secondary | ICD-10-CM | POA: Diagnosis not present

## 2015-12-23 NOTE — ED Notes (Signed)
Pt to ed with c/o feeding tube not staying in place.  Pt states this am noted tube was loose and about to come out of abd.  Tube is in place at triage but sutures to hold it in place area missing.  Tape placed on tube to secure it in abd at this time.

## 2015-12-23 NOTE — Discharge Instructions (Signed)
Care of a Feeding Tube People who have trouble swallowing or cannot take food or medicine by mouth are sometimes given feeding tubes. A feeding tube can go into the nose and down to the stomach or through the skin in the abdomen and into the stomach or small bowel. Some of the names of these feeding tubes are gastrostomy tubes, PEG lines, nasogastric tubes, and gastrojejunostomy tubes.  SUPPLIES NEEDED TO CARE FOR THE TUBE SITE  Clean gloves.  Clean wash cloth, gauze pads, or soft paper towel.  Cotton swabs.  Skin barrier ointment or cream.  Soap and water.  Pre-cut foam pads or gauze (that go around the tube).  Tube tape. TUBE SITE CARE 1. Have all supplies ready and available. 2. Wash hands well. 3. Put on clean gloves. 4. Remove the soiled foam pad or gauze, if present, that is found under the tube stabilizer. Change the foam pad or gauze daily or when soiled or moist. 5. Check the skin around the tube site for redness, rash, swelling, drainage, or extra tissue growth. If you notice any of these, call your caregiver. 6. Moisten gauze and cotton swabs with water and soap. 7. Wipe the area closest to the tube (right near the stoma) with cotton swabs. Wipe the surrounding skin with moistened gauze. Rinse with water. 8. Dry the skin and stoma site with a dry gauze pad or soft paper towel. Do not use antibiotic ointments at the tube site. 9. If the skin is red, apply a skin barrier cream or ointment (such as petroleum jelly) in a circular motion, using a cotton swab. The cream or ointment will provide a moisture barrier for the skin and helps with wound healing. 10. Apply a new pre-cut foam pad or gauze around the tube. Secure it with tape around the edges. If no drainage is present, foam pads or gauze may be left off. 11. Use tape or an anchoring device to fasten the feeding tube to the skin for comfort or as directed. Rotate where you tape the tube to avoid skin damage from the  adhesive. 12. Position the person in a semi-upright position (30-45 degree angle). 13. Throw away used supplies. 14. Remove gloves. 15. Wash hands. SUPPLIES NEEDED TO FLUSH A FEEDING TUBE  Clean gloves.  60 mL syringe (that connects to the feeding tube).  Towel.  Water. FLUSHING A FEEDING TUBE  1. Have all supplies ready and available. 2. Wash hands well. 3. Put on clean gloves. 4. Draw up 30 mL of water in the syringe. 5. Kink the feeding tube while disconnecting it from the feeding-bag tubing or while removing the plug at the end of the tube. Kinking closes the tube and prevents secretions in the tube from spilling out. 6. Insert the tip of the syringe into the end of the feeding tube. Release the kink. Slowly inject the water. 7. If unable to inject the water, the person with the feeding tube should lay on his or her left side. The tip of the tube may be against the stomach wall, blocking fluid flow. Changing positions may move the tip away from the stomach wall. After repositioning, try injecting the water again. 8. After injecting the water, remove the syringe. 9. Always flush before giving the first medicine, between medicines, and after the final medicine before starting a feeding. This prevents medicines from clogging the tube. 10. Throw away used supplies. 11. Remove gloves. 12. Wash hands.   This information is not intended to replace   advice given to you by your health care provider. Make sure you discuss any questions you have with your health care provider.   Document Released: 09/18/2005 Document Revised: 09/04/2012 Document Reviewed: 05/02/2012 Elsevier Interactive Patient Education 2016 [employer]   

## 2015-12-23 NOTE — ED Notes (Signed)
Pt does not have a bulb on his g-tube - flushes easily.

## 2015-12-23 NOTE — ED Provider Notes (Signed)
[facility] Emergency Department Provider Note     Time seen: ----------------------------------------- 12:33 PM on 12/23/2015 -----------------------------------------    I have reviewed the triage vital signs and the nursing notes.   HISTORY  Chief Complaint Other    HPI Randy Roach is a 40 y.o. male who presents ER stating his feeding tube was not staying in place, states the tube was about come out of his abdomen.Patient denies any fevers chills or other complaints, denies any abdominal pain. His only concern is his feeding tube.   Past Medical History  Diagnosis Date  . Diabetes mellitus without complication (HCC)   . ALS (amyotrophic lateral sclerosis) (HCC)     There are no active problems to display for this patient.   Past Surgical History  Procedure Laterality Date  . Appendectomy    . Peg tube placement Left     Allergies Review of patient's allergies indicates no known allergies.  Social History Social History  Substance Use Topics  . Smoking status: Current Every Day Smoker -- 1.00 packs/day    Types: Cigarettes  . Smokeless tobacco: None  . Alcohol Use: No     Comment: occ    Review of Systems Constitutional: Negative for fever.  Gastrointestinal: Positive for feeding tube dysfunction   ____________________________________________   PHYSICAL EXAM:  VITAL SIGNS: ED Triage Vitals  Enc Vitals Group     BP 12/23/15 0940 119/78 mmHg     Pulse Rate 12/23/15 0940 122     Resp 12/23/15 0940 20     Temp 12/23/15 0940 97.5 F (36.4 C)     Temp Source 12/23/15 0940 Oral     SpO2 12/23/15 0940 99 %     Weight 12/23/15 0940 110 lb (49.896 kg)     Height --      Head Cir --      Peak Flow --      Pain Score 12/23/15 0940 0     Pain Loc --      Pain Edu? --      Excl. in GC? --     Constitutional: Alert and oriented. Well appearing and in no distress. Gastrointestinal: Soft nontender, G-tube slides outward  slightly but not completely. Appears to slide out to about 4 cm. Skin:  Skin is warm, dry and intact. No rash noted. No signs of G-tube infection  ____________________________________________  ED COURSE:  Pertinent labs & imaging results that were available during my care of the patient were reviewed by me and considered in my medical decision making (see chart for details). Patient is in no acute distress, will check feeding tube placement on KUB with Gastrografin. ____________________________________________    RADIOLOGY Images were viewed by me   IMPRESSION: The gastrostomy tube is appropriately positioned within the body of the stomach. There is no extravasated contrast to suggest leak.   ____________________________________________  FINAL ASSESSMENT AND PLAN  Feeding tube dysfunction  Plan: Patient with imaging as dictated above. Patient has follow-up at the ALS clinic tomorrow. He is stable for discharge.   Emily FilbertWilliams, Jonathan E, MD   Emily FilbertJonathan E Williams, MD 12/23/15 1400

## 2016-02-08 ENCOUNTER — Emergency Department: Payer: Medicaid Other

## 2016-02-08 ENCOUNTER — Emergency Department
Admission: EM | Admit: 2016-02-08 | Discharge: 2016-02-08 | Disposition: A | Discharge status: Home / Self Care | Payer: Medicaid Other | Attending: Emergency Medicine | Admitting: Emergency Medicine

## 2016-02-08 ENCOUNTER — Encounter: Payer: Self-pay | Admitting: Emergency Medicine

## 2016-02-08 DIAGNOSIS — S161XXA Strain of muscle, fascia and tendon at neck level, initial encounter: Secondary | ICD-10-CM | POA: Diagnosis not present

## 2016-02-08 DIAGNOSIS — Y92000 Kitchen of unspecified non-institutional (private) residence as  the place of occurrence of the external cause: Secondary | ICD-10-CM | POA: Diagnosis not present

## 2016-02-08 DIAGNOSIS — W19XXXA Unspecified fall, initial encounter: Secondary | ICD-10-CM

## 2016-02-08 DIAGNOSIS — Y999 Unspecified external cause status: Secondary | ICD-10-CM | POA: Diagnosis not present

## 2016-02-08 DIAGNOSIS — E119 Type 2 diabetes mellitus without complications: Secondary | ICD-10-CM | POA: Insufficient documentation

## 2016-02-08 DIAGNOSIS — Z7984 Long term (current) use of oral hypoglycemic drugs: Secondary | ICD-10-CM | POA: Insufficient documentation

## 2016-02-08 DIAGNOSIS — Y9389 Activity, other specified: Secondary | ICD-10-CM | POA: Diagnosis not present

## 2016-02-08 DIAGNOSIS — M25512 Pain in left shoulder: Secondary | ICD-10-CM | POA: Diagnosis not present

## 2016-02-08 DIAGNOSIS — W07XXXA Fall from chair, initial encounter: Secondary | ICD-10-CM | POA: Diagnosis not present

## 2016-02-08 DIAGNOSIS — S0990XA Unspecified injury of head, initial encounter: Secondary | ICD-10-CM | POA: Diagnosis not present

## 2016-02-08 DIAGNOSIS — F17210 Nicotine dependence, cigarettes, uncomplicated: Secondary | ICD-10-CM | POA: Diagnosis not present

## 2016-02-08 LAB — CBC WITH DIFFERENTIAL/PLATELET
Basophils Absolute: 0.1 10*3/uL (ref 0–0.1)
Basophils Relative: 1 %
Eosinophils Absolute: 0.1 10*3/uL (ref 0–0.7)
Eosinophils Relative: 2 %
HCT: 41.6 % (ref 40.0–52.0)
Hemoglobin: 13.8 g/dL (ref 13.0–18.0)
Lymphocytes Relative: 28 %
Lymphs Abs: 1.9 10*3/uL (ref 1.0–3.6)
MCH: 30.1 pg (ref 26.0–34.0)
MCHC: 33.2 g/dL (ref 32.0–36.0)
MCV: 90.5 fL (ref 80.0–100.0)
Monocytes Absolute: 0.6 10*3/uL (ref 0.2–1.0)
Monocytes Relative: 9 %
Neutro Abs: 4.2 10*3/uL (ref 1.4–6.5)
Neutrophils Relative %: 62 %
Platelets: 324 10*3/uL (ref 150–440)
RBC: 4.6 MIL/uL (ref 4.40–5.90)
RDW: 12.5 % (ref 11.5–14.5)
WBC: 6.7 10*3/uL (ref 3.8–10.6)

## 2016-02-08 LAB — URINALYSIS COMPLETE WITH MICROSCOPIC (ARMC ONLY)
Bacteria, UA: NONE SEEN
Bilirubin Urine: NEGATIVE
Glucose, UA: NEGATIVE mg/dL
Hgb urine dipstick: NEGATIVE
Ketones, ur: NEGATIVE mg/dL
Leukocytes, UA: NEGATIVE
Nitrite: NEGATIVE
Protein, ur: NEGATIVE mg/dL
RBC / HPF: NONE SEEN RBC/hpf (ref 0–5)
Specific Gravity, Urine: 1.006 (ref 1.005–1.030)
Squamous Epithelial / HPF: NONE SEEN
WBC, UA: NONE SEEN WBC/hpf (ref 0–5)
pH: 9 — ABNORMAL HIGH (ref 5.0–8.0)

## 2016-02-08 LAB — BASIC METABOLIC PANEL
Anion gap: 10 (ref 5–15)
BUN: 15 mg/dL (ref 6–20)
CHLORIDE: 90 mmol/L — AB (ref 101–111)
CO2: 36 mmol/L — AB (ref 22–32)
Calcium: 9.8 mg/dL (ref 8.9–10.3)
Creatinine, Ser: 0.5 mg/dL — ABNORMAL LOW (ref 0.61–1.24)
GFR calc Af Amer: >60 mL/min (ref >60)
GLUCOSE: 129 mg/dL — AB (ref 65–99)
POTASSIUM: 4.1 mmol/L (ref 3.5–5.1)
Sodium: 136 mmol/L (ref 135–145)

## 2016-02-08 LAB — TSH: TSH: 5.115 u[IU]/mL — AB (ref 0.350–4.500)

## 2016-02-08 MED ORDER — HYDROCODONE-ACETAMINOPHEN 7.5-325 MG/15ML PO SOLN
10.0000 mL | Freq: Once | ORAL | Status: AC
  Administered 2016-02-08: 10 mL
  Filled 2016-02-08: qty 15

## 2016-02-08 MED ORDER — HYDROCODONE-ACETAMINOPHEN 7.5-325 MG/15ML PO SOLN: 10.0000 mL | Freq: Four times a day (QID) | ORAL | Status: DC | PRN

## 2016-02-08 MED ORDER — SODIUM CHLORIDE 0.9 % IV BOLUS (SEPSIS)
1000.0000 mL | Freq: Once | INTRAVENOUS | Status: AC
  Administered 2016-02-08: 1000 mL via INTRAVENOUS

## 2016-02-08 MED ORDER — ONDANSETRON HCL 4 MG/2ML IJ SOLN
4.0000 mg | Freq: Once | INTRAMUSCULAR | Status: AC
  Administered 2016-02-08: 4 mg via INTRAVENOUS
  Filled 2016-02-08: qty 2

## 2016-02-08 MED ORDER — MORPHINE SULFATE (PF) 2 MG/ML IV SOLN
2.0000 mg | Freq: Once | INTRAVENOUS | Status: AC
  Administered 2016-02-08: 2 mg via INTRAVENOUS
  Filled 2016-02-08: qty 1

## 2016-02-08 NOTE — ED Notes (Signed)
Gtube flushed after medication given

## 2016-02-08 NOTE — Discharge Instructions (Signed)
1. You may take pain medicine as needed (Lortab elixir 10mL via G-tube every 6 hours as needed for pain). 2. Apply ice to affected area several times daily. 3. Return to the ER for worsening symptoms, persistent vomiting, difficulty breathing or other concerns.  Cervical Sprain A cervical sprain is an injury in the neck in which the strong, fibrous tissues (ligaments) that connect your neck bones stretch or tear. Cervical sprains can range from mild to severe. Severe cervical sprains can cause the neck vertebrae to be unstable. This can lead to damage of the spinal cord and can result in serious nervous system problems. The amount of time it takes for a cervical sprain to get better depends on the cause and extent of the injury. Most cervical sprains heal in 1 to 3 weeks. CAUSES  Severe cervical sprains may be caused by:   Contact sport injuries (such as from football, rugby, wrestling, hockey, auto racing, gymnastics, diving, martial arts, or boxing).   Motor vehicle collisions.   Whiplash injuries. This is an injury from a sudden forward and backward whipping movement of the head and neck.  Falls.  Mild cervical sprains may be caused by:   Being in an awkward position, such as while cradling a telephone between your ear and shoulder.   Sitting in a chair that does not offer proper support.   Working at a poorly Marketing executive station.   Looking up or down for long periods of time.  SYMPTOMS   Pain, soreness, stiffness, or a burning sensation in the front, back, or sides of the neck. This discomfort may develop immediately after the injury or slowly, 24 hours or more after the injury.   Pain or tenderness directly in the middle of the back of the neck.   Shoulder or upper back pain.   Limited ability to move the neck.   Headache.   Dizziness.   Weakness, numbness, or tingling in the hands or arms.   Muscle spasms.   Difficulty swallowing or chewing.    Tenderness and swelling of the neck.  DIAGNOSIS  Most of the time your health care provider can diagnose a cervical sprain by taking your history and doing a physical exam. Your health care provider will ask about previous neck injuries and any known neck problems, such as arthritis in the neck. X-rays may be taken to find out if there are any other problems, such as with the bones of the neck. Other tests, such as a CT scan or MRI, may also be needed.  TREATMENT  Treatment depends on the severity of the cervical sprain. Mild sprains can be treated with rest, keeping the neck in place (immobilization), and pain medicines. Severe cervical sprains are immediately immobilized. Further treatment is done to help with pain, muscle spasms, and other symptoms and may include:  Medicines, such as pain relievers, numbing medicines, or muscle relaxants.   Physical therapy. This may involve stretching exercises, strengthening exercises, and posture training. Exercises and improved posture can help stabilize the neck, strengthen muscles, and help stop symptoms from returning.  HOME CARE INSTRUCTIONS   Put ice on the injured area.   Put ice in a plastic bag.   Place a towel between your skin and the bag.   Leave the ice on for 15-20 minutes, 3-4 times a day.   If your injury was severe, you may have been given a cervical collar to wear. A cervical collar is a two-piece collar designed to keep your  neck from moving while it heals.  Do not remove the collar unless instructed by your health care provider.  If you have long hair, keep it outside of the collar.  Ask your health care provider before making any adjustments to your collar. Minor adjustments may be required over time to improve comfort and reduce pressure on your chin or on the back of your head.  Ifyou are allowed to remove the collar for cleaning or bathing, follow your health care provider's instructions on how to do so  safely.  Keep your collar clean by wiping it with mild soap and water and drying it completely. If the collar you have been given includes removable pads, remove them every 1-2 days and hand wash them with soap and water. Allow them to air dry. They should be completely dry before you wear them in the collar.  If you are allowed to remove the collar for cleaning and bathing, wash and dry the skin of your neck. Check your skin for irritation or sores. If you see any, tell your health care provider.  Do not drive while wearing the collar.   Only take over-the-counter or prescription medicines for pain, discomfort, or fever as directed by your health care provider.   Keep all follow-up appointments as directed by your health care provider.   Keep all physical therapy appointments as directed by your health care provider.   Make any needed adjustments to your workstation to promote good posture.   Avoid positions and activities that make your symptoms worse.   Warm up and stretch before being active to help prevent problems.  SEEK MEDICAL CARE IF:   Your pain is not controlled with medicine.   You are unable to decrease your pain medicine over time as planned.   Your activity level is not improving as expected.  SEEK IMMEDIATE MEDICAL CARE IF:   You develop any bleeding.  You develop stomach upset.  You have signs of an allergic reaction to your medicine.   Your symptoms get worse.   You develop new, unexplained symptoms.   You have numbness, tingling, weakness, or paralysis in any part of your body.  MAKE SURE YOU:   Understand these instructions.  Will watch your condition.  Will get help right away if you are not doing well or get worse.   This information is not intended to replace advice given to you by your health care provider. Make sure you discuss any questions you have with your health care provider.   Document Released: 07/16/2007 Document  Revised: 09/23/2013 Document Reviewed: 03/26/2013 Elsevier Interactive Patient Education 2016 [employer]  Head Injury, Adult You have a head injury. Headaches and throwing up (vomiting) are common after a head injury. It should be easy to wake up from sleeping. Sometimes you must stay in the hospital. Most problems happen within the first 24 hours. Side effects may occur up to 7-10 days after the injury.  WHAT ARE THE TYPES OF HEAD INJURIES? Head injuries can be as minor as a bump. Some head injuries can be more severe. More severe head injuries include:  A jarring injury to the brain (concussion).  A bruise of the brain (contusion). This mean there is bleeding in the brain that can cause swelling.  A cracked skull (skull fracture).  Bleeding in the brain that collects, clots, and forms a bump (hematoma). WHEN SHOULD I GET HELP RIGHT AWAY?   You are confused or sleepy.  You cannot be woken  up.  You feel sick to your stomach (nauseous) or keep throwing up (vomiting).  Your dizziness or unsteadiness is getting worse.  You have very bad, lasting headaches that are not helped by medicine. Take medicines only as told by your doctor.  You cannot use your arms or legs like normal.  You cannot walk.  You notice changes in the black spots in the center of the colored part of your eye (pupil).  You have clear or bloody fluid coming from your nose or ears.  You have trouble seeing. During the next 24 hours after the injury, you must stay with someone who can watch you. This person should get help right away (call 911 in the [place] if you start to shake and are not able to control it (have seizures), you pass out, or you are unable to wake up. HOW CAN I PREVENT A HEAD INJURY IN THE FUTURE?  Wear seat belts.  Wear a helmet while bike riding and playing sports like football.  Stay away from dangerous activities around the house. WHEN CAN I RETURN TO NORMAL ACTIVITIES AND  ATHLETICS? See your doctor before doing these activities. You should not do normal activities or play contact sports until 1 week after the following symptoms have stopped:  Headache that does not go away.  Dizziness.  Poor attention.  Confusion.  Memory problems.  Sickness to your stomach or throwing up.  Tiredness.  Fussiness.  Bothered by bright lights or loud noises.  Anxiousness or depression.  Restless sleep. MAKE SURE YOU:   Understand these instructions.  Will watch your condition.  Will get help right away if you are not doing well or get worse.   This information is not intended to replace advice given to you by your health care provider. Make sure you discuss any questions you have with your health care provider.   Document Released: 08/31/2008 Document Revised: 10/09/2014 Document Reviewed: 05/26/2013 Elsevier Interactive Patient Education 2016 Else[employer] Inc[employer]  Shoulder Pain The shoulder is the joint that connects your arms to your body. The bones that form the shoulder joint include the upper arm bone (humerus), the shoulder blade (scapula), and the collarbone (clavicle). The top of the humerus is shaped like a ball and fits into a rather flat socket on the scapula (glenoid cavity). A combination of muscles and strong, fibrous tissues that connect muscles to bones (tendons) support your shoulder joint and hold the ball in the socket. Small, fluid-filled sacs (bursae) are located in different areas of the joint. They act as cushions between the bones and the overlying soft tissues and help reduce friction between the gliding tendons and the bone as you move your arm. Your shoulder joint allows a wide range of motion in your arm. This range of motion allows you to do things like scratch your back or throw a ball. However, this range of motion also makes your shoulder more prone to pain from overuse and injury. Causes of shoulder pain can originate from both injury and  overuse and usually can be grouped in the following four categories:  Redness, swelling, and pain (inflammation) of the tendon (tendinitis) or the bursae (bursitis).  Instability, such as a dislocation of the joint.  Inflammation of the joint (arthritis).  Broken bone (fracture). HOME CARE INSTRUCTIONS   Apply ice to the sore area.  Put ice in a plastic bag.  Place a towel between your skin and the bag.  Leave the ice on for 15-20 minutes, 3-4  times per day for the first 2 days, or as directed by your health care provider.  Stop using cold packs if they do not help with the pain.  If you have a shoulder sling or immobilizer, wear it as long as your caregiver instructs. Only remove it to shower or bathe. Move your arm as little as possible, but keep your hand moving to prevent swelling.  Squeeze a soft ball or foam pad as much as possible to help prevent swelling.  Only take over-the-counter or prescription medicines for pain, discomfort, or fever as directed by your caregiver. SEEK MEDICAL CARE IF:   Your shoulder pain increases, or new pain develops in your arm, hand, or fingers.  Your hand or fingers become cold and numb.  Your pain is not relieved with medicines. SEEK IMMEDIATE MEDICAL CARE IF:   Your arm, hand, or fingers are numb or tingling.  Your arm, hand, or fingers are significantly swollen or turn white or blue. MAKE SURE YOU:   Understand these instructions.  Will watch your condition.  Will get help right away if you are not doing well or get worse.   This information is not intended to replace advice given to you by your health care provider. Make sure you discuss any questions you have with your health care provider.   Document Released: 06/28/2005 Document Revised: 10/09/2014 Document Reviewed: 01/11/2015 Elsevier Interactive Patient Education [employer]

## 2016-02-08 NOTE — ED Notes (Addendum)
Pt presents to ED via EMS to be evaluated for fall. EMS report pt had unwitnessed fall, hitting head. Abrasions noted on top of the head. Pt c/o shoulders pain. No deformity noted. Per EMS CBG-102.

## 2016-02-08 NOTE — ED Notes (Signed)
Placed 4X4 dsg around gtube per Dr Dolores FrameSung request

## 2016-02-08 NOTE — ED Notes (Signed)
Per Dr Dolores FrameSung pt given soft collar c-collar to keep neck upright (due to als)

## 2016-02-08 NOTE — ED Provider Notes (Signed)
[facility] Emergency Department Provider Note   ____________________________________________  Time seen: Approximately 3:20 AM  I have reviewed the triage vital signs and the nursing notes.   HISTORY  Chief Complaint Fall  Able to answer yes/no questions and right responses on paper  HPI Randy Roach is a 40 y.o. male who presents to the ED from home via EMS s/p fall with left shoulder pain. Patient is nonverbal with a history of ALS but able to communicate via writing. States he was standing on a kitchen chair trying to get a bandage to cover his G-tube as he does nightly when he lost his balance and fell, striking his head, neck and left shoulder. Denies LOC. Complains of pain to his head, neck and shoulder. Denies vision changes, limb weakness, numbness/tingling. Denies recent fever, chills, chest pain, shortness of breath, abdominal pain, nausea, vomiting, diarrhea, dysuria. Nothing makes his pain better. Movement makes his pain worse.   Past Medical History  Diagnosis Date  . Diabetes mellitus without complication (HCC)   . ALS (amyotrophic lateral sclerosis) (HCC)     There are no active problems to display for this patient.   Past Surgical History  Procedure Laterality Date  . Appendectomy    . Peg tube placement Left     Current Outpatient Rx  Name  Route  Sig  Dispense  Refill  . Chlorpheniramine-DM (CORICIDIN HBP COUGH/COLD PO)   Oral   Take by mouth See admin instructions. Pt states that he took 32 tablets this morning.  (04/26/15)         . magnesium oxide (MAG-OX) 400 MG tablet   Oral   Take 400 mg by mouth 2 (two) times daily.         . metFORMIN (GLUCOPHAGE) 1000 MG tablet   Oral   Take 500 mg by mouth 2 (two) times daily.          . Multiple Vitamins-Minerals (MULTIVITAMINS THER. W/MINERALS) TABS tablet   Oral   Take 1 tablet by mouth daily.           Allergies Review of patient's allergies indicates no  known allergies.  History reviewed. No pertinent family history.  Social History Social History  Substance Use Topics  . Smoking status: Current Every Day Smoker -- 1.00 packs/day    Types: Cigarettes  . Smokeless tobacco: None  . Alcohol Use: No     Comment: occ    Review of Systems  Constitutional: No fever/chills. Eyes: No visual changes. ENT: No sore throat. Cardiovascular: Denies chest pain. Respiratory: Denies shortness of breath. Gastrointestinal: No abdominal pain.  No nausea, no vomiting.  No diarrhea.  No constipation. Genitourinary: Negative for dysuria. Musculoskeletal: Positive for neck and left shoulder pain. Negative for back pain. Skin: Negative for rash. Neurological: Positive for head pain. Negative for headaches, focal weakness or numbness.  10-point ROS otherwise negative.  ____________________________________________   PHYSICAL EXAM:  VITAL SIGNS: ED Triage Vitals  Enc Vitals Group     BP 02/08/16 0206 119/80 mmHg     Pulse Rate 02/08/16 0206 137     Resp 02/08/16 0206 22     Temp 02/08/16 0206 98.6 F (37 C)     Temp Source 02/08/16 0206 Oral     SpO2 02/08/16 0206 98 %     Weight 02/08/16 0206 120 lb (54.432 kg)     Height 02/08/16 0206 5\' 7"  (1.702 m)     Head Cir --  Peak Flow --      Pain Score 02/08/16 0159 6     Pain Loc --      Pain Edu? --      Excl. in GC? --     Constitutional: Alert and oriented. Well appearing and in mild acute distress. Eyes: Conjunctivae are normal. PERRL. EOMI. Head: Atraumatic. Multiple pre-existing excoriations to scalp. Nose: No congestion/rhinnorhea. Mouth/Throat: Mucous membranes are moist.  Oropharynx non-erythematous. Neck: No stridor.  Midline cervical spine tenderness to palpation. No step-offs or deformities noted. Cardiovascular: Tachycardic rate, regular rhythm. Grossly normal heart sounds.  Good peripheral circulation. Respiratory: Normal respiratory effort.  No retractions. Lungs  CTAB. Gastrointestinal: Soft and nontender. G-tube in place. No distention. No abdominal bruits. No CVA tenderness. Musculoskeletal: Left anterior shoulder tender to palpation. No deformities noted. Full range of motion with pain. 2+ radial pulses. Brisk, less than 5 second capillary refill. No lower extremity tenderness nor edema.  No joint effusions. Neurologic:  Nonverbal at baseline. Alert and oriented 3. Able to answer yes/no questions and to communicate via writing. No gross focal neurologic deficits are appreciated.  Skin:  Skin is warm, dry and intact. No rash noted. Psychiatric: Mood and affect are normal. Speech and behavior are normal.  ____________________________________________   LABS (all labs ordered are listed, but only abnormal results are displayed)  Labs Reviewed  BASIC METABOLIC PANEL - Abnormal; Notable for the following:    Chloride 90 (*)    CO2 36 (*)    Glucose, Bld 129 (*)    Creatinine, Ser 0.50 (*)    All other components within normal limits  URINALYSIS COMPLETEWITH MICROSCOPIC ([facility] ONLY) - Abnormal; Notable for the following:    Color, Urine YELLOW (*)    APPearance HAZY (*)    pH 9.0 (*)    All other components within normal limits  TSH - Abnormal; Notable for the following:    TSH 5.115 (*)    All other components within normal limits  CBC WITH DIFFERENTIAL/PLATELET   ____________________________________________  EKG  ED ECG REPORT I, Trei Schoch J, the attending physician, personally viewed and interpreted this ECG.   Date: 02/08/2016  EKG Time: 0229  Rate: 131  Rhythm: sinus tachycardia  Axis: Normal  Intervals:none  ST&T Change: Nonspecific  ____________________________________________  RADIOLOGY  CT head/cervical spine interpreted per Dr. Gwenyth Benderadparvar: No acute intracranial pathology.  No acute/traumatic cervical spine pathology.  Left shoulder x-rays (reviewed by me, interpreted per Dr.  Clovis RileyMitchell): Negative. ____________________________________________   PROCEDURES  Procedure(s) performed: None  Critical Care performed: No  ____________________________________________   INITIAL IMPRESSION / ASSESSMENT AND PLAN / ED COURSE  Pertinent labs & imaging results that were available during my care of the patient were reviewed by me and considered in my medical decision making (see chart for details).  40 year old male with a history of ALS who presents s/p mechanical fall with head, neck and left shoulder pain. Patient's neck tilts forward at baseline and he has to hold it up. Cervical collar extremely uncomfortable so will hold for this reason. Will obtain CT of head and cervical spine; plain film x-rays of left shoulder. Patient noted with tachycardia. He states this is usual for him; baseline heart rate in the 120s. It has been 2 months since he last had labs checked at the ALS clinic; will check screening lab work, initiate IV fluids and analgesia.  ----------------------------------------- 5:59 AM on 02/08/2016 -----------------------------------------  Patient feeling better. Heart rate 106 after IV fluids, blood pressure 120/89,  room air saturations 96%.Marland Kitchen Updated patient and family member of laboratory and imaging results. Will prescribe Lortab elixir to use via G-tube as needed for pain and patient will follow-up with orthopedics as needed. Strict return precautions given. Patient verbalizes understanding and agrees with plan of care. ____________________________________________   FINAL CLINICAL IMPRESSION(S) / ED DIAGNOSES  Final diagnoses:  Fall, initial encounter  Head injury, initial encounter  Cervical strain, initial encounter  Left shoulder pain      NEW MEDICATIONS STARTED DURING THIS VISIT:  New Prescriptions   No medications on file     Note:  This document was prepared using Dragon voice recognition software and may include unintentional  dictation errors.    Irean Hong, MD 02/08/16 [phone]

## 2016-03-01 ENCOUNTER — Emergency Department
Admission: EM | Admit: 2016-03-01 | Discharge: 2016-03-01 | Disposition: A | Discharge status: Home / Self Care | Payer: Medicaid Other | Attending: Emergency Medicine | Admitting: Emergency Medicine

## 2016-03-01 ENCOUNTER — Telehealth: Payer: Self-pay | Admitting: Emergency Medicine

## 2016-03-01 ENCOUNTER — Emergency Department: Payer: Medicaid Other

## 2016-03-01 DIAGNOSIS — F17210 Nicotine dependence, cigarettes, uncomplicated: Secondary | ICD-10-CM | POA: Insufficient documentation

## 2016-03-01 DIAGNOSIS — Y999 Unspecified external cause status: Secondary | ICD-10-CM | POA: Insufficient documentation

## 2016-03-01 DIAGNOSIS — W19XXXA Unspecified fall, initial encounter: Secondary | ICD-10-CM | POA: Insufficient documentation

## 2016-03-01 DIAGNOSIS — Y92009 Unspecified place in unspecified non-institutional (private) residence as the place of occurrence of the external cause: Secondary | ICD-10-CM | POA: Diagnosis not present

## 2016-03-01 DIAGNOSIS — Z7984 Long term (current) use of oral hypoglycemic drugs: Secondary | ICD-10-CM | POA: Diagnosis not present

## 2016-03-01 DIAGNOSIS — E119 Type 2 diabetes mellitus without complications: Secondary | ICD-10-CM | POA: Insufficient documentation

## 2016-03-01 DIAGNOSIS — T148 Other injury of unspecified body region: Secondary | ICD-10-CM | POA: Insufficient documentation

## 2016-03-01 DIAGNOSIS — R000 Tachycardia, unspecified: Secondary | ICD-10-CM

## 2016-03-01 DIAGNOSIS — T148XXA Other injury of unspecified body region, initial encounter: Secondary | ICD-10-CM

## 2016-03-01 DIAGNOSIS — M546 Pain in thoracic spine: Secondary | ICD-10-CM | POA: Diagnosis present

## 2016-03-01 LAB — URINALYSIS COMPLETE WITH MICROSCOPIC (ARMC ONLY)
BACTERIA UA: NONE SEEN
BILIRUBIN URINE: NEGATIVE
Glucose, UA: 50 mg/dL — AB
HGB URINE DIPSTICK: NEGATIVE
Ketones, ur: NEGATIVE mg/dL
LEUKOCYTES UA: NEGATIVE
NITRITE: NEGATIVE
PH: 7 (ref 5.0–8.0)
Protein, ur: NEGATIVE mg/dL
RBC / HPF: NONE SEEN RBC/hpf (ref 0–5)
SPECIFIC GRAVITY, URINE: 1.004 — AB (ref 1.005–1.030)
Squamous Epithelial / LPF: NONE SEEN
WBC, UA: NONE SEEN WBC/hpf (ref 0–5)

## 2016-03-01 LAB — CBC
HCT: 39.8 % — ABNORMAL LOW (ref 40.0–52.0)
Hemoglobin: 13.6 g/dL (ref 13.0–18.0)
MCH: 30 pg (ref 26.0–34.0)
MCHC: 34.1 g/dL (ref 32.0–36.0)
MCV: 88 fL (ref 80.0–100.0)
PLATELETS: 348 10*3/uL (ref 150–440)
RBC: 4.52 MIL/uL (ref 4.40–5.90)
RDW: 12.4 % (ref 11.5–14.5)
WBC: 7.4 10*3/uL (ref 3.8–10.6)

## 2016-03-01 LAB — COMPREHENSIVE METABOLIC PANEL
ALT: 18 U/L (ref 17–63)
AST: 17 U/L (ref 15–41)
Albumin: 3.9 g/dL (ref 3.5–5.0)
Alkaline Phosphatase: 92 U/L (ref 38–126)
Anion gap: 9 (ref 5–15)
BUN: 12 mg/dL (ref 6–20)
CHLORIDE: 90 mmol/L — AB (ref 101–111)
CO2: 35 mmol/L — AB (ref 22–32)
CREATININE: 0.46 mg/dL — AB (ref 0.61–1.24)
Calcium: 9.8 mg/dL (ref 8.9–10.3)
GFR calc non Af Amer: >60 mL/min (ref >60)
Glucose, Bld: 176 mg/dL — ABNORMAL HIGH (ref 65–99)
Potassium: 3.7 mmol/L (ref 3.5–5.1)
SODIUM: 134 mmol/L — AB (ref 135–145)
Total Bilirubin: 0.4 mg/dL (ref 0.3–1.2)
Total Protein: 7.7 g/dL (ref 6.5–8.1)

## 2016-03-01 LAB — LACTIC ACID, PLASMA: Lactic Acid, Venous: 1.2 mmol/L (ref 0.5–2.0)

## 2016-03-01 LAB — FIBRIN DERIVATIVES D-DIMER (ARMC ONLY): FIBRIN DERIVATIVES D-DIMER (ARMC): 461 (ref 0–499)

## 2016-03-01 LAB — TROPONIN I: Troponin I: <0.03 ng/mL (ref <0.031)

## 2016-03-01 MED ORDER — SODIUM CHLORIDE 0.9 % IV BOLUS (SEPSIS)
1000.0000 mL | Freq: Once | INTRAVENOUS | Status: AC
  Administered 2016-03-01: 1000 mL via INTRAVENOUS

## 2016-03-01 MED ORDER — MORPHINE SULFATE (PF) 2 MG/ML IV SOLN
2.0000 mg | Freq: Once | INTRAVENOUS | Status: AC
  Administered 2016-03-01: 2 mg via INTRAVENOUS

## 2016-03-01 MED ORDER — ONDANSETRON HCL 4 MG/2ML IJ SOLN
4.0000 mg | Freq: Once | INTRAMUSCULAR | Status: AC
  Administered 2016-03-01: 4 mg via INTRAVENOUS

## 2016-03-01 MED ORDER — OXYCODONE-ACETAMINOPHEN 5-325 MG/5ML PO SOLN: 5.0000 mL | ORAL | Status: DC | PRN

## 2016-03-01 NOTE — ED Notes (Addendum)
Pt to to triage via w/c with no distress noted; pt nonverbal, only pointing when asked where pain is; family member registered pt as a fall; pt indicates pain to neck, chest and back; Dr Manson PasseyBrown to bedside and pt assisted on stretcher; pt placed on card monitor

## 2016-03-01 NOTE — Discharge Instructions (Signed)
Nonspecific Tachycardia Tachycardia is a faster than normal heartbeat (more than 100 beats per minute). In adults, the heart normally beats between 60 and 100 times a minute. A fast heartbeat may be a normal response to exercise or stress. It does not necessarily mean that something is wrong. However, sometimes when your heart beats too fast it may not be able to pump enough blood to the rest of your body. This can result in chest pain, shortness of breath, dizziness, and even fainting. Nonspecific tachycardia means that the specific cause or pattern of your tachycardia is unknown. CAUSES  Tachycardia may be harmless or it may be due to a more serious underlying cause. Possible causes of tachycardia include:  Exercise or exertion.  Fever.  Pain or injury.  Infection.  Loss of body fluids (dehydration).  Overactive thyroid.  Lack of red blood cells (anemia).  Anxiety and stress.  Alcohol.  Caffeine.  Tobacco products.  Diet pills.  Illegal drugs.  Heart disease. SYMPTOMS  Rapid or irregular heartbeat (palpitations).  Suddenly feeling your heart beating (cardiac awareness).  Dizziness.  Tiredness (fatigue).  Shortness of breath.  Chest pain.  Nausea.  Fainting. DIAGNOSIS  Your caregiver will perform a physical exam and take your medical history. In some cases, a heart specialist (cardiologist) may be consulted. Your caregiver may also order:  Blood tests.  Electrocardiography. This test records the electrical activity of your heart.  A heart monitoring test. TREATMENT  Treatment will depend on the likely cause of your tachycardia. The goal is to treat the underlying cause of your tachycardia. Treatment methods may include:  Replacement of fluids or blood through an intravenous (IV) tube for moderate to severe dehydration or anemia.  New medicines or changes in your current medicines.  Diet and lifestyle changes.  Treatment for certain  infections.  Stress relief or relaxation methods. HOME CARE INSTRUCTIONS   Rest.  Drink enough fluids to keep your urine clear or pale yellow.  Do not smoke.  Avoid:  Caffeine.  Tobacco.  Alcohol.  Chocolate.  Stimulants such as over-the-counter diet pills or pills that help you stay awake.  Situations that cause anxiety or stress.  Illegal drugs such as marijuana, phencyclidine (PCP), and cocaine.  Only take medicine as directed by your caregiver.  Keep all follow-up appointments as directed by your caregiver. SEEK IMMEDIATE MEDICAL CARE IF:   You have pain in your chest, upper arms, jaw, or neck.  You become weak, dizzy, or feel faint.  You have palpitations that will not go away.  You vomit, have diarrhea, or pass blood in your stool.  Your skin is cool, pale, and wet.  You have a fever that will not go away with rest, fluids, and medicine. MAKE SURE YOU:   Understand these instructions.  Will watch your condition.  Will get help right away if you are not doing well or get worse.   This information is not intended to replace advice given to you by your health care provider. Make sure you discuss any questions you have with your health care provider.   Document Released: 10/26/2004 Document Revised: 12/11/2011 Document Reviewed: 04/02/2015 Elsevier Interactive Patient Education 2016 Elsevier Inc.  

## 2016-03-01 NOTE — ED Notes (Signed)
Discharge instructions reviewed with patient and family member. Patient and family member verbalized understanding. Patient ambulated to lobby without difficulty.

## 2016-03-01 NOTE — ED Notes (Signed)
Patient transported to X-ray 

## 2016-03-01 NOTE — ED Notes (Signed)
Pt placed on 2L nasal cannula to maintain O2 sat >92% per MD

## 2016-03-01 NOTE — ED Notes (Signed)
Pt in via triage; pt family member reports pt fell at home.  Pt able to speak some words; pt reports feeling dizzy when he stood up at home and then passing out.  Pt restless, uncomfortable, points to neck when asked about pain.  Pt alert, tachycardic upon arrival.

## 2016-03-01 NOTE — ED Provider Notes (Signed)
[facility] Emergency Department Provider Note  ____________________________________________  Time seen: 12:50 AM  I have reviewed the triage vital signs and the nursing notes.   HISTORY  Chief Complaint Fall     HPI Randy Roach is a 40 y.o. male with history of ALS presents status post accident AND fall at home today with resultant posterior neck, chest right shoulder and back pain. Patient current pain score 8 out of 10. Patient denies any worsening weakness or difficulty breathing     Past Medical History  Diagnosis Date  . Diabetes mellitus without complication (HCC)   . ALS (amyotrophic lateral sclerosis) (HCC)     There are no active problems to display for this patient.   Past Surgical History  Procedure Laterality Date  . Appendectomy    . Peg tube placement Left     Current Outpatient Rx  Name  Route  Sig  Dispense  Refill  . Chlorpheniramine-DM (CORICIDIN HBP COUGH/COLD PO)   Oral   Take by mouth See admin instructions. Pt states that he took 32 tablets this morning.  (04/26/15)         . HYDROcodone-acetaminophen (HYCET) 7.5-325 mg/15 ml solution   Per Tube   Place 10 mLs into feeding tube every 6 (six) hours as needed for moderate pain.   120 mL   0   . magnesium oxide (MAG-OX) 400 MG tablet   Oral   Take 400 mg by mouth 2 (two) times daily.         . metFORMIN (GLUCOPHAGE) 1000 MG tablet   Oral   Take 500 mg by mouth 2 (two) times daily.          . Multiple Vitamins-Minerals (MULTIVITAMINS THER. W/MINERALS) TABS tablet   Oral   Take 1 tablet by mouth daily.           Allergies No known drug allergies No family history on file.  Social History Social History  Substance Use Topics  . Smoking status: Current Every Day Smoker -- 1.00 packs/day    Types: Cigarettes  . Smokeless tobacco: Not on file  . Alcohol Use: No     Comment: occ    Review of Systems  Constitutional: Negative for  fever. Eyes: Negative for visual changes. ENT: Negative for sore throat. Cardiovascular: Positive for chest pain. Respiratory: Negative for shortness of breath. Gastrointestinal: Negative for abdominal pain, vomiting and diarrhea. Genitourinary: Negative for dysuria. Musculoskeletal: Positive for back pain. Skin: Negative for rash. Neurological: Negative for headaches, focal weakness or numbness.   10-point ROS otherwise negative.  ____________________________________________   PHYSICAL EXAM:  VITAL SIGNS: ED Triage Vitals  Enc Vitals Group     BP 03/01/16 0035 117/79 mmHg     Pulse Rate 03/01/16 0035 146     Resp 03/01/16 0035 20     Temp 03/01/16 0035 98.9 F (37.2 C)     Temp Source 03/01/16 0035 Oral     SpO2 03/01/16 0035 95 %     Weight 03/01/16 0035 114 lb (51.71 kg)     Height 03/01/16 0035 5\' 7"  (1.702 m)     Head Cir --      Peak Flow --      Pain Score --      Pain Loc --      Pain Edu? --      Excl. in GC? --      Constitutional: Alert and oriented. Well appearing and in no distress.  Eyes: Conjunctivae are normal. PERRL. Normal extraocular movements. ENT   Head: Normocephalic and atraumatic.   Nose: No congestion/rhinnorhea.   Mouth/Throat: Mucous membranes are moist.   Neck: No stridor. Hematological/Lymphatic/Immunilogical: No cervical lymphadenopathy. Cardiovascular: Normal rate, regular rhythm. Normal and symmetric distal pulses are present in all extremities. No murmurs, rubs, or gallops. Respiratory: Normal respiratory effort without tachypnea nor retractions. Breath sounds are clear and equal bilaterally. No wheezes/rales/rhonchi. Gastrointestinal: Soft and nontender. No distention. There is no CVA tenderness. Genitourinary: deferred Musculoskeletal: Nontender with normal range of motion in all extremities. No joint effusions.  No lower extremity tenderness nor edema. Neurologic:  Normal speech and language. No gross focal neurologic  deficits are appreciated. Speech is normal.  Skin:  Skin is warm, dry and intact. No rash noted. Psychiatric: Mood and affect are normal. Speech and behavior are normal. Patient exhibits appropriate insight and judgment.  ____________________________________________    LABS (pertinent positives/negatives)  Labs Reviewed  CBC - Abnormal; Notable for the following:    HCT 39.8 (*)    All other components within normal limits  COMPREHENSIVE METABOLIC PANEL - Abnormal; Notable for the following:    Sodium 134 (*)    Chloride 90 (*)    CO2 35 (*)    Glucose, Bld 176 (*)    Creatinine, Ser 0.46 (*)    All other components within normal limits  URINALYSIS COMPLETEWITH MICROSCOPIC ([facility] ONLY) - Abnormal; Notable for the following:    Color, Urine STRAW (*)    APPearance CLEAR (*)    Glucose, UA 50 (*)    Specific Gravity, Urine 1.004 (*)    All other components within normal limits  CULTURE, BLOOD (ROUTINE X 2)  CULTURE, BLOOD (ROUTINE X 2)  TROPONIN I  FIBRIN DERIVATIVES D-DIMER ([facility] ONLY)  LACTIC ACID, PLASMA  TSH     ____________________________________________   EKG  ED ECG REPORT I, Rosedale N BROWN, the attending physician, personally viewed and interpreted this ECG.   Date: 03/01/2016  EKG Time: 12:51AM  Rate: 137  Rhythm: Sinus tachycardia  Axis: Normal  Intervals:Normal  ST&T Change: None   ____________________________________________    RADIOLOGY  DG Thoracic Spine 2 View (Final result) Result time: 03/01/16 02:17:51   Final result by Rad Results In Interface (03/01/16 02:17:51)   Narrative:   CLINICAL DATA: Status post fall, with upper back pain. Initial encounter.  EXAM: THORACIC SPINE 2 VIEWS  COMPARISON: Chest radiograph performed 04/26/2015  FINDINGS: There is no evidence of fracture or subluxation. Vertebral bodies demonstrate normal height and alignment. Intervertebral disc spaces are preserved.  The visualized portions of both  lungs are clear. The mediastinum is unremarkable in appearance.  IMPRESSION: No evidence of fracture or subluxation along the thoracic spine.   Electronically Signed By: Roanna Raider M.D. On: 03/01/2016 02:17          DG Chest 2 View (Final result) Result time: 03/01/16 02:14:51   Final result by Rad Results In Interface (03/01/16 02:14:51)   Narrative:   CLINICAL DATA: Status post fall. Concern for chest injury. Mid upper back pain. Initial encounter.  EXAM: CHEST 2 VIEW  COMPARISON: Chest radiograph performed 04/26/2015  FINDINGS: The lungs are well-aerated. Mild left basilar opacity likely reflects atelectasis. There is no evidence of pleural effusion or pneumothorax.  The heart is normal in size; the mediastinal contour is within normal limits. No acute osseous abnormalities are seen. A G-tube is partially imaged.  IMPRESSION: Mild left basilar opacity likely reflects atelectasis. No displaced  rib fracture seen.   Electronically Signed By: Roanna Raider M.D. On: 03/01/2016 02:14          CT Cervical Spine Wo Contrast (Final result) Result time: 03/01/16 02:03:02   Final result by Rad Results In Interface (03/01/16 02:03:02)   Narrative:   CLINICAL DATA: Diffuse pain after fall.  EXAM: CT CERVICAL SPINE WITHOUT CONTRAST  TECHNIQUE: Multidetector CT imaging of the cervical spine was performed without intravenous contrast. Multiplanar CT image reconstructions were also generated.  COMPARISON: Cervical spine CT 02/08/2016  FINDINGS: No fracture or acute subluxation. The dens is intact. There are no jumped or perched facets. Minimal disc space narrowing and endplate spurring at C5-C6. No prevertebral soft tissue edema. Retained secretions in the pharynx noted.  IMPRESSION: 1. No acute fracture or subluxation of the cervical spine. 2. Retained secretions in the pharynx.   Electronically Signed By: Rubye Oaks  M.D. On: 03/01/2016 02:03     INITIAL IMPRESSION / ASSESSMENT AND PLAN / ED COURSE  Pertinent labs & imaging results that were available during my care of the patient were reviewed by me and considered in my medical decision making (see chart for details).  Patient states that he has a history of tachycardia. Patient has been persistently tachycardic in the emergency department current heart rate 121 which she states is normal for him.  ____________________________________________   FINAL CLINICAL IMPRESSION(S) / ED DIAGNOSES  Final diagnoses:  Tachycardia  Contusion      Darci Current, MD 03/01/16 [phone]

## 2016-03-06 LAB — CULTURE, BLOOD (ROUTINE X 2)
CULTURE: NO GROWTH
Culture: NO GROWTH

## 2016-03-09 DIAGNOSIS — E46 Unspecified protein-calorie malnutrition: Secondary | ICD-10-CM | POA: Insufficient documentation

## 2016-03-09 DIAGNOSIS — R0689 Other abnormalities of breathing: Secondary | ICD-10-CM | POA: Insufficient documentation

## 2016-03-09 DIAGNOSIS — R52 Pain, unspecified: Secondary | ICD-10-CM | POA: Insufficient documentation

## 2016-05-08 ENCOUNTER — Emergency Department

## 2016-05-08 ENCOUNTER — Inpatient Hospital Stay
Admission: EM | Admit: 2016-05-08 | Discharge: 2016-05-11 | DRG: 871 | Disposition: A | Discharge status: Other Acute Inpatient Hospital | Attending: Internal Medicine | Admitting: Internal Medicine

## 2016-05-08 DIAGNOSIS — Z931 Gastrostomy status: Secondary | ICD-10-CM

## 2016-05-08 DIAGNOSIS — G1221 Amyotrophic lateral sclerosis: Secondary | ICD-10-CM | POA: Diagnosis present

## 2016-05-08 DIAGNOSIS — J690 Pneumonitis due to inhalation of food and vomit: Secondary | ICD-10-CM | POA: Diagnosis present

## 2016-05-08 DIAGNOSIS — Z452 Encounter for adjustment and management of vascular access device: Secondary | ICD-10-CM

## 2016-05-08 DIAGNOSIS — J189 Pneumonia, unspecified organism: Secondary | ICD-10-CM | POA: Diagnosis present

## 2016-05-08 DIAGNOSIS — J9601 Acute respiratory failure with hypoxia: Secondary | ICD-10-CM | POA: Diagnosis not present

## 2016-05-08 DIAGNOSIS — J9600 Acute respiratory failure, unspecified whether with hypoxia or hypercapnia: Secondary | ICD-10-CM

## 2016-05-08 DIAGNOSIS — F17210 Nicotine dependence, cigarettes, uncomplicated: Secondary | ICD-10-CM | POA: Diagnosis present

## 2016-05-08 DIAGNOSIS — A419 Sepsis, unspecified organism: Secondary | ICD-10-CM | POA: Diagnosis present

## 2016-05-08 DIAGNOSIS — Z79891 Long term (current) use of opiate analgesic: Secondary | ICD-10-CM | POA: Diagnosis not present

## 2016-05-08 DIAGNOSIS — E876 Hypokalemia: Secondary | ICD-10-CM | POA: Diagnosis present

## 2016-05-08 DIAGNOSIS — Z79899 Other long term (current) drug therapy: Secondary | ICD-10-CM | POA: Diagnosis not present

## 2016-05-08 DIAGNOSIS — E119 Type 2 diabetes mellitus without complications: Secondary | ICD-10-CM | POA: Diagnosis present

## 2016-05-08 DIAGNOSIS — T884XXA Failed or difficult intubation, initial encounter: Secondary | ICD-10-CM

## 2016-05-08 DIAGNOSIS — Z01818 Encounter for other preprocedural examination: Secondary | ICD-10-CM

## 2016-05-08 LAB — CBC WITH DIFFERENTIAL/PLATELET
BASOS ABS: 0 10*3/uL (ref 0–0.1)
BASOS PCT: 0 %
EOS ABS: 0 10*3/uL (ref 0–0.7)
EOS PCT: 0 %
HCT: 42 % (ref 40.0–52.0)
HEMOGLOBIN: 14 g/dL (ref 13.0–18.0)
Lymphocytes Relative: 5 %
Lymphs Abs: 0.7 10*3/uL — ABNORMAL LOW (ref 1.0–3.6)
MCH: 29.8 pg (ref 26.0–34.0)
MCHC: 33.4 g/dL (ref 32.0–36.0)
MCV: 89.2 fL (ref 80.0–100.0)
Monocytes Absolute: 1.2 10*3/uL — ABNORMAL HIGH (ref 0.2–1.0)
Monocytes Relative: 8 %
NEUTROS PCT: 87 %
Neutro Abs: 13.8 10*3/uL — ABNORMAL HIGH (ref 1.4–6.5)
PLATELETS: 232 10*3/uL (ref 150–440)
RBC: 4.7 MIL/uL (ref 4.40–5.90)
RDW: 12.9 % (ref 11.5–14.5)
WBC: 15.7 10*3/uL — AB (ref 3.8–10.6)

## 2016-05-08 LAB — COMPREHENSIVE METABOLIC PANEL
ALK PHOS: 68 U/L (ref 38–126)
ALT: 17 U/L (ref 17–63)
AST: 15 U/L (ref 15–41)
Albumin: 4 g/dL (ref 3.5–5.0)
Anion gap: 10 (ref 5–15)
BILIRUBIN TOTAL: 0.8 mg/dL (ref 0.3–1.2)
BUN: 14 mg/dL (ref 6–20)
CALCIUM: 9.7 mg/dL (ref 8.9–10.3)
CHLORIDE: 89 mmol/L — AB (ref 101–111)
CO2: 36 mmol/L — ABNORMAL HIGH (ref 22–32)
CREATININE: 0.39 mg/dL — AB (ref 0.61–1.24)
Glucose, Bld: 161 mg/dL — ABNORMAL HIGH (ref 65–99)
Potassium: 4 mmol/L (ref 3.5–5.1)
Sodium: 135 mmol/L (ref 135–145)
TOTAL PROTEIN: 7.5 g/dL (ref 6.5–8.1)

## 2016-05-08 LAB — TROPONIN I

## 2016-05-08 LAB — LACTIC ACID, PLASMA: LACTIC ACID, VENOUS: 0.8 mmol/L (ref 0.5–1.9)

## 2016-05-08 MED ORDER — VANCOMYCIN HCL IN DEXTROSE 1-5 GM/200ML-% IV SOLN
1000.0000 mg | Freq: Once | INTRAVENOUS | Status: AC
  Administered 2016-05-08: 1000 mg via INTRAVENOUS
  Filled 2016-05-08: qty 200

## 2016-05-08 MED ORDER — SODIUM CHLORIDE 0.9 % IV BOLUS (SEPSIS)
500.0000 mL | Freq: Once | INTRAVENOUS | Status: AC
  Administered 2016-05-08: 500 mL via INTRAVENOUS

## 2016-05-08 MED ORDER — RILUZOLE 50 MG PO TABS
50.0000 mg | ORAL_TABLET | Freq: Two times a day (BID) | ORAL | Status: DC
  Administered 2016-05-10: 50 mg
  Filled 2016-05-08 (×6): qty 1

## 2016-05-08 MED ORDER — DEXTROSE 5 % IV SOLN
2.0000 g | Freq: Once | INTRAVENOUS | Status: AC
  Administered 2016-05-08: 2 g via INTRAVENOUS
  Filled 2016-05-08: qty 2

## 2016-05-08 MED ORDER — MORPHINE SULFATE (PF) 4 MG/ML IV SOLN
4.0000 mg | Freq: Once | INTRAVENOUS | Status: AC
  Administered 2016-05-08: 4 mg via INTRAVENOUS
  Filled 2016-05-08: qty 1

## 2016-05-08 MED ORDER — SODIUM CHLORIDE 0.9 % IV BOLUS (SEPSIS)
250.0000 mL | Freq: Once | INTRAVENOUS | Status: AC
  Administered 2016-05-08: 250 mL via INTRAVENOUS

## 2016-05-08 MED ORDER — HYDROCODONE-ACETAMINOPHEN 10-325 MG PO TABS
1.0000 | ORAL_TABLET | ORAL | Status: DC | PRN
  Administered 2016-05-09 (×2): 2
  Filled 2016-05-08 (×2): qty 2

## 2016-05-08 MED ORDER — SODIUM CHLORIDE 0.9 % IV BOLUS (SEPSIS)
1000.0000 mL | Freq: Once | INTRAVENOUS | Status: AC
  Administered 2016-05-08: 1000 mL via INTRAVENOUS
  Filled 2016-05-08: qty 1000

## 2016-05-08 MED ORDER — SODIUM CHLORIDE 0.9 % IV SOLN
Freq: Once | INTRAVENOUS | Status: AC
  Administered 2016-05-09: 01:00:00 via INTRAVENOUS

## 2016-05-08 MED ORDER — ONDANSETRON HCL 4 MG/2ML IJ SOLN
4.0000 mg | Freq: Once | INTRAMUSCULAR | Status: AC
  Administered 2016-05-08: 4 mg via INTRAVENOUS
  Filled 2016-05-08: qty 2

## 2016-05-08 NOTE — ED Notes (Signed)
Pt transported to 1C room 116.

## 2016-05-08 NOTE — ED Notes (Signed)
Dr Langston MaskerShaevitz at bedside.  Discussed plan for admission with patient.  Communicated pt's need for pain medication.  Orders received.

## 2016-05-08 NOTE — ED Notes (Signed)
Report called to Jackie, RN.

## 2016-05-08 NOTE — ED Triage Notes (Signed)
Hospice pt w/ ALS checked on by mother, Velna HatchetSheila, today and communicated he wasn't feeling well.  Reports nausea, abd pain, increased difficulty breathing and foul smelling yellowish-green sputum.  Mother called Hospice and was told to call EMS.

## 2016-05-08 NOTE — ED Notes (Signed)
Pt states he urinates without difficulty.  Explained the order for UA and to call when he needs to void so we can assist with collection.  Pt verbalizes understanding.

## 2016-05-08 NOTE — ED Provider Notes (Signed)
[facility] Emergency Department Provider Note   ____________________________________________   First MD Initiated Contact with Patient 05/08/16 1955     (approximate)  I have reviewed the triage vital signs and the nursing notes.   HISTORY  Chief Complaint Abdominal Pain; Shortness of Breath; and Nausea   HPI Randy Roach is a 40 y.o. male with a history of ALS as well as diabetes on hospice was presenting to the emergency department today with shortness of breath, cough as well as nausea. He was found to have a fever at home. The family called hospice and the hospice recommended that he come to the emergency department for further evaluation. At his baseline he is nonverbal and unable to lift his head. However he has cognitively intact and texts on his phone to communicate. He is denying any abdominal pain or chest pain at this time. Says that he has had a cough productive of green sputum.  Past Medical History:  Diagnosis Date  . ALS (amyotrophic lateral sclerosis) (HCC)   . Diabetes mellitus without complication (HCC)     There are no active problems to display for this patient.   Past Surgical History:  Procedure Laterality Date  . APPENDECTOMY    . PEG TUBE PLACEMENT Left     Prior to Admission medications   Medication Sig Start Date End Date Taking? Authorizing Provider  Amitriptyline HCl (ELAVIL PO) Take 25 mg by mouth at bedtime. Patient is taking a suspension. 12/24/15  Yes Historical Provider, MD  fentaNYL (DURAGESIC - DOSED MCG/HR) 12 MCG/HR Place 12.5 mcg onto the skin every 3 (three) days.   Yes Historical Provider, MD  HYDROcodone-acetaminophen (NORCO) 10-325 MG tablet Take 1 tablet by mouth every 4 (four) hours as needed.   Yes Historical Provider, MD  predniSONE (STERAPRED UNI-PAK 21 TAB) 10 MG (21) TBPK tablet Take 10 mg by mouth daily. day 1 - 2 before breakfast, 1 after lunch, 1 after supper, and 2 at bedtime, day 2 - 1  before breakfast, 1 after lunch, 1 after supper, and 2 at bedtime, day 3 - 1 before breakfast, 1 after lunch, 1 after supper, and 1 at bedtime, day 4 - 1 before breakfast, 1 after lunch, and 1 at bedtime, day 5 - 1 before breakfast, and 1 at bedtime, day 6 - 1 before breakfast   Yes Historical Provider, MD  riluzole (RILUTEK) 50 MG tablet Take 50 mg by mouth every 12 (twelve) hours.   Yes Historical Provider, MD    Allergies Review of patient's allergies indicates no known allergies.  History reviewed. No pertinent family history.  Social History Social History  Substance Use Topics  . Smoking status: Current Every Day Smoker    Packs/day: 1.00    Types: Cigarettes  . Smokeless tobacco: Never Used  . Alcohol use No     Comment: occ    Review of Systems Constitutional: Fever Eyes: No visual changes. ENT: No sore throat. Cardiovascular: Denies chest pain. Respiratory: As above Gastrointestinal: No abdominal pain.  no vomiting.  No diarrhea.  No constipation. Genitourinary: Negative for dysuria. Musculoskeletal: Negative for back pain. Skin: Negative for rash. Neurological: Negative for headaches, focal weakness or numbness.  10-point ROS otherwise negative.  ____________________________________________   PHYSICAL EXAM:  VITAL SIGNS: ED Triage Vitals  Enc Vitals Group     BP      Pulse      Resp      Temp  Temp src      SpO2      Weight      Height      Head Circumference      Peak Flow      Pain Score      Pain Loc      Pain Edu?      Excl. in GC?     Constitutional: Alert.  Head hanging stew his chest. Eyes: Conjunctivae are normal. PERRL. EOMI. Head: Atraumatic. Nose: No congestion/rhinnorhea. Mouth/Throat: Mucous membranes are moist.   Neck: No stridor.   Cardiovascular: Tachycardic, regular rhythm. Grossly normal heart sounds.   Respiratory: Normal respiratory effort.  No retractions. Rales to the left upper as well as right lower  fields. Gastrointestinal: Soft and nontender. No distention. No CVA tenderness.  PEG tube is in place to the left upper quadrant without any surrounding erythema, induration or pus around the site of the stoma. Musculoskeletal: No lower extremity tenderness nor edema.  No joint effusions. Neurologic: No facial asymmetry. No obvious focal weakness. Skin:  Skin is warm, dry and intact. No rash noted.   ____________________________________________   LABS (all labs ordered are listed, but only abnormal results are displayed)  Labs Reviewed  CBC WITH DIFFERENTIAL/PLATELET - Abnormal; Notable for the following:       Result Value   WBC 15.7 (*)    Neutro Abs 13.8 (*)    Lymphs Abs 0.7 (*)    Monocytes Absolute 1.2 (*)    All other components within normal limits  CULTURE, BLOOD (ROUTINE X 2)  CULTURE, BLOOD (ROUTINE X 2)  URINE CULTURE  LACTIC ACID, PLASMA  COMPREHENSIVE METABOLIC PANEL  LACTIC ACID, PLASMA  TROPONIN I  URINALYSIS COMPLETEWITH MICROSCOPIC ([facility] ONLY)   ____________________________________________  EKG  ED ECG REPORT I, Arelia LongestSchaevitz,  Tayja Manzer M, the attending physician, personally viewed and interpreted this ECG.   Date: 05/08/2016  EKG Time: 2000  Rate: 153  Rhythm: sinus tachycardia  Axis: Right axis deviation  Intervals:none  ST&T Change: No ST segment elevation or depression. No abnormal T-wave inversion.  ____________________________________________  RADIOLOGY  DG Chest [place] (Accession [id number]) (Order [id number])  Imaging  Date: 05/08/2016 Department: [facility] EMERGENCY DEPARTMENT Released By/Authorizing: Myrna Blazeravid Matthew Ardis Lawley, MD (auto-released)  PACS Images   [facility]  Study Result   CLINICAL DATA:  Productive cough.  EXAM: PORTABLE CHEST 1 VIEW  COMPARISON:  Radiographs of Mar 01, 2016.  FINDINGS: The heart size and mediastinal contours are within normal limits. Interval  development of bibasilar opacities, left greater than right, concerning for pneumonia. No pneumothorax or pleural effusion is noted. The visualized skeletal structures are unremarkable.  IMPRESSION: Interval development of bibasilar opacities concerning for pneumonia, left greater than right.   Electronically Signed   By: Lupita RaiderJames  Green Jr, M.D.   On: 05/08/2016 20:18     ____________________________________________   PROCEDURES  Procedure(s) performed:   Procedures  Critical Care performed:   ____________________________________________   INITIAL IMPRESSION / ASSESSMENT AND PLAN / ED COURSE  Pertinent labs & imaging results that were available during my care of the patient were reviewed by me and considered in my medical decision making (see chart for details).  Sepsis alert called. Patient's mother is also the bedside and says that if need be that the patient is okay to be hospitalized even though he is on hospice.  Clinical Course   ----------------------------------------- 9:15 PM on 05/08/2016 -----------------------------------------  Patient with now heart rate in the 140s. Showing bilateral basilar pneumonia. We'll admit to the hospital for pneumonia treatment. Signed out to Dr. Emmit Pomfret. Patient and family updated about the plan for admission and monitoring to comply. While the patient would likely qualify for community-acquired pneumonia antibiotics he'll be getting the healthcare associated pneumonia antibiotics because of the degree of illness.  ____________________________________________   FINAL CLINICAL IMPRESSION(S) / ED DIAGNOSES  CAP.  Sepsis.    NEW MEDICATIONS STARTED DURING THIS VISIT:  New Prescriptions   No medications on file     Note:  This document was prepared using Dragon voice recognition software and may include unintentional dictation errors.    Myrna Blazer, MD 05/08/16 2118

## 2016-05-09 ENCOUNTER — Inpatient Hospital Stay

## 2016-05-09 DIAGNOSIS — A419 Sepsis, unspecified organism: Principal | ICD-10-CM

## 2016-05-09 DIAGNOSIS — J189 Pneumonia, unspecified organism: Secondary | ICD-10-CM

## 2016-05-09 LAB — GLUCOSE, CAPILLARY
GLUCOSE-CAPILLARY: 192 mg/dL — AB (ref 65–99)
GLUCOSE-CAPILLARY: 209 mg/dL — AB (ref 65–99)
GLUCOSE-CAPILLARY: 113 mg/dL — AB (ref 65–99)
Glucose-Capillary: 154 mg/dL — ABNORMAL HIGH (ref 65–99)

## 2016-05-09 LAB — LACTIC ACID, PLASMA: LACTIC ACID, VENOUS: 0.9 mmol/L (ref 0.5–1.9)

## 2016-05-09 LAB — COMPREHENSIVE METABOLIC PANEL
ALT: 13 U/L — ABNORMAL LOW (ref 17–63)
AST: 16 U/L (ref 15–41)
Albumin: 2.9 g/dL — ABNORMAL LOW (ref 3.5–5.0)
Alkaline Phosphatase: 59 U/L (ref 38–126)
Anion gap: 6 (ref 5–15)
BILIRUBIN TOTAL: 0.6 mg/dL (ref 0.3–1.2)
BUN: 10 mg/dL (ref 6–20)
CHLORIDE: 94 mmol/L — AB (ref 101–111)
CO2: 38 mmol/L — ABNORMAL HIGH (ref 22–32)
CREATININE: 0.38 mg/dL — AB (ref 0.61–1.24)
Calcium: 8.8 mg/dL — ABNORMAL LOW (ref 8.9–10.3)
Glucose, Bld: 101 mg/dL — ABNORMAL HIGH (ref 65–99)
POTASSIUM: 3.4 mmol/L — AB (ref 3.5–5.1)
Sodium: 138 mmol/L (ref 135–145)
TOTAL PROTEIN: 5.8 g/dL — AB (ref 6.5–8.1)

## 2016-05-09 LAB — BLOOD GAS, ARTERIAL
ACID-BASE EXCESS: 11 mmol/L — AB (ref 0.0–3.0)
Allens test (pass/fail): POSITIVE — AB
BICARBONATE: 36.8 meq/L — AB (ref 21.0–28.0)
FIO2: 35
LHR: 15 {breaths}/min
MECHVT: 500 mL
O2 SAT: 99.2 %
PATIENT TEMPERATURE: 37
PCO2 ART: 53 mmHg — AB (ref 32.0–48.0)
PEEP/CPAP: 5 cmH2O
PH ART: 7.45 (ref 7.350–7.450)
PO2 ART: 134 mmHg — AB (ref 83.0–108.0)

## 2016-05-09 LAB — URINALYSIS COMPLETE WITH MICROSCOPIC (ARMC ONLY)
BACTERIA UA: NONE SEEN
BILIRUBIN URINE: NEGATIVE
GLUCOSE, UA: NEGATIVE mg/dL
LEUKOCYTES UA: NEGATIVE
Nitrite: NEGATIVE
Protein, ur: NEGATIVE mg/dL
Specific Gravity, Urine: 1.023 (ref 1.005–1.030)
pH: 7 (ref 5.0–8.0)

## 2016-05-09 LAB — CBC
HEMATOCRIT: 33.5 % — AB (ref 40.0–52.0)
Hemoglobin: 11.6 g/dL — ABNORMAL LOW (ref 13.0–18.0)
MCH: 30.5 pg (ref 26.0–34.0)
MCHC: 34.6 g/dL (ref 32.0–36.0)
MCV: 88.1 fL (ref 80.0–100.0)
PLATELETS: 222 10*3/uL (ref 150–440)
RBC: 3.8 MIL/uL — AB (ref 4.40–5.90)
RDW: 12.7 % (ref 11.5–14.5)
WBC: 10.3 10*3/uL (ref 3.8–10.6)

## 2016-05-09 LAB — TRIGLYCERIDES: TRIGLYCERIDES: 97 mg/dL (ref <150)

## 2016-05-09 LAB — STREP PNEUMONIAE URINARY ANTIGEN: Strep Pneumo Urinary Antigen: NEGATIVE

## 2016-05-09 LAB — MRSA PCR SCREENING: MRSA by PCR: NEGATIVE

## 2016-05-09 MED ORDER — ANTISEPTIC ORAL RINSE SOLUTION (CORINZ)
7.0000 mL | Freq: Four times a day (QID) | OROMUCOSAL | Status: DC
  Administered 2016-05-09 – 2016-05-11 (×7): 7 mL via OROMUCOSAL
  Filled 2016-05-09 (×8): qty 7

## 2016-05-09 MED ORDER — FENTANYL 12 MCG/HR TD PT72: 12.5000 ug | MEDICATED_PATCH | TRANSDERMAL | Status: DC

## 2016-05-09 MED ORDER — NOREPINEPHRINE BITARTRATE 1 MG/ML IV SOLN: 0.0000 ug/min | INTRAVENOUS | Status: DC

## 2016-05-09 MED ORDER — ONDANSETRON HCL 4 MG PO TABS: 4.0000 mg | ORAL_TABLET | Freq: Four times a day (QID) | ORAL | Status: DC | PRN

## 2016-05-09 MED ORDER — SODIUM CHLORIDE 0.9 % IV SOLN
INTRAVENOUS | Status: DC
  Administered 2016-05-09 – 2016-05-10 (×2): via INTRAVENOUS

## 2016-05-09 MED ORDER — AMITRIPTYLINE HCL 50 MG PO TABS
25.0000 mg | ORAL_TABLET | Freq: Every day | ORAL | Status: DC
  Administered 2016-05-09 – 2016-05-10 (×3): 25 mg via ORAL
  Filled 2016-05-09 (×3): qty 1

## 2016-05-09 MED ORDER — FENTANYL CITRATE (PF) 100 MCG/2ML IJ SOLN
100.0000 ug | Freq: Once | INTRAMUSCULAR | Status: AC
  Administered 2016-05-09: 100 ug via INTRAVENOUS

## 2016-05-09 MED ORDER — ACETAMINOPHEN 650 MG RE SUPP: 650.0000 mg | Freq: Four times a day (QID) | RECTAL | Status: DC | PRN

## 2016-05-09 MED ORDER — DEXTROSE 5 % IV SOLN
2.0000 g | Freq: Three times a day (TID) | INTRAVENOUS | Status: DC
  Administered 2016-05-09 – 2016-05-10 (×4): 2 g via INTRAVENOUS
  Filled 2016-05-09 (×6): qty 2

## 2016-05-09 MED ORDER — OSMOLITE 1.5 CAL PO LIQD
237.0000 mL | Freq: Three times a day (TID) | ORAL | Status: DC
  Administered 2016-05-09: 237 mL

## 2016-05-09 MED ORDER — MIDAZOLAM HCL 2 MG/2ML IJ SOLN
2.0000 mg | INTRAMUSCULAR | Status: DC | PRN
  Administered 2016-05-09 – 2016-05-11 (×4): 2 mg via INTRAVENOUS
  Filled 2016-05-09 (×4): qty 2

## 2016-05-09 MED ORDER — VECURONIUM BROMIDE 10 MG IV SOLR
INTRAVENOUS | Status: AC
  Administered 2016-05-09: 5 mg via INTRAVENOUS
  Filled 2016-05-09: qty 10

## 2016-05-09 MED ORDER — NOREPINEPHRINE 4 MG/250ML-% IV SOLN
0.0000 ug/min | INTRAVENOUS | Status: DC
  Administered 2016-05-09: 10 ug/min via INTRAVENOUS
  Filled 2016-05-09: qty 250

## 2016-05-09 MED ORDER — VANCOMYCIN HCL IN DEXTROSE 750-5 MG/150ML-% IV SOLN
750.0000 mg | Freq: Two times a day (BID) | INTRAVENOUS | Status: DC
  Administered 2016-05-09 – 2016-05-10 (×3): 750 mg via INTRAVENOUS
  Filled 2016-05-09 (×5): qty 150

## 2016-05-09 MED ORDER — ENOXAPARIN SODIUM 40 MG/0.4ML ~~LOC~~ SOLN
40.0000 mg | SUBCUTANEOUS | Status: DC
  Administered 2016-05-09: 40 mg via SUBCUTANEOUS
  Filled 2016-05-09: qty 0.4

## 2016-05-09 MED ORDER — FENTANYL CITRATE (PF) 100 MCG/2ML IJ SOLN: 50.0000 ug | Freq: Once | INTRAMUSCULAR | Status: DC

## 2016-05-09 MED ORDER — SODIUM CHLORIDE 0.9 % IV SOLN
25.0000 ug/h | INTRAVENOUS | Status: DC
  Filled 2016-05-09: qty 50

## 2016-05-09 MED ORDER — INSULIN ASPART 100 UNIT/ML ~~LOC~~ SOLN: 0.0000 [IU] | Freq: Three times a day (TID) | SUBCUTANEOUS | Status: DC

## 2016-05-09 MED ORDER — ACETAMINOPHEN 325 MG PO TABS: 650.0000 mg | ORAL_TABLET | Freq: Four times a day (QID) | ORAL | Status: DC | PRN

## 2016-05-09 MED ORDER — IPRATROPIUM-ALBUTEROL 0.5-2.5 (3) MG/3ML IN SOLN: 3.0000 mL | RESPIRATORY_TRACT | Status: DC | PRN

## 2016-05-09 MED ORDER — FENTANYL 2500MCG IN NS 250ML (10MCG/ML) PREMIX INFUSION
25.0000 ug/h | INTRAVENOUS | Status: DC
  Administered 2016-05-09: 50 ug/h via INTRAVENOUS
  Administered 2016-05-10: 250 ug/h via INTRAVENOUS
  Administered 2016-05-10 (×2): 25 ug/h via INTRAVENOUS
  Administered 2016-05-11 (×2): 275 ug/h via INTRAVENOUS
  Filled 2016-05-09 (×5): qty 250

## 2016-05-09 MED ORDER — PROPOFOL 1000 MG/100ML IV EMUL: 5.0000 ug/kg/min | INTRAVENOUS | Status: DC

## 2016-05-09 MED ORDER — VECURONIUM BROMIDE 10 MG IV SOLR
5.0000 mg | Freq: Once | INTRAVENOUS | Status: AC
  Administered 2016-05-09: 5 mg via INTRAVENOUS

## 2016-05-09 MED ORDER — SODIUM CHLORIDE 0.9 % IV BOLUS (SEPSIS)
500.0000 mL | Freq: Once | INTRAVENOUS | Status: AC
  Administered 2016-05-09: 500 mL via INTRAVENOUS

## 2016-05-09 MED ORDER — CHLORHEXIDINE GLUCONATE 0.12% ORAL RINSE (MEDLINE KIT)
15.0000 mL | Freq: Two times a day (BID) | OROMUCOSAL | Status: DC
  Administered 2016-05-09 – 2016-05-11 (×4): 15 mL via OROMUCOSAL
  Filled 2016-05-09 (×5): qty 15

## 2016-05-09 MED ORDER — ONDANSETRON HCL 4 MG/2ML IJ SOLN
4.0000 mg | Freq: Four times a day (QID) | INTRAMUSCULAR | Status: DC | PRN
  Administered 2016-05-09: 10:00:00 4 mg via INTRAVENOUS
  Filled 2016-05-09: qty 2

## 2016-05-09 MED ORDER — MIDAZOLAM HCL 2 MG/2ML IJ SOLN
2.0000 mg | INTRAMUSCULAR | Status: AC | PRN
  Administered 2016-05-09 (×3): 2 mg via INTRAVENOUS
  Filled 2016-05-09 (×4): qty 2

## 2016-05-09 MED ORDER — STERILE WATER FOR INJECTION IJ SOLN
INTRAMUSCULAR | Status: AC
  Administered 2016-05-09: 14:00:00
  Filled 2016-05-09: qty 10

## 2016-05-09 MED ORDER — INSULIN ASPART 100 UNIT/ML ~~LOC~~ SOLN
0.0000 [IU] | Freq: Three times a day (TID) | SUBCUTANEOUS | Status: DC
  Administered 2016-05-10: 2 [IU] via SUBCUTANEOUS
  Administered 2016-05-10 – 2016-05-11 (×2): 1 [IU] via SUBCUTANEOUS
  Filled 2016-05-09: qty 2
  Filled 2016-05-09 (×2): qty 1

## 2016-05-09 MED ORDER — INSULIN ASPART 100 UNIT/ML ~~LOC~~ SOLN: 0.0000 [IU] | Freq: Every day | SUBCUTANEOUS | Status: DC

## 2016-05-09 MED ORDER — FAMOTIDINE IN NACL 20-0.9 MG/50ML-% IV SOLN
20.0000 mg | Freq: Two times a day (BID) | INTRAVENOUS | Status: DC
Start: 2016-05-09 — End: 2016-05-11
  Administered 2016-05-09 – 2016-05-10 (×4): 20 mg via INTRAVENOUS
  Filled 2016-05-09 (×6): qty 50

## 2016-05-09 MED ORDER — FENTANYL BOLUS VIA INFUSION
50.0000 ug | INTRAVENOUS | Status: DC | PRN
  Administered 2016-05-09 – 2016-05-11 (×11): 50 ug via INTRAVENOUS
  Filled 2016-05-09: qty 50

## 2016-05-09 MED FILL — Medication: Qty: 1 | Status: AC

## 2016-05-09 NOTE — Significant Event (Signed)
Rapid Response Event Note  Overview: Time Called: 1220 Arrival Time: 1222 Event Type: Respiratory  Initial Focused Assessment: Patient lying in bed, not responding to sternal rub. RT at bedside,    Interventions: RT at bedside, immediately initiated 2l n/c, followed by BMV at 15 L. Patient apneic, Code Blue called at 1223. Dr. Don PerkingVeronese at bedside, intubated, with 20 mg Etomidate and 100 mg Succylcholine.  Dr. Hilton SinclairWeiting updated Dr. Allegra Granaamaschandran on patient status, Dr. Allegra Granaamaschandran at bedside.  Patient transferred to ICU 03 with this RN and RT.  Mother in hall, brought her to ICU and updated on patient status.  Plan of Care (if not transferred):  Event Summary: Name of Physician Notified: Weiting  at 1220  Name of Consulting Physician Notified: Ramaschandran at 1230  Outcome: Coded and survived, Transferred (Comment)     Celes Dedic

## 2016-05-09 NOTE — Progress Notes (Signed)
Initial Nutrition Assessment  DOCUMENTATION CODES:   Severe malnutrition in context of chronic illness  INTERVENTION:  -Once MD agreeable to restarting tube feeding recommend osmolite 1.5 bolus 6 cans per day (6am, 9am, 12pm, 3pm, 6pm, 9pm,) goal rate.  Will provide 2133 kcals, 89 g of protein and 1080ml free water. Recommend free water flush of 60ml before and after feeding  6 times per day for additional 720ml free water.  Additional tube feeding and water flush recommendations to follow pending tolerance. May want to restart tube feeding with only 3 cans per day, check labs and add additional cans pending labs and tolerance.  -Recommend checking Magnesium and Phosphorus as pt at risk of refeeding syndrome with weight loss and mother unsure if taking prescribed tube feeding.   NUTRITION DIAGNOSIS:   Malnutrition related to chronic illness as evidenced by severe depletion of body fat, severe depletion of muscle mass, percent weight loss.    GOAL:   Patient will meet greater than or equal to 90% of their needs    MONITOR:   TF tolerance, Weight trends  REASON FOR ASSESSMENT:   Malnutrition Screening Tool    ASSESSMENT:     Pt admitted with abdominal pain, shortnoess of breath, fever, nausea. Pt has PEG tube for feeding  Past Medical History:  Diagnosis Date  . ALS (amyotrophic lateral sclerosis) (HCC)   . Diabetes mellitus without complication (HCC)    Spoke with mother, Randy MarekShelia Black this am and reports pt is on osmolite 1.5 6-7 cans per day.  Pt lives with mother's sister as mother works full time.  Often times pt is home alone and does own tube feeding.  Mother feels pt may not be taking all of tube feeding prescribed.  Mother reports pt has been on this regimen for awhile. Reports typically has a regular bowel movement she thinks once per day.  Recently had a bout of constipation but relieved with MOM.    Medications reviewed: aspart, NS at 11925ml/hr Labs reviewed:  glucose 161  Nutrition-Focused physical exam completed. Findings are severe fat depletion, severe muscle depletion, and no edema.     Diet Order:  Diet NPO time specified  Skin:  Reviewed, no issues  Last BM:  PTA  Height:   Ht Readings from Last 1 Encounters:  05/08/16 5\' 7"  (1.702 m)    Weight: Mother reports wt of 114 pounds few months ago.  9% wt loss in 2 1/2 months)  Wt Readings from Last 1 Encounters:  05/08/16 104 lb 12.8 oz (47.5 kg)    Ideal Body Weight:     BMI:  Body mass index is 16.41 kg/m.  Estimated Nutritional Needs:   Kcal:  1440-1680 kcals/d  Protein:  58-72 g/d  Fluid:  > 16980ml/d  EDUCATION NEEDS:   No education needs identified at this time  Lido Maske B. Freida BusmanAllen, RD, LDN [phone] (pager) Weekend/On-Call pager [phone])

## 2016-05-09 NOTE — Care Management (Signed)
Ad[facility] with the diagnosis of sepsis/pneumonia. Lives with parents. Mother is Silvio PateShelia [phone]). Sees Dr. Donald Proseeborah Delinas at [facility] his [facility]. PEG tube in place. Followed by [facility]. Gwenette GreetBrenda S Caisen Mangas RN MSN CCM Care Management [phone]

## 2016-05-09 NOTE — ED Provider Notes (Signed)
[facility]  Department of Emergency Medicine   Code Blue CONSULT NOTE  Chief Complaint: Cardiac arrest/unresponsive   Level V Caveat: Unresponsive  History of present illness: I was contacted by the hospital for a CODE BLUE cardiac arrest upstairs and presented to the patient's bedside. 40 y.o. 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 Patient found to be apneic and code blue was called.   ROS: Unable to obtain, Level V caveat  Scheduled Meds: . amitriptyline  25 mg Oral QHS  . ceFEPime (MAXIPIME) IV  2 g Intravenous Q8H  . enoxaparin (LOVENOX) injection  40 mg Subcutaneous Q24H  . feeding supplement (OSMOLITE 1.5 CAL)  237 mL Per Tube TID  . [START ON 05/11/2016] fentaNYL  12.5 mcg Transdermal Q72H  . insulin aspart  0-5 Units Subcutaneous QHS  . insulin aspart  0-9 Units Subcutaneous TID WC  . riluzole  50 mg Per Tube Q12H  . vancomycin  750 mg Intravenous Q12H   Continuous Infusions: . sodium chloride 125 mL/hr at 05/09/16 0130   PRN Meds:.acetaminophen **OR** acetaminophen, HYDROcodone-acetaminophen, ipratropium-albuterol, ondansetron **OR** ondansetron (ZOFRAN) IV Past Medical History:  Diagnosis Date  . ALS (amyotrophic lateral sclerosis) (HCC)   . Diabetes mellitus without complication [facility]HCC)    Past Surgical History:  Procedure Laterality Date  . APPENDECTOMY    . PEG TUBE PLACEMENT Left    Social History   Social History  . Marital status: Divorced    Spouse name: N/A  . Number of children: N/A  . Years of education: N/A   Occupational History  . Not on file.   Social History Main Topics  . Smoking status: Current Every Day Smoker    Packs/day: 1.00    Types: Cigarettes  . Smokeless tobacco: Never Used  . Alcohol use No     Comment: occ  . Drug use: No  . Sexual activity: Not on file   Other Topics Concern  . Not on file   Social History Narrative  . No narrative on file   No  Known Allergies  Last set of Vital Signs (not current) Vitals:   05/08/16 2330 05/09/16 0451  BP: 124/83 123/75  Pulse: (!) 145 (!) 133  Resp: (!) 22 17  Temp: 99.3 F (37.4 C) 99.1 F (37.3 C)      Physical Exam Gen: unresponsive Cardiovascular: RRR Resp: apneic. Breath sounds equal bilaterally with bagging  Abd: nondistended  Neuro: GCS 3, unresponsive to pain  HEENT: No blood in posterior pharynx, gag reflex absent  Neck: No crepitus  Musculoskeletal: No deformity  Skin: warm  Procedures  INTUBATION Performed by: Nita Sicklearolina Roux Brandy Required items: required blood products, implants, devices, and special equipment available Patient identity confirmed: provided demographic data and hospital-assigned identification number Time out: Immediately prior to procedure a "time out" was called to verify the correct patient, procedure, equipment, support staff and site/side marked as required. Indications: respiratory arrest Intubation method: DL Preoxygenation: BVM Sedatives: etomidate Paralytic: Succinylcholine  Tube Size: 7.5 cuffed Post-procedure assessment: chest rise and ETCO2 monitor Breath sounds: equal and absent over the epigastrium Tube secured by Respiratory Therapy Patient tolerated the procedure well with no immediate complications.  CRITICAL CARE Performed by: Nita Sicklearolina Aurthur Wingerter Total critical care time: 35 min Critical care time was exclusive of separately billable procedures and treating other patients. Critical care was necessary to treat or prevent imminent or life-threatening deterioration. Critical care was time spent personally  by me on the following activities: development of treatment plan with patient and/or surrogate as well as nursing, discussions with consultants, evaluation of patient's response to treatment, examination of patient, obtaining history from patient or surrogate, ordering and performing treatments and interventions, ordering and review of  laboratory studies, ordering and review of radiographic studies, pulse oximetry and re-evaluation of patient's condition.  Cardiopulmonary Resuscitation (CPR) Procedure Note Patient with respiratory arrest but had pulse so no CPR performed.  Medical Decision making  73M with history of a last admitted for sepsis due to community-acquired pneumonia for home a CODE BLUE was called due to apnea. Patient with pulse but not breathing spontaneously. Patient was being bagged upon my arrival with sats at 100%. Patient was placed on a nasal cannula 15 L and BVM at 15 L for preintubation oxygenation. Patient was given etomidate and succinylcholine. Patient was intubated with one attempt using direct laryngoscopy. Patient with thick foul-smelling yellow secretions in his airway which was suctioned. Patient was then connected to the ventilator in stable but critical condition. Care was transferred to the Hospitalist Dr. Renae Gloss.    Nita Sickle, MD 05/09/16 1250

## 2016-05-09 NOTE — H&P (Signed)
PULMONARY / CRITICAL CARE MEDICINE   Name: Randy Roach MRN: [id number] DOB: 1975/12/10    ADMISSION DATE:  05/08/2016 CONSULTATION DATE:  05/09/2016  REFERRING MD:  Dr. Renae Gloss  CHIEF COMPLAINT:  Abdominal pain, shortness of breath, and nausea  HISTORY OF PRESENT ILLNESS:   This is a 40 yo male with a PMH of Diabetes Mellitus and ALS.  He presented to [facility] ER on 8/7 with c/o shortness of breath, cough, nausea and fever.  The cough was productive with foul smelling greenish sputum.  The pt is followed by hospice once symptoms developed the family notified hospice, and they recommended the pt should be brought to the emergency department for further evaluation.  At his baseline he is nonverbal and unable to lift his head, however he is cognitively intact and uses his phone to text to communicate.  On 8/8 he became apneic and a code blue was called once the code team arrived at bedside the pt required emergent intubation by the ER physician CPR was not performed the pt never lost his pulse.  PCCM consulted on 8/8 for management of acute hypoxic respiratory failure secondary to pneumonia requiring mechanical intubation.  PAST MEDICAL HISTORY :  He  has a past medical history of ALS (amyotrophic lateral sclerosis) (HCC) and Diabetes mellitus without complication (HCC).  PAST SURGICAL HISTORY: He  has a past surgical history that includes Appendectomy and PEG tube placement (Left).  No Known Allergies  No current facility-administered medications on file prior to encounter.    No current outpatient prescriptions on file prior to encounter.    FAMILY HISTORY:  His has no family status information on file.    SOCIAL HISTORY: He  reports that he has been smoking Cigarettes.  He has been smoking about 1.00 pack per day. He has never used smokeless tobacco. He reports that he does not drink alcohol or use drugs.  REVIEW OF SYSTEMS:   Unable to assess pt intubated  SUBJECTIVE:  The pt is  mechanically intubated and sedated  VITAL SIGNS: BP 123/75 (BP Location: Right Arm)   Pulse (!) 133   Temp 99.1 F (37.3 C) (Oral)   Resp 17   Ht  (1.702 m)   Wt 104 lb 12.8 oz (47.5 kg)   SpO2 95%   BMI 16.41 kg/m   HEMODYNAMICS:    VENTILATOR SETTINGS:    INTAKE / OUTPUT: I/O last 3 completed shifts: In: 1950 [IV Piggyback:1950] Out: -   PHYSICAL EXAMINATION: General:  Acutely ill appearing, cachetic male Neuro:  Bilateral pupils 2 mm sluggish, follows commands,  HEENT:  Supple, no JVD Cardiovascular:  Sinus tach, s1s2, no M/R/G Lungs:  Course throughout, even, non labored, intubated Abdomen:  +BS x4, soft, non tender, non distended Musculoskeletal:  cachetic Skin:  Intact, no rashes or lesions  LABS:  BMET  Recent Labs Lab 05/08/16 2029  NA 135  K 4.0  CL 89*  CO2 36*  BUN 14  CREATININE 0.39*  GLUCOSE 161*    Electrolytes  Recent Labs Lab 05/08/16 2029  CALCIUM 9.7    CBC  Recent Labs Lab 05/08/16 2029  WBC 15.7*  HGB 14.0  HCT 42.0  PLT 232    Coag's No results for input(s): APTT, INR in the last 168 hours.  Sepsis Markers  Recent Labs Lab 05/08/16 2029 05/09/16 0132  LATICACIDVEN 0.8 0.9    ABG No results for input(s): PHART, PCO2ART, PO2ART in the last 168 hours.  Liver Enzymes  Recent Labs Lab 05/08/16 2029  AST 15  ALT 17  ALKPHOS 68  BILITOT 0.8  ALBUMIN 4.0    Cardiac Enzymes  Recent Labs Lab 05/08/16 2029  TROPONINI <0.03    Glucose  Recent Labs Lab 05/09/16 0940 05/09/16 1128  GLUCAP 154* 192*    Imaging Dg Chest Port 1 View  Result Date: 05/08/2016 CLINICAL DATA:  Productive cough. EXAM: PORTABLE CHEST 1 VIEW COMPARISON:  Radiographs of Mar 01, 2016. FINDINGS: The heart size and mediastinal contours are within normal limits. Interval development of bibasilar opacities, left greater than right, concerning for pneumonia. No pneumothorax or pleural effusion is noted. The visualized  skeletal structures are unremarkable. IMPRESSION: Interval development of bibasilar opacities concerning for pneumonia, left greater than right. Electronically Signed   By: Lupita RaiderJames  Green Jr, M.D.   On: 05/08/2016 20:18    STUDIES:  None  CULTURES: Blood x2 8/7>> Urine>> Sputum>>  ANTIBIOTICS: Cefepime 8/8>> Vancomycin 8/8>>  SIGNIFICANT EVENTS: 8/7-[facility] of [facility]  8/8-Pt respiratory arrested code blue called required emergent intubation PCCM consulted  LINES/TUBES: ETT 8/8>> Right femoral central line 8/8>> Left IJ central line 8/8>>8/8 Gastrostomy tube 11/26/15  ASSESSMENT / PLAN:  PULMONARY A: Acute hypoxic respiratory failure secondary to Pneumonia  Mechanical Intubation P:   CXR and ABG today 8/8 Full vent support for now wean as tolerated  VAP bundle  Continue prn Duonebs May require bronchoscopy   CARDIOVASCULAR A:  Sinus tachycardia P:  Continuous telemetry monitoring   RENAL A:   No acute issues P:   Trend BMP's Replace electrolytes as indicated  Foley catheter for strict output  GASTROINTESTINAL A:   No acute issues P:   Continue tube feedings Pepcid for GI prophylaxis   HEMATOLOGIC A:   No acute issues P:  Lovenox for VTE prophylaxis Monitor for s/sx of bleeding Transfuse for Hgb <7  INFECTIOUS A:   Leukocytosis P:   Continue abx as above Trend WBC and monitor fever curve Follow cultures Trend PCT's  ENDOCRINE A:   Diabetes Mellitus P:   CBG's q4hrs SSI  NEUROLOGIC A:   Hx: ALS P:   RASS goal: 0 to -1 Fentanly gtt to maintain RASS goal Prn Fentanyl for pain management Prn versed to maintain RASS goal  WUA daily Hold outpatient fentanyl patch and norco   FAMILY  - Updates: Pts mother updated about plan of care and questions answered 05/09/16  - Inter-disciplinary family meet or Palliative Care meeting due by:  05/16/16   Sonda Rumbleana Kisean Rollo, Juel BurrowAGNP  Pulmonary/Critical Care Pager  [phone] (please enter 7 digits) PCCM Consult Pager [phone] (please enter 7 digits)

## 2016-05-09 NOTE — Progress Notes (Signed)
Patient is currently followed at home by [facility] with a hospice diagnosis of ALS. He is a FULL CODE. [facility]. Patient seen lying in bed, appeared to have no respirations, carotid pulse visible. Staff RN Trey PaulaJeff notified, attending physician Dr. Renae GlossWieting present on the unit also came into the room. Code blue was called. Dr. Hilton SinclairWeiting spoke on the phone to patient's mother, [name], who confirmed his FULL code status, intubation was requested. Code team present, patient intubated and transferred to the ICU. Patient's mother on her way to the hospital. Will update hospice team. [name] RN, BSN, [facility], hospital Liaison [phone] c

## 2016-05-09 NOTE — Plan of Care (Signed)
Problem: Physical Regulation: Goal: Ability to maintain clinical measurements within normal limits will improve Outcome: Not Progressing Elevated HR, MD aware  Problem: Fluid Volume: Goal: Ability to maintain a balanced intake and output will improve Outcome: Not Progressing npo  Problem: Nutrition: Goal: Adequate nutrition will be maintained Outcome: Not Progressing npo

## 2016-05-09 NOTE — H&P (Signed)
[facility] - [place] [facility] Admission History and Physical [employer] Lamaj Metoyer, D.O.  ---------------------------------------------------------------------------------------------------------------------   PATIENT NAME: Randy Roach MR#: [id number] DATE OF BIRTH: 05/28/1976 DATE OF ADMISSION: 05/08/2016 PRIMARY CARE PHYSICIAN: No PCP Per Patient  REQUESTING/REFERRING PHYSICIAN: ED Dr. Pershing ProudSchaevitz  CHIEF COMPLAINT: Chief Complaint  Patient presents with  . Abdominal Pain  . Shortness of Breath  . Nausea    HISTORY OF PRESENT ILLNESS: Randy Roach is a 40 y.o. 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  Cough was productive of foul smelling green sputum.    The family called hospice and the hospice recommended that he come to the emergency department for further evaluation. At his baseline he is nonverbal and unable to lift his head. However he has cognitively intact and texts on his phone to communicate. He is denying any fevers/chills, shortness of breath, abdominal pain or chest pain at this time.  Otherwise there has been no change in status. He has not been hospitalized in the past 6 months.  Patient has been taking medication as prescribed and there has been no recent change in medication or diet.  There has been no recent illness, travel or sick contacts.    Patient denies fevers/chills, weakness, dizziness, chest pain, shortness of breath, N/V/C/D, abdominal pain, dysuria/frequency, changes in mental status.    PAST MEDICAL HISTORY: Past Medical History:  Diagnosis Date  . ALS (amyotrophic lateral sclerosis) (HCC)   . Diabetes mellitus without complication (HCC)     PAST SURGICAL HISTORY: Past Surgical History:  Procedure Laterality Date  . APPENDECTOMY    . PEG TUBE PLACEMENT Left      SOCIAL HISTORY: Social History  Substance Use  Topics  . Smoking status: Current Every Day Smoker    Packs/day: 1.00    Types: Cigarettes  . Smokeless tobacco: Never Used  . Alcohol use No     Comment: occ    FAMILY HISTORY: History reviewed. No pertinent family history.   MEDICATIONS AT HOME: Prior to Admission medications   Medication Sig Start Date End Date Taking? Authorizing Provider  Amitriptyline HCl (ELAVIL PO) Take 25 mg by mouth at bedtime. Patient is taking a suspension. 12/24/15  Yes Historical Provider, MD  fentaNYL (DURAGESIC - DOSED MCG/HR) 12 MCG/HR Place 12.5 mcg onto the skin every 3 (three) days.   Yes Historical Provider, MD  HYDROcodone-acetaminophen (NORCO) 10-325 MG tablet Take 1 tablet by mouth every 4 (four) hours as needed.   Yes Historical Provider, MD  predniSONE (STERAPRED UNI-PAK 21 TAB) 10 MG (21) TBPK tablet Take 10 mg by mouth daily. day 1 - 2 before breakfast, 1 after lunch, 1 after supper, and 2 at bedtime, day 2 - 1 before breakfast, 1 after lunch, 1 after supper, and 2 at bedtime, day 3 - 1 before breakfast, 1 after lunch, 1 after supper, and 1 at bedtime, day 4 - 1 before breakfast, 1 after lunch, and 1 at bedtime, day 5 - 1 before breakfast, and 1 at bedtime, day 6 - 1 before breakfast   Yes Historical Provider, MD  riluzole (RILUTEK) 50 MG tablet Take 50 mg by mouth every 12 (twelve) hours.   Yes Historical Provider, MD      DRUG ALLERGIES: No Known Allergies   REVIEW OF SYSTEMS: CONSTITUTIONAL: No fever/chills. No fatigue, weakness. No weight gain, no weight  loss. EYES: No blurry or double vision. ENT: No tinnitus. No postnasal drip. No redness or soreness of the oropharynx. RESPIRATORY: (+) cough, no wheeze, no hemoptysis. (+)dyspnea. CARDIOVASCULAR: No chest pain. No orthopnea. No palpitations. No syncope. GASTROINTESTINAL: (+) nausea, no vomiting or diarrhea. No abdominal pain. No melena or hematochezia. GENITOURINARY: No dysuria or hematuria. ENDOCRINE: No polyuria or nocturia.  No heat or cold intolerance. HEMATOLOGY: No anemia. No bruising. No bleeding. INTEGUMENTARY: No rashes. No lesions. MUSCULOSKELETAL: No arthritis. No swelling. No gout. NEUROLOGIC: No numbness, tingling, weakness or ataxia. No seizure-type activity. PSYCHIATRIC: No anxiety. No depression. No insomnia.  PHYSICAL EXAMINATION: VITAL SIGNS: Blood pressure 124/83, pulse (!) 145, temperature 99.3 F (37.4 C), temperature source Oral, resp. rate (!) 22, height  (1.702 m), weight 47.5 kg (104 lb 12.8 oz), SpO2 95 %.  GENERAL: 40 y.o.-year-old white male patient, thin, well-nourished lying in the bed in no acute distress. EYES: Pupils equal, round, reactive to light and accommodation. No scleral icterus. Extraocular muscles intact. HEENT: Head atraumatic, normocephalic. Oropharynx and nasopharynx clear. Mucus membranes moist. NECK: Supple, full range of motion. No JVD, no bruit heard. No thyroid enlargement, no tenderness, no lymphadenopathy. CHEST: Normal breath sounds bilaterally with diminished effort, no wheezing, rales, rhonchi or crepitation. No use of accessory muscles of respiration.  No reproducible chest wall tenderness.  CARDIOVASCULAR: S1, S2 normal. No murmurs, rubs, or gallops. Cap refill <2 seconds. ABDOMEN: Soft, nontender, nondistended. No rebound, guarding, rigidity. Normoactive bowel sounds present in all four quadrants. No organomegaly or mass.PEG in place.  EXTREMITIES: No pedal edema, cyanosis, or clubbing. NEUROLOGIC: Mostly nonverbal but cognitively intact. Follows commands. Severe diffuse muscle atrophy most significant in the C-spine.   PSYCHIATRIC: The patient is alert and oriented x 3. Normal affect, mood, thought content. SKIN: Warm, dry, and intact without obvious rash, lesion, or ulcer.  LABORATORY PANEL:  CBC  Recent Labs Lab 05/08/16 2029  WBC 15.7*  HGB 14.0  HCT 42.0  PLT 232    ----------------------------------------------------------------------------------------------------------------- Chemistries  Recent Labs Lab 05/08/16 2029  NA 135  K 4.0  CL 89*  CO2 36*  GLUCOSE 161*  BUN 14  CREATININE 0.39*  CALCIUM 9.7  AST 15  ALT 17  ALKPHOS 68  BILITOT 0.8   ------------------------------------------------------------------------------------------------------------------ Cardiac Enzymes  Recent Labs Lab 05/08/16 2029  TROPONINI <0.03   ------------------------------------------------------------------------------------------------------------------  RADIOLOGY: Dg Chest Port 1 View  Result Date: 05/08/2016 CLINICAL DATA:  Productive cough. EXAM: PORTABLE CHEST 1 VIEW COMPARISON:  Radiographs of Mar 01, 2016. FINDINGS: The heart size and mediastinal contours are within normal limits. Interval development of bibasilar opacities, left greater than right, concerning for pneumonia. No pneumothorax or pleural effusion is noted. The visualized skeletal structures are unremarkable. IMPRESSION: Interval development of bibasilar opacities concerning for pneumonia, left greater than right. Electronically Signed   By: Lupita Raider, M.D.   On: 05/08/2016 20:18    EKG: Sinus tach @ 153bpm with RAD, no significant ST-T wave changes.    IMPRESSION AND PLAN:  This is a 40 y.o. male with a history of ALS on hospice, DM now being admitted with: 1. Sepsis secondary to community acquired pneumonia Admit for IVFs, IV antibiotics - started on broad spectrum Maxipime and Vanco in ED, sputum and blood cultures, trend labs.    Fluids: IVNS Diet/Nutrition: Peg tube feeds 7x daily  DVT Px: Lovenox SCDs  All the records are reviewed and case discussed with ED provider. Management plans discussed with the patient and/or  family who express understanding and agree with plan of care.  CODE STATUS: Full at present - will need to discuss plan with patient, family and  hospice.   TOTAL TIME TAKING CARE OF THIS PATIENT: 50 minutes.   Lavita Pontius D.O. on 05/09/2016 at 1:47 AM Between 7am to 6pm - Pager - [phone] After 6pm go to [email or web address] - [employer] [facility] Office [phone] CC: Primary care physician; No PCP Per Patient     Note: This dictation was prepared with Dragon dictation along with smaller phrase technology. Any transcriptional errors that result from this process are unintentional.

## 2016-05-09 NOTE — Progress Notes (Signed)
[employer] [facility] PROGRESS NOTE  Randy Roach ZOX:[id number] DOB: July 09, 1976 DOA: 05/08/2016 PCP: No PCP Per Patient  HPI/Subjective: I was on the floor and patient not breathing right. I walked in the room and he is not breathing and pulse ox in the 60's, had a pulse. Spoke with mother about intubation.   Objective: Vitals:   05/08/16 2330 05/09/16 0451  BP: 124/83 123/75  Pulse: (!) 145 (!) 133  Resp: (!) 22 17  Temp: 99.3 F (37.4 C) 99.1 F (37.3 C)    Filed Weights   05/08/16 2000  Weight: 47.5 kg (104 lb 12.8 oz)    ROS: Review of Systems  Unable to perform ROS: Acuity of condition   Exam: Physical Exam  Constitutional: He is intubated.  HENT:  Mouth/Throat: No oropharyngeal exudate.  Eyes: Conjunctivae are normal. Pupils are equal, round, and reactive to light.  Neck: Carotid bruit is not present. No thyromegaly present.  Cardiovascular: Regular rhythm, S1 normal and S2 normal.  Tachycardia present.   Murmur heard.  Systolic murmur is present with a grade of 2/6  Respiratory: Apnea noted. He is intubated. He has decreased breath sounds in the right upper field, the right middle field, the right lower field, the left upper field, the left middle field and the left lower field. He has rhonchi in the right lower field and the left lower field. He has no rales.  GI: Soft. There is no tenderness.  Musculoskeletal:       Right ankle: He exhibits no swelling.       Left ankle: He exhibits no swelling.  Neurological: He is unresponsive.  Skin: He is diaphoretic.  Psychiatric:  Unresponsive      Data Reviewed: Basic Metabolic Panel:  Recent Labs Lab 05/08/16 2029  NA 135  K 4.0  CL 89*  CO2 36*  GLUCOSE 161*  BUN 14  CREATININE 0.39*  CALCIUM 9.7   Liver Function Tests:  Recent Labs Lab 05/08/16 2029  AST 15  ALT 17  ALKPHOS 68  BILITOT 0.8  PROT 7.5  ALBUMIN 4.0   CBC:  Recent Labs Lab 05/08/16 2029  WBC 15.7*  NEUTROABS 13.8*  HGB  14.0  HCT 42.0  MCV 89.2  PLT 232   Cardiac Enzymes:  Recent Labs Lab 05/08/16 2029  TROPONINI <0.03     CBG:  Recent Labs Lab 05/09/16 0940 05/09/16 1128  GLUCAP 154* 192*      Studies: Dg Chest Port 1 View  Result Date: 05/08/2016 CLINICAL DATA:  Productive cough. EXAM: PORTABLE CHEST 1 VIEW COMPARISON:  Radiographs of Mar 01, 2016. FINDINGS: The heart size and mediastinal contours are within normal limits. Interval development of bibasilar opacities, left greater than right, concerning for pneumonia. No pneumothorax or pleural effusion is noted. The visualized skeletal structures are unremarkable. IMPRESSION: Interval development of bibasilar opacities concerning for pneumonia, left greater than right. Electronically Signed   By: Lupita Raider, M.D.   On: 05/08/2016 20:18    Scheduled Meds: . amitriptyline  25 mg Oral QHS  . ceFEPime (MAXIPIME) IV  2 g Intravenous Q8H  . enoxaparin (LOVENOX) injection  40 mg Subcutaneous Q24H  . feeding supplement (OSMOLITE 1.5 CAL)  237 mL Per Tube TID  . [START ON 05/11/2016] fentaNYL  12.5 mcg Transdermal Q72H  . insulin aspart  0-5 Units Subcutaneous QHS  . insulin aspart  0-9 Units Subcutaneous TID WC  . riluzole  50 mg Per Tube Q12H  . vancomycin  750 mg Intravenous Q12H   Continuous Infusions: . sodium chloride 125 mL/hr at 05/09/16 0130    Assessment/Plan:  1. Acute respiratory arrest with hypoxia pulse ox in the 60s. Patient not breathing. Spoke with patient's mother and the code at this point. Patient was intubated by ER physician. Etomidate and succinylcholine given. Case discussed with intensivist on call and they will take over care since now he is on the ventilator. Post intubation chest x-ray ordered. 2. Clinical sepsis. Continue antibiotics 3. Pneumonia left greater than right. Continue antibiotics and consider bronchoscopy 4. Possible aspiration. 5. Type 2 diabetes Sliding scale insulin ordered 6. History of  ALS. Patient may have trouble coming off the ventilator. 7. Overall prognosis is poor. High risk for cardiopulmonary arrest. Hospice following as outpatient but patient is full code at this time.  Code Status:     Code Status Orders        Start     Ordered   05/09/16 0020  Full code  Continuous     05/09/16 0019    Code Status History    Date Active Date Inactive Code Status Order ID Comments User Context   11/26/2015  3:15 PM 11/27/2015  3:32 AM Full Code [id number]  Gilmer MorJaime Wagner, DO HOV     Family Communication: Spoke with mother on the phone Disposition Plan: Transfer to ICU service and ICU care  Consultants:  Critical care specialist  Antibiotics:  Vancomycin  Cefepime  Time spent: 35 minutes critical care time. Case discussed with mother, ER physician, intensivist.  Alford HighlandWIETING, Jazelle Achey  [facility]

## 2016-05-09 NOTE — Procedures (Signed)
Central Venous Catheter Insertion Procedure Note Randy Roach [id number] 02/01/1976  Procedure: Insertion of Central Venous Catheter Indications: Assessment of intravascular volume, Drug and/or fluid administration and Frequent blood sampling  Procedure Details Consent: Risks of procedure as well as the alternatives and risks of each were explained to the (patient/caregiver).  Consent for procedure obtained. Time Out: Verified patient identification, verified procedure, site/side was marked, verified correct patient position, special equipment/implants available, medications/allergies/relevent history reviewed, required imaging and test results available.  Performed  Maximum sterile technique was used including antiseptics, cap, gloves, gown, hand hygiene, mask and sheet. Skin prep: Chlorhexidine; local anesthetic administered A antimicrobial bonded/coated triple lumen catheter was placed in the right femoral vein due to multiple attempts, no other available access using the Seldinger technique.  Evaluation Blood flow good Complications: No apparent complications Patient did tolerate procedure well. Chest X-ray ordered to verify placement.  CXR: not indicated.  Randy Roach 05/09/2016, 3:57 PM Right femoral line place under direct supervision by Dr. Nicholos Johnsamachandran with utilization of ultrasound

## 2016-05-09 NOTE — Progress Notes (Signed)
Inpatient Diabetes Program Recommendations  AACE/ADA: New Consensus Statement on Inpatient Glycemic Control (2015)  Target Ranges:  Prepandial:   less than 140 mg/dL      Peak postprandial:   less than 180 mg/dL (1-2 hours)      Critically ill patients:  140 - 180 mg/dL   Results for [name], Randy D (MRN [id number]) as of 05/09/2016 14:20  Ref. Range 05/09/2016 09:40 05/09/2016 11:28 05/09/2016 12:29  Glucose-Capillary Latest Ref Range: 65 - 99 mg/dL 469154 (H) 629192 (H) 528209 (H)    Admit with: Sepsis/ Pneumonia  History: DM, ALS  Home DM Meds: None  Current Insulin Orders: Novolog Sensitive Correction Scale/ SSI (0-9 units) TID AC + HS      MD- Note patient Apneic on floor.  Code Blue called and patient Intubated and transferred to ICU.  If aggressive glucose management appropriate, please discontinue Novolog Sensitive Correction Scale/ SSI (0-9 units) TID AC + HS and Start ICU Glycemic Control Protocol.  OR  Please change Novolog Sensitive Correction Scale/ SSI (0-9 units) to Q4 hour coverage while patient NPO.       --Will follow patient during hospitalization--  Ambrose FinlandJeannine Johnston Tylyn Stankovich RN, MSN, CDE Diabetes Coordinator Inpatient Glycemic Control Team Team Pager: [phone] (8a-5p)

## 2016-05-09 NOTE — Progress Notes (Signed)
Pharmacy Antibiotic Note  Randy Roach is a 40 y.o. male admitted on 05/08/2016 with pneumonia.  Pharmacy has been consulted for vancomycin and cefepime dosing.  Plan: DW 47.5kg  Vd 33L kei 0.075 hr-1  T1/2 9 hours Vancomycin 750 mg q 12 hours ordered with stacked dosing. Level before 5th dose. Goal trough 15-20.    Cefepime 2 grams q 8 hours ordered.   Height: 5\' 7"  (170.2 cm) Weight: 104 lb 12.8 oz (47.5 kg) IBW/kg (Calculated) : 66.1  Temp (24hrs), Avg:99.9 F (37.7 C), Min:99.3 F (37.4 C), Max:101 F (38.3 C)   Recent Labs Lab 05/08/16 2029  WBC 15.7*  CREATININE 0.39*  LATICACIDVEN 0.8    Estimated Creatinine Clearance: 84.9 mL/min (by C-G formula based on SCr of 0.8 mg/dL).    No Known Allergies  Antimicrobials this admission: vancomycin  >>  cefepime  >>   Dose adjustments this admission:   Microbiology results: 8/7 BCx: pending 8/7 UCx: pending  8/8 Sputum: pending    8/7 CXR: bibasilar opacities L>R 8/7 UA: pending  Thank you for allowing pharmacy to be a part of this patient's care.  [name] 05/09/2016 1:48 AM

## 2016-05-10 ENCOUNTER — Inpatient Hospital Stay

## 2016-05-10 DIAGNOSIS — J9601 Acute respiratory failure with hypoxia: Secondary | ICD-10-CM

## 2016-05-10 LAB — BLOOD GAS, ARTERIAL
ACID-BASE EXCESS: 12.5 mmol/L — AB (ref 0.0–3.0)
Bicarbonate: 36.7 mEq/L — ABNORMAL HIGH (ref 21.0–28.0)
FIO2: 0.35
MECHANICAL RATE: 15
O2 Saturation: 98.4 %
PEEP/CPAP: 5 cmH2O
PH ART: 7.52 — AB (ref 7.350–7.450)
Patient temperature: 37
VT: 500 mL
pCO2 arterial: 45 mmHg (ref 32.0–48.0)
pO2, Arterial: 101 mmHg (ref 83.0–108.0)

## 2016-05-10 LAB — CBC WITH DIFFERENTIAL/PLATELET
Basophils Absolute: 0 10*3/uL (ref 0–0.1)
Basophils Relative: 0 %
EOS PCT: 0 %
Eosinophils Absolute: 0 10*3/uL (ref 0–0.7)
HEMATOCRIT: 31.5 % — AB (ref 40.0–52.0)
Hemoglobin: 10.8 g/dL — ABNORMAL LOW (ref 13.0–18.0)
LYMPHS ABS: 1.8 10*3/uL (ref 1.0–3.6)
LYMPHS PCT: 20 %
MCH: 30.3 pg (ref 26.0–34.0)
MCHC: 34.4 g/dL (ref 32.0–36.0)
MCV: 87.9 fL (ref 80.0–100.0)
MONO ABS: 1 10*3/uL (ref 0.2–1.0)
MONOS PCT: 12 %
NEUTROS ABS: 6.1 10*3/uL (ref 1.4–6.5)
Neutrophils Relative %: 68 %
Platelets: 219 10*3/uL (ref 150–440)
RBC: 3.58 MIL/uL — ABNORMAL LOW (ref 4.40–5.90)
RDW: 12.8 % (ref 11.5–14.5)
WBC: 9 10*3/uL (ref 3.8–10.6)

## 2016-05-10 LAB — MAGNESIUM
Magnesium: 1.5 mg/dL — ABNORMAL LOW (ref 1.7–2.4)
Magnesium: 2 mg/dL (ref 1.7–2.4)

## 2016-05-10 LAB — PHOSPHORUS: PHOSPHORUS: 2.1 mg/dL — AB (ref 2.5–4.6)

## 2016-05-10 LAB — GLUCOSE, CAPILLARY
GLUCOSE-CAPILLARY: 75 mg/dL (ref 65–99)
GLUCOSE-CAPILLARY: 153 mg/dL — AB (ref 65–99)
GLUCOSE-CAPILLARY: 119 mg/dL — AB (ref 65–99)
Glucose-Capillary: 92 mg/dL (ref 65–99)
Glucose-Capillary: 136 mg/dL — ABNORMAL HIGH (ref 65–99)

## 2016-05-10 LAB — BASIC METABOLIC PANEL
ANION GAP: 7 (ref 5–15)
Anion gap: 3 — ABNORMAL LOW (ref 5–15)
BUN: 11 mg/dL (ref 6–20)
BUN: 8 mg/dL (ref 6–20)
CALCIUM: 8.1 mg/dL — AB (ref 8.9–10.3)
CO2: 32 mmol/L (ref 22–32)
CO2: 32 mmol/L (ref 22–32)
CREATININE: 0.41 mg/dL — AB (ref 0.61–1.24)
CREATININE: 0.36 mg/dL — AB (ref 0.61–1.24)
Calcium: 7.6 mg/dL — ABNORMAL LOW (ref 8.9–10.3)
Chloride: 96 mmol/L — ABNORMAL LOW (ref 101–111)
Chloride: 97 mmol/L — ABNORMAL LOW (ref 101–111)
GFR calc Af Amer: >60 mL/min (ref >60)
GFR calc Af Amer: >60 mL/min (ref >60)
GFR calc non Af Amer: >60 mL/min (ref >60)
GLUCOSE: 138 mg/dL — AB (ref 65–99)
Glucose, Bld: 89 mg/dL (ref 65–99)
Potassium: 3.1 mmol/L — ABNORMAL LOW (ref 3.5–5.1)
Potassium: 3.4 mmol/L — ABNORMAL LOW (ref 3.5–5.1)
SODIUM: 132 mmol/L — AB (ref 135–145)
Sodium: 135 mmol/L (ref 135–145)

## 2016-05-10 LAB — CREATININE, SERUM: CREATININE: 0.33 mg/dL — AB (ref 0.61–1.24)

## 2016-05-10 LAB — URINE CULTURE: Culture: NO GROWTH

## 2016-05-10 MED ORDER — POTASSIUM PHOSPHATES 15 MMOLE/5ML IV SOLN
10.0000 mmol | Freq: Once | INTRAVENOUS | Status: AC
  Administered 2016-05-10: 10 mmol via INTRAVENOUS
  Filled 2016-05-10: qty 3.33

## 2016-05-10 MED ORDER — POTASSIUM CHLORIDE 20 MEQ PO PACK
20.0000 meq | PACK | Freq: Once | ORAL | Status: AC
  Administered 2016-05-10: 20 meq via ORAL
  Filled 2016-05-10: qty 1

## 2016-05-10 MED ORDER — ENOXAPARIN SODIUM 30 MG/0.3ML ~~LOC~~ SOLN
30.0000 mg | SUBCUTANEOUS | Status: DC
  Administered 2016-05-10: 30 mg via SUBCUTANEOUS
  Filled 2016-05-10: qty 0.3

## 2016-05-10 MED ORDER — FREE WATER
30.0000 mL | Status: DC
  Administered 2016-05-10 – 2016-05-11 (×7): 30 mL

## 2016-05-10 MED ORDER — MAGNESIUM SULFATE 4 GM/100ML IV SOLN
4.0000 g | Freq: Once | INTRAVENOUS | Status: AC
  Administered 2016-05-10: 4 g via INTRAVENOUS
  Filled 2016-05-10: qty 100

## 2016-05-10 MED ORDER — PIPERACILLIN-TAZOBACTAM 3.375 G IVPB
3.3750 g | Freq: Three times a day (TID) | INTRAVENOUS | Status: DC
  Administered 2016-05-10 – 2016-05-11 (×3): 3.375 g via INTRAVENOUS
  Filled 2016-05-10 (×5): qty 50

## 2016-05-10 MED ORDER — POTASSIUM CHLORIDE 20 MEQ PO PACK
40.0000 meq | PACK | Freq: Once | ORAL | Status: AC
  Administered 2016-05-10: 40 meq
  Filled 2016-05-10: qty 2

## 2016-05-10 MED ORDER — OSMOLITE 1.2 CAL PO LIQD
1000.0000 mL | ORAL | Status: DC
  Administered 2016-05-10 (×2): 1000 mL

## 2016-05-10 MED ORDER — POTASSIUM PHOSPHATES 15 MMOLE/5ML IV SOLN
30.0000 mmol | Freq: Once | INTRAVENOUS | Status: AC
  Administered 2016-05-10: 30 mmol via INTRAVENOUS
  Filled 2016-05-10: qty 10

## 2016-05-10 MED ORDER — PRO-STAT SUGAR FREE PO LIQD
30.0000 mL | Freq: Every day | ORAL | Status: DC
  Administered 2016-05-10 – 2016-05-11 (×2): 30 mL

## 2016-05-10 NOTE — Progress Notes (Signed)
Enoxaparin   Patient qualifies for Enoxaparin 30 mg SQ daily due to low body weight . Will change to Enoxaparin 30 mg SQ daily.  Demetrius Charityeldrin D. Norvil Martensen, PharmD

## 2016-05-10 NOTE — Progress Notes (Signed)
Patient ID: Randy Roach, male   DOB: 08/26/1976, 40 y.o.   MRN: [id number]   [employer] Physician Progress Note and Electrolyte Replacement  Patient Name: Randy Roach DOB: 08/27/1976 MRN: [id number]  Date of Service  05/10/2016   HPI/Events of Note    Recent Labs Lab 05/08/16 2029 05/09/16 1831 05/10/16 0423  NA 135 138 135  K 4.0 3.4* 3.1*  CL 89* 94* 96*  CO2 36* 38* 32  GLUCOSE 161* 101* 138*  BUN 14 10 11   CREATININE 0.39* 0.38* 0.41*  CALCIUM 9.7 8.8* 8.1*  MG  --   --  1.5*  PHOS  --   --  <1.0*    Estimated Creatinine Clearance: 84.9 mL/min (by C-G formula based on SCr of 0.8 mg/dL).  Intake/Output      08/08 0701 - 08/09 0700   I.V. (mL/kg) 2146.2 (45.2)   Other 200   IV Piggyback 500   Total Intake(mL/kg) 2846.2 (59.9)   Urine (mL/kg/hr) 1575 (1.4)   Total Output 1575   Net +1271.2       Urine Occurrence 1 x    - I/O DETAILED x 24h    Total I/O In: 2297.4 [I.V.:1697.4; Other:200; IV Piggyback:400] Out: 450 [Urine:450] - I/O THIS SHIFT    ASSESSMENT Hypokalemia Hypomagnesemia Hypophosphatemia   eICURN Interventions  Replete all 3   ASSESSMENT: MAJOR ELECTROLYTE      Dr. Kalman ShanMurali Nolan Lasser, M.D., [facility].C.C.P Pulmonary and Critical Care Medicine Staff Physician [facility] and Critical Care Pager: [phone], If no answer or between  15:00h - 7:00h: call [phone]  05/10/2016 6:26 AM

## 2016-05-10 NOTE — Progress Notes (Signed)
PULMONARY / CRITICAL CARE MEDICINE   Name: Randy Roach MRN: [id number] DOB: 1976/10/02    ADMISSION DATE:  05/08/2016 CONSULTATION DATE:  05/09/2016  REFERRING MD:  Dr. Renae Gloss  CHIEF COMPLAINT:  Abdominal pain, shortness of breath, and nausea  HISTORY OF PRESENT ILLNESS:   This is a 40 yo male with a PMH of Diabetes Mellitus and ALS.  [facility] with c/o shortness of breath, cough, nausea and fever.  The cough was productive with foul smelling greenish sputum.  The pt is followed by hospice once symptoms developed the family notified hospice, and they recommended the pt should be brought to the emergency department for further evaluation.  At his baseline he is nonverbal and unable to lift his head, however he is cognitively intact and uses his phone to text to communicate.  On 8/8 he became apneic and a code blue was called once the code team arrived at bedside the pt required emergent intubation by the ER physician CPR was not performed the pt never lost his pulse.  PCCM consulted on 8/8 for management of acute hypoxic respiratory failure secondary to pneumonia requiring mechanical intubation.     REVIEW OF SYSTEMS:   Unable to assess pt intubated  SUBJECTIVE:  The pt is mechanically intubated and sedated, remains critically ill Prognosis is poor  VITAL SIGNS: BP 100/73   Pulse 91   Temp 98.7 F (37.1 C) (Oral)   Resp 15   Ht  (1.702 m)   Wt 104 lb 12.8 oz (47.5 kg)   SpO2 97%   BMI 16.41 kg/m   HEMODYNAMICS:    VENTILATOR SETTINGS: Vent Mode: PRVC FiO2 (%):  [35 %] 35 % Set Rate:  [15 bmp] 15 bmp Vt Set:  [500 mL] 500 mL PEEP:  [5 cmH20] 5 cmH20 Plateau Pressure:  [14 cmH20] 14 cmH20  INTAKE / OUTPUT: I/O last 3 completed shifts: In: 4996.2 [I.V.:2146.2; Other:400; IV Piggyback:2450] Out: 1575 [Urine:1575]  PHYSICAL EXAMINATION: General:  Acutely ill appearing, cachetic male Neuro:  Bilateral pupils 2 mm sluggish, follows commands,   HEENT:  Supple, no JVD Cardiovascular:  Sinus tach, s1s2, no M/R/G Lungs:  Course throughout, even, non labored, intubated Abdomen:  +BS x4, soft, non tender, non distended Musculoskeletal:  cachetic Skin:  Intact, no rashes or lesions  LABS:  BMET  Recent Labs Lab 05/08/16 2029 05/09/16 1831 05/10/16 0423  NA 135 138 135  K 4.0 3.4* 3.1*  CL 89* 94* 96*  CO2 36* 38* 32  BUN CREATININE 0.39* 0.38* 0.41*  GLUCOSE 161* 101* 138*    Electrolytes  Recent Labs Lab 05/08/16 2029 05/09/16 1831 05/10/16 0423  CALCIUM 9.7 8.8* 8.1*  MG  --   --  1.5*  PHOS  --   --  <1.0*    CBC  Recent Labs Lab 05/08/16 2029 05/09/16 1831 05/10/16 0423  WBC 15.7* 10.3 9.0  HGB 14.0 11.6* 10.8*  HCT 42.0 33.5* 31.5*  PLT 232 222 219    Coag's No results for input(s): APTT, INR in the last 168 hours.  Sepsis Markers  Recent Labs Lab 05/08/16 2029 05/09/16 0132  LATICACIDVEN 0.8 0.9    ABG  Recent Labs Lab 05/09/16 1600 05/10/16 0430  PHART 7.45 7.52*  PCO2ART 53* 45  PO2ART 134* 101    Liver Enzymes  Recent Labs Lab 05/08/16 2029 05/09/16 1831  AST 15 16  ALT 17 13*  ALKPHOS 68 59  BILITOT 0.8 0.6  ALBUMIN 4.0 2.9*    Cardiac Enzymes  Recent Labs Lab 05/08/16 2029  TROPONINI <0.03    Glucose  Recent Labs Lab 05/09/16 0940 05/09/16 1128 05/09/16 1229 05/09/16 1737 05/10/16 0734  GLUCAP 154* 192* 209* 113* 153*    Imaging Dg Chest Port 1 View  Result Date: 05/10/2016 CLINICAL DATA:  Respiratory failure. EXAM: PORTABLE CHEST 1 VIEW COMPARISON:  05/09/2016. FINDINGS: Endotracheal tube in stable position. Interval removal of left IJ line. Persistent bibasilar pulmonary infiltrates. No prominent pleural effusion or pneumothorax . IMPRESSION: 1. Interim removal of left IJ line. Endotracheal tube in stable position. 2. Persistent bibasilar pulmonary infiltrates.  No interim change. Electronically Signed   By: Maisie Fushomas  Register   On:  05/10/2016 06:35   Dg Chest Port 1 View  Result Date: 05/09/2016 CLINICAL DATA:  Central line placement EXAM: PORTABLE CHEST 1 VIEW COMPARISON:  05/08/2016 FINDINGS: Cardiomediastinal silhouette is stable. Bilateral basal patchy infiltrates again noted suspicious for pneumonia. Endotracheal tube in place with tip 4.8 cm above the carina. There is left IJ central line with tip in subclavian vein just above the left axilla. The central line should be repositioned. No pneumothorax. IMPRESSION: Bilateral basilar infiltrate/ pneumonia again noted. Endotracheal tube in place. Left IJ central line with tip in subclavian vein at the level of axilla. Reposition is recommended. These results were called by telephone at the time of interpretation on 05/09/2016 at 3:19 pm to Nurse Brittney taking care of patient, who verbally acknowledged these results. Electronically Signed   By: Natasha MeadLiviu  Pop M.D.   On: 05/09/2016 15:21    STUDIES:  None  CULTURES: Blood x2 8/7>> Urine>> Sputum>>  ANTIBIOTICS: Cefepime 8/8>> Vancomycin 8/8>>  SIGNIFICANT EVENTS: [facility] of [facility]  8/8-Pt respiratory arrested code blue called required emergent intubation PCCM consulted  LINES/TUBES: ETT 8/8>> Right femoral central line 8/8>> Left IJ central line 8/8>>8/8 Gastrostomy tube 11/26/15  ASSESSMENT / PLAN:  PULMONARY A: Acute hypoxic respiratory failure secondary to Pneumonia  Mechanical Intubation P:   CXR and ABG as needed Full vent support for now wean as tolerated  VAP bundle  Continue prn Duonebs  CARDIOVASCULAR A:  Sinus tachycardia P:  Continuous telemetry monitoring ICu monitoring  RENAL A:   No acute issues P:   Trend BMP's Replace electrolytes as indicated  Foley catheter for strict output  GASTROINTESTINAL A:   No acute issues P:   Continue tube feedings Pepcid for GI prophylaxis   HEMATOLOGIC A:   No acute issues P:  Lovenox for VTE  prophylaxis Monitor for s/sx of bleeding Transfuse for Hgb <7  INFECTIOUS A:   Leukocytosis P:   Continue abx as above Trend WBC and monitor fever curve Follow cultures Trend PCT's  ENDOCRINE A:   Diabetes Mellitus P:   CBG's q4hrs SSI  NEUROLOGIC A:   Hx: ALS P:   RASS goal: 0 to -1 Fentanly gtt to maintain RASS goal Prn Fentanyl for pain management Prn versed to maintain RASS goal  WUA daily   I have personally obtained a history, examined the patient, evaluated Pertinent laboratory and RadioGraphic/imaging results, and  formulated the assessment and plan   The Patient requires high complexity decision making for assessment and support, frequent evaluation and titration of therapies, application of advanced monitoring technologies and extensive interpretation of multiple databases. Critical Care Time devoted to patient care services described in this note is 45 minutes.   Overall, patient is critically ill,  prognosis is guarded.  Patient with Multiorgan failure and at high risk for cardiac arrest and death.  Overall, prognosis is very poor, if family wishes to proceed with medical management, would recommend Trach placement  Josey Forcier Santiago Glad, M.D.  [facility]  Medical Director [facility] Medical Director [facility] Cardio-Pulmonary Department

## 2016-05-10 NOTE — Progress Notes (Signed)
Patient is alert and awake on Vent, fio2 at 35%.  Patient is able to answer yes/no questions and also giving thumbs up for answers.  Patient has noninvasive ventilation and on oxygen therapy findings c/w Chronic resp failure I have explained to patient that he will need Trachea stomy for survival and will need artifical support-vent  for survival. Patient understands that if he did NOT get  [name] he would Die. Patient has nodded yes and gave me a thumbs up to proceed with Trach.  I have discussed this with rest of family, and they have agreed to support patients decision and to proceed with Trach.  Will obtain ENT consult, but will also place DNR orders(no chest compression, no shock therapy)   Will proceed with ENT consult, will proceed with LTACH and long term assistance with home vent referral if possible  Patient and family satisfied with plan of care/management   Randy Roach Santiago Gladavid Joliana Claflin, M.D.  [facility]  Medical Director [facility] Medical Director [facility] Cardio[facility]Pulmonary Department

## 2016-05-10 NOTE — Progress Notes (Signed)
Visit made. Patient seen lying in bed, remains on the ventilator with full support. Cousins at bedside. Patient alert and able to communicate with head nods and eye blinks. He reported neck pain, Staff RN Merry ProudBrandi present and patient received a bolus of fentanyl. Per chart note review and conversation with attending Dr. Belia HemanKasa patient may not be able to be successfully weaned from vent support. Will continue to follow through final disposition. Thank you. Alanson AlyKaren R berson RN, BSN, [facility], [facility] Liaison [phone] c

## 2016-05-10 NOTE — Progress Notes (Signed)
Patient medicated for pain several times during day with fentanyl bolus along with increased rate of fentanyl. Patient is currently resting comfortably. Plan of care has been discussed with family and patient today by care management and attending physician.  Trudee KusterBrandi R Mansfield

## 2016-05-10 NOTE — Progress Notes (Signed)
Nutrition Follow-up  DOCUMENTATION CODES:   Severe malnutrition in context of chronic illness  INTERVENTION:  -TF: pt currently on vent support, nutritional needs reassessed. Recommend changing to continuous feedings of Osmolite 1.2 at rate of 50 ml/hr with Prostat daily providing 1540 kcals, 82 g of protein and 972 mL of free water. Pt at high risk for refeeding syndrome, electrolytes already abnormal, recommend starting TF at rate of 20 ml/hr and advancing by 10 mL q 12 hours until goal rate. Recommend rechecking electrolytes and continuing to supplement until wdl. Discussed TF poc with MD Kasa and received verbal order to change TF prescription. Continue to assess   NUTRITION DIAGNOSIS:   Malnutrition related to chronic illness as evidenced by severe depletion of body fat, severe depletion of muscle mass, percent weight loss.  GOAL:   Patient will meet greater than or equal to 90% of their needs  MONITOR:   TF tolerance, Weight trends  REASON FOR ASSESSMENT:   Malnutrition Screening Tool    ASSESSMENT:    40 yo male admitted with SOB, abdominal pain and nausea. Pt with hx of ALS, PEG tube   Pt s/p respiratory arrest on 8/8 requiring emergent intubation  Osmolite 1.5 bolus feedings ordered, pt received 1 bolus feeding yesterday evening via PEG tube  Patient is currently intubated on ventilator support MV: 7.2 L/min Temp (24hrs), Avg:98.3 F (36.8 C), Min:97.1 F (36.2 C), Max:100 F (37.8 C)  Diet Order:  Diet NPO time specified  Skin:  Reviewed, no issues  Last BM:  no documented BM   Labs: phosphorus <1.0 (supplemented), potassium 3.1 (supplemented), magnesium 1.5  Glucose Profile:   Recent Labs  05/09/16 1737 05/09/16 2151 05/10/16 0734  GLUCAP 113* 75 153*   Meds: NS at 125 ml/hr, ss novolog   Height:   Ht Readings from Last 1 Encounters:  05/08/16 5\' 7"  (1.702 m)    Weight:   Wt Readings from Last 1 Encounters:  05/08/16 104 lb 12.8 oz  (47.5 kg)    Filed Weights   05/08/16 2000  Weight: 104 lb 12.8 oz (47.5 kg)    BMI:  Body mass index is 16.41 kg/m.  Estimated Nutritional Needs:   Kcal:  1623 kcals  Protein:  71-95 g  Fluid:  >/= 1.5 L  EDUCATION NEEDS:   No education needs identified at this time  Romelle StarcherCate Val Farnam MS, RD, LDN [phone] Pager  [phone] Weekend/On-Call Pager

## 2016-05-10 NOTE — Progress Notes (Signed)
CRITICAL VALUE ALERT  Critical value received:  Phos <1.0 , K+ 3.1, Mag 1.5  Date of notification:  05/10/16  Time of notification:  0604  Critical value read back:Yes.    Nurse who received alert:  Imma RN  MD notified (1st page):  Dr Sandria Manlyamaswany   Time of first page:  0604  MD notified (2nd page):  Time of second page:  Responding MD:  Dr Sandria Manlyamaswany  Time MD responded:  [phone]

## 2016-05-10 NOTE — Consult Note (Signed)
MEDICATION RELATED CONSULT NOTE - INITIAL   Pharmacy Consult for electrolytes Indication: hypokalemia, hypophos  No Known Allergies  Patient Measurements: Height: _0  (170.2 cm) Weight: 104 lb 12.8 oz (47.5 kg) IBW/kg (Calculated) : 66.1 Adjusted Body Weight:   Vital Signs: Temp: 98.1 F (36.7 C) (08/09 1600) Temp Source: Oral (08/09 1600) BP: 99/79 (08/09 1800) Pulse Rate: 93 (08/09 1800) Intake/Output from previous day: 08/08 0701 - 08/09 0700 In: 3046.2 [I.V.:2146.2; IV Piggyback:500] Out: 7681 [Urine:1575] Intake/Output from this shift: No intake/output data recorded.  Labs:  Recent Labs  05/08/16 2029 05/09/16 1831 05/10/16 0423 05/10/16 1813  WBC 15.7* 10.3 9.0  --   HGB 14.0 11.6* 10.8*  --   HCT 42.0 33.5* 31.5*  --   PLT 232 222 219  --   CREATININE 0.39* 0.38* 0.41* 0.36*  MG  --   --  1.5* 2.0  PHOS  --   --  <1.0* 2.1*  ALBUMIN 4.0 2.9*  --   --   PROT 7.5 5.8*  --   --   AST 15 16  --   --   ALT 17 13*  --   --   ALKPHOS 68 59  --   --   BILITOT 0.8 0.6  --   --    Estimated Creatinine Clearance: 84.9 mL/min (by C-G formula based on SCr of 0.8 mg/dL).   Microbiology: Recent Results (from the past 720 hour(s))  Urine culture     Status: None   Collection Time: 05/08/16  6:31 PM  Result Value Ref Range Status   Specimen Description URINE, RANDOM  Final   Special Requests NONE  Final   Culture NO GROWTH Performed at [facility]   Final   Report Status 05/10/2016 FINAL  Final  Blood Culture (routine x 2)     Status: None (Preliminary result)   Collection Time: 05/08/16  8:37 PM  Result Value Ref Range Status   Specimen Description BLOOD RIGHT FATTY CASTS  Final   Special Requests BOTTLES DRAWN AEROBIC AND ANAEROBIC 10CC  Final   Culture NO GROWTH 2 DAYS  Final   Report Status PENDING  Incomplete  Blood Culture (routine x 2)     Status: None (Preliminary result)   Collection Time: 05/08/16  8:39 PM  Result Value Ref Range Status    Specimen Description BLOOD LEFT WRIST  Final   Special Requests   Final    BOTTLES DRAWN AEROBIC AND ANAEROBIC 8CC AERO 6CC ANA   Culture NO GROWTH 2 DAYS  Final   Report Status PENDING  Incomplete  MRSA PCR Screening     Status: None   Collection Time: 05/09/16  3:09 PM  Result Value Ref Range Status   MRSA by PCR NEGATIVE NEGATIVE Final    Comment:        The GeneXpert MRSA Assay (FDA approved for NASAL specimens only), is one component of a comprehensive MRSA colonization surveillance program. It is not intended to diagnose MRSA infection nor to guide or monitor treatment for MRSA infections.   Culture, expectorated sputum-assessment     Status: None (Preliminary result)   Collection Time: 05/09/16  4:50 PM  Result Value Ref Range Status   Specimen Description EXPECTORATED SPUTUM  Final   Special Requests Normal  Final   Sputum evaluation THIS SPECIMEN IS ACCEPTABLE FOR SPUTUM CULTURE  Final   Report Status PENDING  Incomplete  Culture, respiratory (NON-Expectorated)     Status: None (Preliminary  result)   Collection Time: 05/09/16  4:50 PM  Result Value Ref Range Status   Specimen Description EXPECTORATED SPUTUM  Final   Special Requests Normal Reflexed from [id number]  Final   Gram Stain   Final    ABUNDANT WBC PRESENT,BOTH PMN AND MONONUCLEAR MODERATE GRAM POSITIVE COCCI IN PAIRS FEW GRAM NEGATIVE RODS    Culture   Final    NO GROWTH < 24 HOURS Performed at [facility]    Report Status PENDING  Incomplete    Medical History: Past Medical History:  Diagnosis Date  . 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 Diabetes mellitus without complication (HCC)     Medications:  Scheduled:  . amitriptyline  25 mg Oral QHS  . antiseptic oral rinse  7 mL Mouth Rinse QID  . chlorhexidine gluconate (SAGE KIT)  15 mL Mouth Rinse BID  . enoxaparin (LOVENOX) injection  30 mg Subcutaneous Q24H  . famotidine (PEPCID) IV  20 mg Intravenous Q12H  . feeding  supplement (PRO-STAT SUGAR FREE 64)  30 mL Per Tube Daily  . free water  30 mL Per Tube Q4H  . insulin aspart  0-5 Units Subcutaneous QHS  . insulin aspart  0-9 Units Subcutaneous TID WC  . piperacillin-tazobactam (ZOSYN)  IV  3.375 g Intravenous Q8H  . potassium chloride  20 mEq Oral Once  . potassium phosphate IVPB (mmol)  10 mmol Intravenous Once  . riluzole  50 mg Per Tube Q12H    Assessment: Pt is a 40 year old male with hypokalemia and hypophosphours K=3.4 Phos=2.1 Mag=2.0  Goal of Therapy:  Normalization of electrolytes  Plan:  Will give kcl packet 20MEQ once and Kphos 12mol IV once. Will recheck levels in the AM.  Randy Roach D Randy Roach 05/10/2016,7:20 PM

## 2016-05-10 NOTE — Care Management (Signed)
Carla with [facility] can take patient tomorrow. Unit clerk updated.

## 2016-05-10 NOTE — Progress Notes (Signed)
Per chart note review and discussion with CMRN Marshell Garfinkel, patient and his mother, plan is for tracheostomy placement and discharge to [facility]. Writer met in the patient's room with he and his mother as well as his aunt and CMRN Levada Dy. Patient and his mother Candy Sledge voiced understanding that transferring to the [facility] will require revocation of the hospice Medicare benefit. Will continue to follow through final disposition. Thank you.  Flo Shanks RN, BSN, [facility] [place], hospital Liaison [phone] c

## 2016-05-10 NOTE — Care Management (Addendum)
I have sent referral to both LTAC's Randy Roach with [facility] and Randy Roach with [facility]) to review chart to see if patient is appropriate for LTAC. I have updated Randy Roach with [facility] also. Hospice services will not be available to patient with trach/vent support. He is now partial code.  RNCM will continue to follow. [facility] has contacted this RNCM and can accept patient and place trach at their facility; still waiting on [facility]. Spoke with patient's mother Randy Roach [phone]. She understands that hospice will be revoked and states that "patient made that decision and she supports it". She was provided chose between [facility] and [facility]- she is familiar with [facility] (said she did clinicals there) and would like for patient to go to [facility]. I discussed long-term goals with her about patient and she would like for patient to return to his home where she and her sister Randy Roach care for him with private duty nursing through [facility]. [facility] is currently checking patient LTAC benefits and will call this RNCM back with results but anticipates that they can take patient in AM and place trach. Randy Roach with hospice updated. Met with patient, aunt, mother, and Randy Roach with hospice- patient agrees with plan for transfer to [facility] potentially tomorrow if accepted and trach placement there.

## 2016-05-10 NOTE — Care Management (Signed)
Message left for patient's mother Dyane DustmanSheila Black [phone] to call RNCM to discuss discharge planning. Patient is currently open to [facility].

## 2016-05-11 ENCOUNTER — Ambulatory Visit (HOSPITAL_COMMUNITY)
Admission: AD | Admit: 2016-05-11 | Discharge: 2016-05-11 | Disposition: A | Discharge status: Home / Self Care | Payer: Medicare Other | Source: Other Acute Inpatient Hospital | Attending: Internal Medicine | Admitting: Internal Medicine

## 2016-05-11 DIAGNOSIS — A419 Sepsis, unspecified organism: Secondary | ICD-10-CM | POA: Insufficient documentation

## 2016-05-11 LAB — MAGNESIUM: Magnesium: 1.8 mg/dL (ref 1.7–2.4)

## 2016-05-11 LAB — POTASSIUM: Potassium: 3.6 mmol/L (ref 3.5–5.1)

## 2016-05-11 LAB — EXPECTORATED SPUTUM ASSESSMENT W GRAM STAIN, RFLX TO RESP C

## 2016-05-11 LAB — PHOSPHORUS: PHOSPHORUS: 2.5 mg/dL (ref 2.5–4.6)

## 2016-05-11 LAB — GLUCOSE, CAPILLARY
Glucose-Capillary: 123 mg/dL — ABNORMAL HIGH (ref 65–99)
Glucose-Capillary: 75 mg/dL (ref 65–99)

## 2016-05-11 LAB — EXPECTORATED SPUTUM ASSESSMENT W REFEX TO RESP CULTURE: SPECIAL REQUESTS: NORMAL

## 2016-05-11 MED ORDER — FENTANYL 2500MCG IN NS 250ML (10MCG/ML) PREMIX INFUSION: 25.0000 ug/h | INTRAVENOUS | 0 refills | Status: DC

## 2016-05-11 MED ORDER — OSMOLITE 1.2 CAL PO LIQD: 1000.0000 mL | ORAL | 0 refills | Status: DC

## 2016-05-11 MED ORDER — IPRATROPIUM-ALBUTEROL 0.5-2.5 (3) MG/3ML IN SOLN
3.0000 mL | RESPIRATORY_TRACT | 0 refills | Status: DC | PRN
Start: 2016-05-11 — End: 2019-07-24

## 2016-05-11 MED ORDER — ACETAMINOPHEN 325 MG PO TABS
650.0000 mg | ORAL_TABLET | Freq: Four times a day (QID) | ORAL | 0 refills | Status: AC | PRN
Start: 2016-05-11 — End: ?

## 2016-05-11 MED ORDER — CHLORHEXIDINE GLUCONATE 0.12% ORAL RINSE (MEDLINE KIT): 15.0000 mL | Freq: Two times a day (BID) | OROMUCOSAL | 0 refills | Status: AC

## 2016-05-11 MED ORDER — PIPERACILLIN-TAZOBACTAM 3.375 G IVPB: 3.3750 g | Freq: Three times a day (TID) | INTRAVENOUS | 0 refills | Status: DC

## 2016-05-11 NOTE — Progress Notes (Signed)
Pt has remained alert and oriented. Pt is non-verbal at baseline, but is able to communicate needs via text/writing and nodding head. Pt has remained on 28% FiO2 sating 99%. Fentanyl gtt currently infusing at 300 mcgs. Pt frequently c/o generalized pain. NSR on cardiac monitor. Report has been called to Ethelene BrownsAnthony, Charity fundraiserN [facility]. Currently awaiting [facility].

## 2016-05-11 NOTE — Progress Notes (Signed)
Visit made. Patient seen sleeping. He remains on the ventilator. Tube feedings  At 30 cc/hr with goal of 50cc. Plan remains for discharge to [place] today. Per conversation with staff RN Jerene Pitch, [facility] has been notified for transport. Family not present, Probation officer met yesterday with patient and his mother. Both aware of revocation of hospice benefit. Thank you.  Flo Shanks RN, BSN, [place] [facility] [place], [facility] [phone] c

## 2016-05-11 NOTE — Care Management (Addendum)
I have updated Olegario MessierKathy with [facility] [phone] of MD plan to transfer patient to [facility] today. Spoke with patient's mother Velna HatchetSheila and update her that Olegario MessierKathy would be contacting her this morning. [facility] is ready for transfer; mom wants to be notified. [facility] shared with RN: [phone] Dr. Leonia ReaderVan Eyk room 418. Updated Karen with Hospice. No other RNCM needs. Case closed.

## 2016-05-11 NOTE — Discharge Summary (Signed)
PULMONARY / CRITICAL CARE MEDICINE   Name: Randy Roach MRN: [id number] DOB: 1975/10/15    ADMISSION DATE:  05/08/2016 CONSULTATION DATE:  05/09/2016 DISHCHARGE DATE 05/11/2016  Admitting DX acute resp failure from pneumonia H/o ALS with chronic resp failure  Discharge DX resp failure, ALS REFERRING MD:  Dr. Leslye Peer  CHIEF COMPLAINT:  Abdominal pain, shortness of breath, and nausea  HISTORY OF PRESENT ILLNESS:   This is a 40 yo male with a PMH of Diabetes Mellitus and ALS.  He presented to [facility] ER on 8/7 with c/o shortness of breath, cough, nausea and fever.  The cough was productive with foul smelling greenish sputum.  The pt is followed by hospice once symptoms developed the family notified hospice, and they recommended the pt should be brought to the emergency department for further evaluation.  At his baseline he is nonverbal and unable to lift his head, however he is cognitively intact and uses his phone to text to communicate.  On 8/8 he became apneic and a code blue was called once the code team arrived at bedside the pt required emergent intubation by the ER physician CPR was not performed the pt never lost his pulse.  PCCM consulted on 8/8 for management of acute hypoxic respiratory failure secondary to pneumonia requiring mechanical intubation.  Hospital course 8/7 admitted for acute pneumonia 8/9 admitted to ICU for acute resp failure Family and patient discussion about trach and LTACH referral 8/10 plan to d/c to [facility] today   REVIEW OF SYSTEMS:   Unable to assess pt intubated  SUBJECTIVE:  The pt is mechanically intubated and sedated, remains critically ill Prognosis is poor  VITAL SIGNS: BP 98/73   Pulse 79   Temp 98.1 F (36.7 C) (Axillary)   Resp 15   Ht _0  (1.702 m)   Wt 104 lb 12.8 oz (47.5 kg)   SpO2 100%   BMI 16.41 kg/m   VENTILATOR SETTINGS: Vent Mode: PRVC FiO2 (%):  [28 %-35 %] 28 % Set Rate:  [15 bmp] 15 bmp Vt Set:  [500 mL] 500 mL PEEP:   [5 cmH20] 5 cmH20 Plateau Pressure:  [15 cmH20] 15 cmH20  INTAKE / OUTPUT: I/O last 3 completed shifts: In: 7621.8 [I.V.:5047.5; Other:400; NG/GT:514.3; IV Piggyback:1660] Out: 3350 [Urine:2650; Drains:700]  PHYSICAL EXAMINATION: General:  Acutely ill appearing, cachetic male Neuro:  Bilateral pupils 2 mm sluggish, follows commands,  HEENT:  Supple, no JVD Cardiovascular:  Sinus tach, s1s2, no M/R/G Lungs:  Course throughout, even, non labored, intubated Abdomen:  +BS x4, soft, non tender, non distended Musculoskeletal:  cachetic Skin:  Intact, no rashes or lesions  LABS:  BMET  Recent Labs Lab 05/09/16 1831 05/10/16 0423 05/10/16 1813 05/10/16 1926 05/11/16 0528  NA 138 135 132*  --   --   K 3.4* 3.1* 3.4*  --  3.6  CL 94* 96* 97*  --   --   CO2 38* 32 32  --   --   BUN _1 --   --   CREATININE 0.38* 0.41* 0.36* 0.33*  --   GLUCOSE 101* 138* 89  --   --     Electrolytes  Recent Labs Lab 05/09/16 1831 05/10/16 0423 05/10/16 1813 05/11/16 0528  CALCIUM 8.8* 8.1* 7.6*  --   MG  --  1.5* 2.0 1.8  PHOS  --  <1.0* 2.1* 2.5    CBC  Recent Labs Lab 05/08/16 2029 05/09/16 1831 05/10/16 0423  WBC 15.7*  10.3 9.0  HGB 14.0 11.6* 10.8*  HCT 42.0 33.5* 31.5*  PLT 232 222 219    Coag's No results for input(s): APTT, INR in the last 168 hours.  Sepsis Markers  Recent Labs Lab 05/08/16 2029 05/09/16 0132  LATICACIDVEN 0.8 0.9    ABG  Recent Labs Lab 05/09/16 1600 05/10/16 0430  PHART 7.45 7.52*  PCO2ART 53* 45  PO2ART 134* 101    Liver Enzymes  Recent Labs Lab 05/08/16 2029 05/09/16 1831  AST 15 16  ALT 17 13*  ALKPHOS 68 59  BILITOT 0.8 0.6  ALBUMIN 4.0 2.9*    Cardiac Enzymes  Recent Labs Lab 05/08/16 2029  TROPONINI <0.03    Glucose  Recent Labs Lab 05/09/16 2151 05/10/16 0734 05/10/16 1137 05/10/16 1615 05/10/16 2116 05/11/16 0732  GLUCAP 75 153* 92 136* 119* 123*    Imaging No results  found.  STUDIES:  None  CULTURES: Blood x2 8/7>> Urine>> Sputum>>  ANTIBIOTICS: Cefepime 8/8>> Vancomycin 8/8>>  SIGNIFICANT EVENTS: 8/7-Pt admitted to [facility] with diagnosis of pneumonia  8/8-Pt respiratory arrested code blue called required emergent intubation PCCM consulted  LINES/TUBES: ETT 8/8>> Right femoral central line 8/8>> Left IJ central line 8/8>>8/8 Gastrostomy tube 11/26/15   Scheduled Meds:Discharge Medications  . amitriptyline  25 mg Oral QHS  . antiseptic oral rinse  7 mL Mouth Rinse QID  . chlorhexidine gluconate (SAGE KIT)  15 mL Mouth Rinse BID  . enoxaparin (LOVENOX) injection  30 mg Subcutaneous Q24H  . famotidine (PEPCID) IV  20 mg Intravenous Q12H  . feeding supplement (PRO-STAT SUGAR FREE 64)  30 mL Per Tube Daily  . free water  30 mL Per Tube Q4H  . insulin aspart  0-5 Units Subcutaneous QHS  . insulin aspart  0-9 Units Subcutaneous TID WC  . piperacillin-tazobactam (ZOSYN)  IV  3.375 g Intravenous Q8H  . riluzole  50 mg Per Tube Q12H   Continuous Infusions: . sodium chloride 125 mL/hr at 05/11/16 0700  . feeding supplement (OSMOLITE 1.2 CAL) 1,000 mL (05/11/16 0700)  . fentaNYL infusion INTRAVENOUS 275 mcg/hr (05/11/16 0700)  . norepinephrine Stopped (05/10/16 0813)  . propofol (DIPRIVAN) infusion Stopped (05/09/16 2052)   PRN Meds:.acetaminophen **OR** acetaminophen, fentaNYL, ipratropium-albuterol, midazolam, ondansetron **OR** ondansetron (ZOFRAN) IV    Patient to be transferred to [facility] for further vent management and weaning Will need Trach for Survival    PULMONARY A: Acute hypoxic respiratory failure secondary to Pneumonia  Mechanical Intubation P:   CXR and ABG as needed Full vent support for now wean as tolerated  VAP bundle  Continue prn Duonebs  CARDIOVASCULAR A:  Sinus tachycardia P:  Continuous telemetry monitoring ICu monitoring  RENAL A:   No acute issues P:   Trend BMP's Replace electrolytes as  indicated  Foley catheter for strict output  GASTROINTESTINAL A:   No acute issues P:   Continue tube feedings Pepcid for GI prophylaxis    INFECTIOUS A:  pneumonia Leukocytosis P:   Continue abx as above   Corrin Parker, M.D.  [facility]  Medical Director [place] Director [place] Department

## 2016-05-12 LAB — CULTURE, RESPIRATORY

## 2016-05-12 LAB — CULTURE, RESPIRATORY W GRAM STAIN
Culture: NORMAL
Special Requests: NORMAL

## 2016-05-13 LAB — CULTURE, BLOOD (ROUTINE X 2)
CULTURE: NO GROWTH
CULTURE: NO GROWTH

## 2016-12-05 DIAGNOSIS — F419 Anxiety disorder, unspecified: Secondary | ICD-10-CM | POA: Insufficient documentation

## 2016-12-05 DIAGNOSIS — D649 Anemia, unspecified: Secondary | ICD-10-CM

## 2016-12-05 DIAGNOSIS — F32A Depression, unspecified: Secondary | ICD-10-CM | POA: Insufficient documentation

## 2016-12-08 ENCOUNTER — Encounter: Payer: Self-pay | Admitting: Emergency Medicine

## 2016-12-08 ENCOUNTER — Emergency Department: Payer: Medicare Other

## 2016-12-08 ENCOUNTER — Inpatient Hospital Stay
Admission: EM | Admit: 2016-12-08 | Discharge: 2016-12-12 | DRG: 871 | Disposition: A | Discharge status: Home / Self Care | Payer: Medicare Other | Attending: Internal Medicine | Admitting: Internal Medicine

## 2016-12-08 DIAGNOSIS — G8929 Other chronic pain: Secondary | ICD-10-CM | POA: Diagnosis present

## 2016-12-08 DIAGNOSIS — D638 Anemia in other chronic diseases classified elsewhere: Secondary | ICD-10-CM | POA: Diagnosis present

## 2016-12-08 DIAGNOSIS — Z7952 Long term (current) use of systemic steroids: Secondary | ICD-10-CM | POA: Diagnosis not present

## 2016-12-08 DIAGNOSIS — Y848 Other medical procedures as the cause of abnormal reaction of the patient, or of later complication, without mention of misadventure at the time of the procedure: Secondary | ICD-10-CM | POA: Diagnosis present

## 2016-12-08 DIAGNOSIS — B957 Other staphylococcus as the cause of diseases classified elsewhere: Secondary | ICD-10-CM | POA: Diagnosis present

## 2016-12-08 DIAGNOSIS — Z7901 Long term (current) use of anticoagulants: Secondary | ICD-10-CM

## 2016-12-08 DIAGNOSIS — J181 Lobar pneumonia, unspecified organism: Secondary | ICD-10-CM | POA: Diagnosis not present

## 2016-12-08 DIAGNOSIS — J189 Pneumonia, unspecified organism: Secondary | ICD-10-CM

## 2016-12-08 DIAGNOSIS — I9589 Other hypotension: Secondary | ICD-10-CM | POA: Diagnosis present

## 2016-12-08 DIAGNOSIS — R7881 Bacteremia: Secondary | ICD-10-CM | POA: Diagnosis not present

## 2016-12-08 DIAGNOSIS — J9509 Other tracheostomy complication: Secondary | ICD-10-CM | POA: Diagnosis present

## 2016-12-08 DIAGNOSIS — Z820 Family history of epilepsy and other diseases of the nervous system: Secondary | ICD-10-CM | POA: Diagnosis not present

## 2016-12-08 DIAGNOSIS — Z930 Tracheostomy status: Secondary | ICD-10-CM

## 2016-12-08 DIAGNOSIS — A419 Sepsis, unspecified organism: Principal | ICD-10-CM | POA: Diagnosis present

## 2016-12-08 DIAGNOSIS — F329 Major depressive disorder, single episode, unspecified: Secondary | ICD-10-CM | POA: Diagnosis present

## 2016-12-08 DIAGNOSIS — Z7401 Bed confinement status: Secondary | ICD-10-CM

## 2016-12-08 DIAGNOSIS — Z9911 Dependence on respirator [ventilator] status: Secondary | ICD-10-CM | POA: Diagnosis not present

## 2016-12-08 DIAGNOSIS — Z87891 Personal history of nicotine dependence: Secondary | ICD-10-CM

## 2016-12-08 DIAGNOSIS — J95851 Ventilator associated pneumonia: Secondary | ICD-10-CM

## 2016-12-08 DIAGNOSIS — D649 Anemia, unspecified: Secondary | ICD-10-CM

## 2016-12-08 DIAGNOSIS — E1165 Type 2 diabetes mellitus with hyperglycemia: Secondary | ICD-10-CM | POA: Diagnosis present

## 2016-12-08 DIAGNOSIS — Z79899 Other long term (current) drug therapy: Secondary | ICD-10-CM | POA: Diagnosis not present

## 2016-12-08 DIAGNOSIS — J9611 Chronic respiratory failure with hypoxia: Secondary | ICD-10-CM | POA: Diagnosis not present

## 2016-12-08 DIAGNOSIS — Y95 Nosocomial condition: Secondary | ICD-10-CM | POA: Diagnosis present

## 2016-12-08 DIAGNOSIS — J0410 Acute tracheitis without obstruction: Secondary | ICD-10-CM | POA: Diagnosis present

## 2016-12-08 DIAGNOSIS — M549 Dorsalgia, unspecified: Secondary | ICD-10-CM | POA: Diagnosis present

## 2016-12-08 DIAGNOSIS — R739 Hyperglycemia, unspecified: Secondary | ICD-10-CM

## 2016-12-08 DIAGNOSIS — M62838 Other muscle spasm: Secondary | ICD-10-CM | POA: Diagnosis present

## 2016-12-08 DIAGNOSIS — F419 Anxiety disorder, unspecified: Secondary | ICD-10-CM | POA: Diagnosis present

## 2016-12-08 DIAGNOSIS — J9612 Chronic respiratory failure with hypercapnia: Secondary | ICD-10-CM | POA: Diagnosis not present

## 2016-12-08 DIAGNOSIS — G1221 Amyotrophic lateral sclerosis: Secondary | ICD-10-CM | POA: Diagnosis present

## 2016-12-08 DIAGNOSIS — J9621 Acute and chronic respiratory failure with hypoxia: Secondary | ICD-10-CM | POA: Diagnosis present

## 2016-12-08 LAB — URINALYSIS, ROUTINE W REFLEX MICROSCOPIC
BILIRUBIN URINE: NEGATIVE
Glucose, UA: NEGATIVE mg/dL
HGB URINE DIPSTICK: NEGATIVE
KETONES UR: NEGATIVE mg/dL
Leukocytes, UA: NEGATIVE
NITRITE: NEGATIVE
Protein, ur: NEGATIVE mg/dL
Specific Gravity, Urine: 1.004 — ABNORMAL LOW (ref 1.005–1.030)
pH: 7 (ref 5.0–8.0)

## 2016-12-08 LAB — PROTIME-INR
INR: 0.92
PROTHROMBIN TIME: 12.4 s (ref 11.4–15.2)

## 2016-12-08 LAB — LIPASE, BLOOD: LIPASE: 11 U/L (ref 11–51)

## 2016-12-08 LAB — COMPREHENSIVE METABOLIC PANEL
ALBUMIN: 3.9 g/dL (ref 3.5–5.0)
ALT: 38 U/L (ref 17–63)
ANION GAP: 8 (ref 5–15)
AST: 32 U/L (ref 15–41)
Alkaline Phosphatase: 108 U/L (ref 38–126)
BILIRUBIN TOTAL: 0.1 mg/dL — AB (ref 0.3–1.2)
BUN: 14 mg/dL (ref 6–20)
CO2: 28 mmol/L (ref 22–32)
Calcium: 9.2 mg/dL (ref 8.9–10.3)
Chloride: 102 mmol/L (ref 101–111)
Creatinine, Ser: 0.46 mg/dL — ABNORMAL LOW (ref 0.61–1.24)
GLUCOSE: 176 mg/dL — AB (ref 65–99)
POTASSIUM: 4.1 mmol/L (ref 3.5–5.1)
Sodium: 138 mmol/L (ref 135–145)
TOTAL PROTEIN: 8 g/dL (ref 6.5–8.1)

## 2016-12-08 LAB — CBC WITH DIFFERENTIAL/PLATELET
BASOS ABS: 0 10*3/uL (ref 0–0.1)
BASOS PCT: 1 %
EOS ABS: 0.2 10*3/uL (ref 0–0.7)
Eosinophils Relative: 4 %
HCT: 30.1 % — ABNORMAL LOW (ref 40.0–52.0)
HEMOGLOBIN: 10.5 g/dL — AB (ref 13.0–18.0)
Lymphocytes Relative: 24 %
Lymphs Abs: 1.3 10*3/uL (ref 1.0–3.6)
MCH: 30.5 pg (ref 26.0–34.0)
MCHC: 35 g/dL (ref 32.0–36.0)
MCV: 87.3 fL (ref 80.0–100.0)
Monocytes Absolute: 0.5 10*3/uL (ref 0.2–1.0)
Monocytes Relative: 9 %
NEUTROS PCT: 62 %
Neutro Abs: 3.4 10*3/uL (ref 1.4–6.5)
PLATELETS: 409 10*3/uL (ref 150–440)
RBC: 3.44 MIL/uL — AB (ref 4.40–5.90)
RDW: 12.7 % (ref 11.5–14.5)
WBC: 5.5 10*3/uL (ref 3.8–10.6)

## 2016-12-08 LAB — EXPECTORATED SPUTUM ASSESSMENT W REFEX TO RESP CULTURE

## 2016-12-08 LAB — EXPECTORATED SPUTUM ASSESSMENT W GRAM STAIN, RFLX TO RESP C

## 2016-12-08 LAB — TROPONIN I

## 2016-12-08 LAB — MRSA PCR SCREENING: MRSA BY PCR: NEGATIVE

## 2016-12-08 LAB — GLUCOSE, CAPILLARY
GLUCOSE-CAPILLARY: 133 mg/dL — AB (ref 65–99)
Glucose-Capillary: 114 mg/dL — ABNORMAL HIGH (ref 65–99)

## 2016-12-08 LAB — PROCALCITONIN

## 2016-12-08 LAB — LACTIC ACID, PLASMA: Lactic Acid, Venous: 1.3 mmol/L (ref 0.5–1.9)

## 2016-12-08 MED ORDER — ONDANSETRON HCL 4 MG/2ML IJ SOLN: 4.0000 mg | Freq: Four times a day (QID) | INTRAMUSCULAR | Status: DC | PRN

## 2016-12-08 MED ORDER — OXYCODONE HCL 5 MG PO TABS
10.0000 mg | ORAL_TABLET | ORAL | Status: DC | PRN
  Administered 2016-12-08 – 2016-12-12 (×9): 10 mg
  Filled 2016-12-08 (×9): qty 2

## 2016-12-08 MED ORDER — SODIUM CHLORIDE 0.9 % IV BOLUS (SEPSIS)
1000.0000 mL | Freq: Once | INTRAVENOUS | Status: AC
  Administered 2016-12-08: 1000 mL via INTRAVENOUS

## 2016-12-08 MED ORDER — CEFEPIME-DEXTROSE 1 GM/50ML IV SOLR
1.0000 g | Freq: Once | INTRAVENOUS | Status: AC
  Administered 2016-12-08: 1 g via INTRAVENOUS
  Filled 2016-12-08: qty 50

## 2016-12-08 MED ORDER — ENOXAPARIN SODIUM 40 MG/0.4ML ~~LOC~~ SOLN: 30.0000 mg | SUBCUTANEOUS | Status: DC

## 2016-12-08 MED ORDER — ACETAMINOPHEN 325 MG PO TABS: 650.0000 mg | ORAL_TABLET | Freq: Four times a day (QID) | ORAL | Status: DC | PRN

## 2016-12-08 MED ORDER — POLYETHYLENE GLYCOL 3350 17 G PO PACK
17.0000 g | PACK | Freq: Every day | ORAL | Status: DC
  Administered 2016-12-11 – 2016-12-12 (×2): 17 g
  Filled 2016-12-08 (×3): qty 1

## 2016-12-08 MED ORDER — FAMOTIDINE 20 MG PO TABS
20.0000 mg | ORAL_TABLET | Freq: Every day | ORAL | Status: DC
  Administered 2016-12-08 – 2016-12-12 (×5): 20 mg
  Filled 2016-12-08 (×5): qty 1

## 2016-12-08 MED ORDER — SODIUM CHLORIDE 0.9% FLUSH
3.0000 mL | Freq: Two times a day (BID) | INTRAVENOUS | Status: DC
  Administered 2016-12-08 – 2016-12-11 (×4): 3 mL via INTRAVENOUS

## 2016-12-08 MED ORDER — DEXTROSE 5 % IV SOLN
2.0000 g | Freq: Three times a day (TID) | INTRAVENOUS | Status: DC
  Administered 2016-12-09 – 2016-12-12 (×12): 2 g via INTRAVENOUS
  Filled 2016-12-08 (×16): qty 2

## 2016-12-08 MED ORDER — VANCOMYCIN HCL IN DEXTROSE 750-5 MG/150ML-% IV SOLN
750.0000 mg | Freq: Two times a day (BID) | INTRAVENOUS | Status: DC
  Administered 2016-12-09: 750 mg via INTRAVENOUS
  Filled 2016-12-08 (×3): qty 150

## 2016-12-08 MED ORDER — OSMOLITE 1.2 CAL PO LIQD: 240.0000 mL | ORAL | Status: DC

## 2016-12-08 MED ORDER — LEVOFLOXACIN IN D5W 750 MG/150ML IV SOLN
750.0000 mg | Freq: Once | INTRAVENOUS | Status: AC
  Administered 2016-12-08: 750 mg via INTRAVENOUS
  Filled 2016-12-08: qty 150

## 2016-12-08 MED ORDER — OXYCODONE HCL 5 MG PO TABS
20.0000 mg | ORAL_TABLET | Freq: Four times a day (QID) | ORAL | Status: DC | PRN
  Administered 2016-12-09 – 2016-12-12 (×6): 20 mg
  Filled 2016-12-08 (×6): qty 4

## 2016-12-08 MED ORDER — RILUZOLE 50 MG PO TABS
50.0000 mg | ORAL_TABLET | Freq: Two times a day (BID) | ORAL | Status: DC
  Administered 2016-12-08 – 2016-12-12 (×8): 50 mg
  Filled 2016-12-08: qty 1

## 2016-12-08 MED ORDER — AMITRIPTYLINE HCL 10 MG PO TABS
25.0000 mg | ORAL_TABLET | Freq: Every day | ORAL | Status: DC
  Administered 2016-12-08 – 2016-12-11 (×4): 25 mg via ORAL
  Filled 2016-12-08: qty 3
  Filled 2016-12-08: qty 1
  Filled 2016-12-08 (×2): qty 3
  Filled 2016-12-08: qty 2

## 2016-12-08 MED ORDER — SODIUM CHLORIDE 0.9 % IV SOLN
INTRAVENOUS | Status: DC
  Administered 2016-12-08: via INTRAVENOUS

## 2016-12-08 MED ORDER — SODIUM CHLORIDE 0.9 % IV SOLN: 250.0000 mL | INTRAVENOUS | Status: DC | PRN

## 2016-12-08 MED ORDER — INSULIN ASPART 100 UNIT/ML ~~LOC~~ SOLN: 0.0000 [IU] | Freq: Every day | SUBCUTANEOUS | Status: DC

## 2016-12-08 MED ORDER — SODIUM CHLORIDE 0.9% FLUSH
3.0000 mL | Freq: Two times a day (BID) | INTRAVENOUS | Status: DC
  Administered 2016-12-08 – 2016-12-12 (×8): 3 mL via INTRAVENOUS

## 2016-12-08 MED ORDER — CLONAZEPAM 0.5 MG PO TABS
0.5000 mg | ORAL_TABLET | Freq: Two times a day (BID) | ORAL | Status: DC | PRN
  Administered 2016-12-08 – 2016-12-11 (×3): 0.5 mg
  Filled 2016-12-08 (×3): qty 1

## 2016-12-08 MED ORDER — HYDROCERIN EX CREA
1.0000 "application " | TOPICAL_CREAM | Freq: Two times a day (BID) | CUTANEOUS | Status: DC
  Administered 2016-12-08 – 2016-12-12 (×6): 1 via TOPICAL
  Filled 2016-12-08: qty 113

## 2016-12-08 MED ORDER — SCOPOLAMINE 1 MG/3DAYS TD PT72
1.0000 | MEDICATED_PATCH | TRANSDERMAL | Status: DC
  Administered 2016-12-08: 1.5 mg via TRANSDERMAL
  Filled 2016-12-08: qty 1

## 2016-12-08 MED ORDER — OSMOLITE 1.5 CAL PO LIQD
237.0000 mL | ORAL | Status: DC
  Administered 2016-12-09 – 2016-12-10 (×6): 237 mL

## 2016-12-08 MED ORDER — DULOXETINE HCL 30 MG PO CPEP
60.0000 mg | ORAL_CAPSULE | Freq: Every day | ORAL | Status: DC
  Administered 2016-12-09 – 2016-12-12 (×4): 60 mg via ORAL
  Filled 2016-12-08 (×4): qty 2

## 2016-12-08 MED ORDER — TIZANIDINE HCL 4 MG PO TABS
4.0000 mg | ORAL_TABLET | Freq: Three times a day (TID) | ORAL | Status: DC | PRN
  Administered 2016-12-08 – 2016-12-12 (×9): 4 mg
  Filled 2016-12-08 (×9): qty 1

## 2016-12-08 MED ORDER — SODIUM CHLORIDE 0.9% FLUSH
3.0000 mL | INTRAVENOUS | Status: DC | PRN
  Administered 2016-12-11: 3 mL via INTRAVENOUS
  Filled 2016-12-08: qty 3

## 2016-12-08 MED ORDER — INSULIN ASPART 100 UNIT/ML ~~LOC~~ SOLN
3.0000 [IU] | Freq: Three times a day (TID) | SUBCUTANEOUS | Status: DC
  Administered 2016-12-09 (×2): 3 [IU] via SUBCUTANEOUS
  Filled 2016-12-08 (×2): qty 3

## 2016-12-08 MED ORDER — FLEET ENEMA 7-19 GM/118ML RE ENEM
1.0000 | ENEMA | Freq: Once | RECTAL | Status: DC | PRN
Start: 2016-12-08 — End: 2016-12-12

## 2016-12-08 MED ORDER — BUSPIRONE HCL 10 MG PO TABS
10.0000 mg | ORAL_TABLET | Freq: Three times a day (TID) | ORAL | Status: DC
  Administered 2016-12-08 – 2016-12-12 (×12): 10 mg
  Filled 2016-12-08 (×15): qty 1

## 2016-12-08 MED ORDER — BISACODYL 10 MG RE SUPP
10.0000 mg | RECTAL | Status: DC | PRN
  Filled 2016-12-08: qty 1

## 2016-12-08 MED ORDER — VANCOMYCIN HCL IN DEXTROSE 1-5 GM/200ML-% IV SOLN
1000.0000 mg | Freq: Once | INTRAVENOUS | Status: AC
Start: 2016-12-08 — End: 2016-12-08
  Administered 2016-12-08: 1000 mg via INTRAVENOUS
  Filled 2016-12-08: qty 200

## 2016-12-08 MED ORDER — ONDANSETRON HCL 4 MG PO TABS: 4.0000 mg | ORAL_TABLET | Freq: Four times a day (QID) | ORAL | Status: DC | PRN

## 2016-12-08 MED ORDER — ALBUTEROL SULFATE (2.5 MG/3ML) 0.083% IN NEBU
2.5000 mg | INHALATION_SOLUTION | RESPIRATORY_TRACT | Status: DC
  Administered 2016-12-08 – 2016-12-09 (×4): 2.5 mg via RESPIRATORY_TRACT
  Filled 2016-12-08 (×4): qty 3

## 2016-12-08 MED ORDER — INSULIN ASPART 100 UNIT/ML ~~LOC~~ SOLN
0.0000 [IU] | Freq: Three times a day (TID) | SUBCUTANEOUS | Status: DC
  Administered 2016-12-09 (×2): 2 [IU] via SUBCUTANEOUS
  Filled 2016-12-08 (×2): qty 2

## 2016-12-08 MED ORDER — CHLORHEXIDINE GLUCONATE 0.12% ORAL RINSE (MEDLINE KIT)
15.0000 mL | Freq: Two times a day (BID) | OROMUCOSAL | Status: DC
  Administered 2016-12-08 – 2016-12-12 (×8): 15 mL via OROMUCOSAL

## 2016-12-08 MED ORDER — BACLOFEN 10 MG PO TABS
10.0000 mg | ORAL_TABLET | Freq: Three times a day (TID) | ORAL | Status: DC | PRN
  Administered 2016-12-09 – 2016-12-12 (×7): 10 mg
  Filled 2016-12-08 (×7): qty 1

## 2016-12-08 MED ORDER — LEVOFLOXACIN IN D5W 500 MG/100ML IV SOLN
500.0000 mg | INTRAVENOUS | Status: DC
  Filled 2016-12-08: qty 100

## 2016-12-08 NOTE — Progress Notes (Signed)
Pharmacy Antibiotic Note  Randy Roach is a 41 y.o. male admitted on 12/08/2016 with pneumonia.  Pharmacy has been consulted for vancomycin and cefepime dosing. Pt received cefepime 1 g IV x1 and vanocomycin 1 g IV x 1 in ED. Pt also receiving levofloxacin. Provider wishes to double cover for pseudomonas for HCAP.  MRSA PCR ordered - if negative recommend d/c vancomycin CXR: LLL infiltrate and having green sputum production Pt white count 5.5; PCT < 0.1; afebrile  Plan: Begin cefepime 2 g IV q 8 hours for pseudomonas coverage  Begin vancomycin 750 mg IV q 12 h stacked dosing. [facility] with goal of 15-20. Trough to be checked prior to 5th dose.  Ke=0.74 T1/2 = 9.36 Vd = 33  Height: 5\' 7"  (170.2 cm) Weight: 104 lb (47.2 kg) IBW/kg (Calculated) : 66.1  Temp (24hrs), Avg:97.7 F (36.5 C), Min:97.7 F (36.5 C), Max:97.7 F (36.5 C)   Recent Labs Lab 12/08/16 1359 12/08/16 1406  WBC 5.5  --   CREATININE 0.46*  --   LATICACIDVEN  --  1.3    Estimated Creatinine Clearance: 83.6 mL/min (by C-G formula based on SCr of 0.46 mg/dL (L)).    No Known Allergies  Antimicrobials this admission: cefepime 3/9 >>  levofloxacin 3/9 >>  Vancomycin 3/9 >>  Dose adjustments this admission:  Microbiology results: 3/9 BCx: sent  UCx:  3/9 Sputum: sent  3/9 MRSA PCR: sent  Thank you for allowing pharmacy to be a part of this patient's care.  Horris LatinoHolly Ernest Orr, PharmD Pharmacy Resident 12/08/2016 6:45 PM

## 2016-12-08 NOTE — ED Triage Notes (Signed)
Pt arrived from home with home care nurse who states pt has had redness and odor from trach site since Tuesday. Pt has had trach in place for approx 2 months. Pt on 5L at 100%.

## 2016-12-08 NOTE — H&P (Addendum)
[facility] NAME: Randy Roach    MR#:  [id number]  DATE OF BIRTH:  1976-06-11  DATE OF ADMISSION:  12/08/2016  PRIMARY CARE PHYSICIAN: Montel Clock, MD, MD   REQUESTING/REFERRING PHYSICIAN:   CHIEF COMPLAINT:   Chief Complaint  Patient presents with  . Possible Trach Infection    HISTORY OF PRESENT ILLNESS: Randy Roach  is a 41 y.o. male with a known history of ALS, tracheostomy, PEG tube placement, anemia, anxiety, depression, diabetes mellitus, low blood pressure, was in the hospice, however, [facility] without [facility] follow-up, who presents to the hospital with complaints of increased the tracheal drainage, being colored and foul smelling. Patient was seen by his primary care physician in about 4 days ago, at that time he had clear to 10-looking phlegm, which was cultured at [facility], family practice, per report cultures were negative. Patient's family, however, started noticing that his secretions became more colored green darker" that occur difficult to get it out. He was also noted to have some bleeding around his trach and PEG tube placement area. Family brought him into the hospital for further evaluation and treatment. Chest x-ray revealed left lower lobe pneumonia and hospitalist services were contacted for admission. No reported fevers or chills. Patient is nonverbal.The patient was noted to be hypotensive with systolic blood pressure in 80s, given IV fluid bolus.  PAST MEDICAL HISTORY:   Past Medical History:  Diagnosis Date  . ALS (amyotrophic lateral sclerosis) ([place])   . Diabetes mellitus without complication ([place])     PAST SURGICAL HISTORY: Past Surgical History:  Procedure Laterality Date  . APPENDECTOMY    . PEG TUBE PLACEMENT Left     SOCIAL HISTORY:  Social History  Substance Use Topics  . Smoking status: Former Smoker    Packs/day: 1.00    Types: Cigarettes  . Smokeless  tobacco: Never Used  . Alcohol use No     Comment: occ    FAMILY HISTORY: History reviewed. No pertinent family history.  DRUG ALLERGIES: No Known Allergies  Review of Systems  Unable to perform ROS: Patient nonverbal    MEDICATIONS AT HOME:  Prior to Admission medications   Medication Sig Start Date End Date Taking? Authorizing Provider  acetaminophen (TYLENOL) 325 MG tablet Take 2 tablets (650 mg total) by mouth every 6 (six) hours as needed for mild pain (or Fever >/= 101). 05/11/16  Yes Flora Lipps, MD  amitriptyline (ELAVIL) 25 MG tablet Take 25 mg by mouth at bedtime. Patient is taking a suspension. 12/24/15  Yes Historical Provider, MD  baclofen (LIORESAL) 10 MG tablet Place 10 mg into feeding tube every 8 (eight) hours as needed for muscle spasms.   Yes Historical Provider, MD  bisacodyl (DULCOLAX) 10 MG suppository Place 10 mg rectally as needed for moderate constipation.   Yes Historical Provider, MD  bisacodyl (FLEET) 10 MG/30ML ENEM Place 10 mg rectally once.   Yes Historical Provider, MD  busPIRone (BUSPAR) 10 MG tablet Place 10 mg into feeding tube 3 (three) times daily.   Yes Historical Provider, MD  chlorhexidine gluconate, SAGE KIT, (PERIDEX) 0.12 % solution 15 mLs by Mouth Rinse route 2 (two) times daily. 05/11/16  Yes Flora Lipps, MD  clonazePAM (KLONOPIN) 0.5 MG tablet Place 0.5 mg into feeding tube every 12 (twelve) hours as needed for anxiety.   Yes Historical Provider, MD  DULoxetine (CYMBALTA) 60 MG capsule Take 60 mg by  mouth daily. In peg tube   Yes Historical Provider, MD  enoxaparin (LOVENOX) 40 MG/0.4ML injection Inject 40 mg into the skin daily.   Yes Historical Provider, MD  Nutritional Supplements (FEEDING SUPPLEMENT, OSMOLITE 1.2 CAL,) LIQD Place 1,000 mLs into feeding tube continuous. Patient taking differently: Place 240 mLs into feeding tube every 4 (four) hours. With 200 ml of water 05/11/16  Yes Flora Lipps, MD  Oxycodone HCl 10 MG TABS Place 10 mg into  feeding tube every 4 (four) hours as needed.   Yes Historical Provider, MD  Oxycodone HCl 20 MG TABS 20 mg by PEG Tube route every 6 (six) hours as needed.   Yes Historical Provider, MD  polyethylene glycol (MIRALAX / GLYCOLAX) packet Place 17 g into feeding tube daily.   Yes Historical Provider, MD  ranitidine (ZANTAC) 150 MG tablet Take 150 mg by mouth 2 (two) times daily.   Yes Historical Provider, MD  riluzole (RILUTEK) 50 MG tablet Place 50 mg into feeding tube 2 (two) times daily.    Yes Historical Provider, MD  Selenium Sulfide 2.25 % SHAM Apply 1 application topically 2 (two) times a week. Monday and Thursday   Yes Historical Provider, MD  Skin Protectants, Misc. (EUCERIN) cream Apply 1 application topically 2 (two) times daily.   Yes Historical Provider, MD  tiZANidine (ZANAFLEX) 4 MG tablet Place 4 mg into feeding tube every 8 (eight) hours as needed for muscle spasms.   Yes Historical Provider, MD  fentaNYL 10 mcg/ml SOLN infusion Inject 25-400 mcg/hr into the vein continuous. Patient not taking: Reported on 12/08/2016 05/11/16   Flora Lipps, MD  HYDROcodone-acetaminophen (NORCO) 10-325 MG tablet Take 1 tablet by mouth every 4 (four) hours as needed.    Historical Provider, MD  ipratropium-albuterol (DUONEB) 0.5-2.5 (3) MG/3ML SOLN Take 3 mLs by nebulization every 4 (four) hours as needed. Patient not taking: Reported on 12/08/2016 05/11/16   Flora Lipps, MD  piperacillin-tazobactam (ZOSYN) 3.375 GM/50ML IVPB Inject 50 mLs (3.375 g total) into the vein every 8 (eight) hours. Patient not taking: Reported on 12/08/2016 05/11/16   Flora Lipps, MD  predniSONE (STERAPRED UNI-PAK 21 TAB) 10 MG (21) TBPK tablet Take 10 mg by mouth daily. day 1 - 2 before breakfast, 1 after lunch, 1 after supper, and 2 at bedtime, day 2 - 1 before breakfast, 1 after lunch, 1 after supper, and 2 at bedtime, day 3 - 1 before breakfast, 1 after lunch, 1 after supper, and 1 at bedtime, day 4 - 1 before breakfast, 1 after  lunch, and 1 at bedtime, day 5 - 1 before breakfast, and 1 at bedtime, day 6 - 1 before breakfast    Historical Provider, MD      PHYSICAL EXAMINATION:   VITAL SIGNS: Blood pressure 93/65, pulse 80, temperature 97.7 F (36.5 C), temperature source Axillary, resp. rate 18, weight 47.2 kg (104 lb), SpO2 100 %.  GENERAL:  41 y.o.-year-old patient lying in the bed with no acute distress, Somnolent laying on the stretcher. Very pale, diaphoretic, uncomfortable.  EYES: Pupils equal, round, reactive to light and accommodation. No scleral icterus. Extraocular muscles intact.  HEENT: Head atraumatic, normocephalic. Oropharynx and nasopharynx clear. Tracheostomy site is moist, dressing is pink but no active bleeding was noted NECK:  Supple, no jugular venous distention. No thyroid enlargement, no tenderness.  LUNGS: Normal breath sounds bilaterally, no wheezing, rales,rhonchi , but faint crepitations noted in left lower lung zone was thoroughly. No use of accessory muscles of respiration.  Patient is on the vent, tidal volumes are good at 500 cc, SIMV mode CARDIOVASCULAR: S1, S2 normal. No murmurs, rubs, or gallops.  ABDOMEN: Soft, nontender, nondistended. Bowel sounds present. No organomegaly or mass. PEG tube in the middle of abdomen dressing is clean EXTREMITIES: No pedal edema, cyanosis, or clubbing.  NEUROLOGIC: Cranial nerves II through XII are intact. Muscle strength , unable to evaluate due to significant weakness. Sensation unable to evaluate this patient is nonverbal. Gait not checked.  PSYCHIATRIC: The patient is somnolent, opens his eyes, not able to converse. Nonverbal, unable to assess his orientation at this time.  SKIN: No obvious rash, lesion, or ulcer.   LABORATORY PANEL:   CBC  Recent Labs Lab 12/08/16 1359  WBC 5.5  HGB 10.5*  HCT 30.1*  PLT 409  MCV 87.3  MCH 30.5  MCHC 35.0  RDW 12.7  LYMPHSABS 1.3  MONOABS 0.5  EOSABS 0.2  BASOSABS 0.0    ------------------------------------------------------------------------------------------------------------------  Chemistries   Recent Labs Lab 12/08/16 1359  NA 138  K 4.1  CL 102  CO2 28  GLUCOSE 176*  BUN 14  CREATININE 0.46*  CALCIUM 9.2  AST 32  ALT 38  ALKPHOS 108  BILITOT 0.1*   ------------------------------------------------------------------------------------------------------------------  Cardiac Enzymes  Recent Labs Lab 12/08/16 1359  TROPONINI <0.03   ------------------------------------------------------------------------------------------------------------------  RADIOLOGY: Dg Chest Port 1 View  Result Date: 12/08/2016 CLINICAL DATA:  Sepsis EXAM: PORTABLE CHEST 1 VIEW COMPARISON:  05/10/2016 FINDINGS: Left lower lobe infiltrate and small left effusion. Right lung clear. Tracheostomy tip just above the carina. IMPRESSION: Left lower lobe consolidation may represent pneumonia or atelectasis. Small left effusion. Electronically Signed   By: Franchot Gallo M.D.   On: 12/08/2016 14:15    EKG: Orders placed or performed during the hospital encounter of 05/08/16  . ED EKG 12-Lead  . ED EKG 12-Lead  . EKG 12-Lead  . EKG 12-Lead    IMPRESSION AND PLAN:  Active Problems:   Left lower lobe pneumonia (HCC)   Hyperglycemia   Anemia  #1. Left lower lobe pneumonia, admitted patient medical floor, get pulmonary consultation, get sputum cultures, initiate patient on broad-spectrum antibiotic therapy with vancomycin, cefepime and levofloxacin, follow closely. Get palliative care involved for further recommendations, patient may benefit from hospice follow-up as outpatient #2. Diabetes mellitus type 2 with hyperglycemia, sliding scale insulin #3. Anemia, questionable related to oozing from PEG and trach sites , Patient was apparently on high doses of Lovenox for likely DVT prophylaxis, decrease Lovenox dose to 30 mg daily, watch hemoglobin level and transfuse  patient as needed #4 ALS, patient was deemed to be hospice appropriate in June 2017 by [facility], continue pulmonary care, supportive therapy, palliative care consultation, likely hospice follow-up as outpatient #5 hypotension, continue patient on IV fluids tomorrow, dietary consultation is requested for diet adjustment. Follow closely, could be related to infectious process. Follow hemoglobin level with IV fluid administration, may need to be transfused  All the records are reviewed and case discussed with ED provider. Management plans discussed with the patient, family and they are in agreement.  CODE STATUS: Code Status History    Date Active Date Inactive Code Status Order ID Comments User Context   05/10/2016  1:23 PM 05/11/2016  6:25 PM Partial Code [id number]  Flora Lipps, MD Inpatient   05/09/2016 12:19 AM 05/10/2016  1:23 PM Full Code [id number]  Harvie Bridge, DO Inpatient   11/26/2015  3:15 PM 11/27/2015  3:32 AM Full Code [id number]  York Cerise  Earleen Newport, DO HOV    Questions for Most Recent Historical Code Status (Order [id number])    Question Answer Comment   In the event of cardiac or respiratory ARREST: Initiate Code Blue, Call Rapid Response No    In the event of cardiac or respiratory ARREST: Perform CPR No    In the event of cardiac or respiratory ARREST: Perform Intubation/Mechanical Ventilation Yes    In the event of cardiac or respiratory ARREST: Use NIPPV/BiPAp only if indicated Yes    In the event of cardiac or respiratory ARREST: Administer ACLS medications if indicated No    In the event of cardiac or respiratory ARREST: Perform Defibrillation or Cardioversion if indicated No        TOTAL TIME TAKING CARE OF THIS PATIENT: 60  minutes.    Theodoro Grist M.D on 12/08/2016 at 4:42 PM  Between 7am to 6pm - Pager - [phone] After 6pm go to [email or web address] - password [email or web address] [facility]  Office  [phone]  CC: Primary care physician; Montel Clock, MD, MD

## 2016-12-08 NOTE — Progress Notes (Signed)
Palliative Medicine Team  Due to high volume of referrals, there is a delay seeing this patient. [facility] not [facility] over the weekend but will arrange goals of care with patient and family next week. Thank you for the opportunity to participate in the care of Mr. Janee Mornhompson.  Vennie HomansMegan Calix Heinbaugh, FNP-C Palliative Medicine Team  Phone: [phone] Fax: [phone]

## 2016-12-08 NOTE — Consult Note (Signed)
Name: Randy Roach MRN: [id number] DOB: 09/22/1976    ADMISSION DATE:  12/08/2016 CONSULTATION DATE: 12/08/2016  REFERRING MD :  Dr. Ether Griffins  CHIEF COMPLAINT:  Possible Trach Infection  BRIEF PATIENT DESCRIPTION:  41 yo male with hx of ALS and chronic tracheostomy vent dependent presented to [facility] due to concerns of tracheostomy infection due to increased foul smelling tracheostomy drainage CXR in ER revealed ?LLL infiltrate concerning for pneumonia vs. atelectasis with hypotension requiring fluid resuscitation   SIGNIFICANT EVENTS  03/9-Pt admitted to ICU with acute on chronic respiratory failure secondary to ?LLL infiltrate concerning for pneumonia vs. atelectasis with hypotension requiring fluid resuscitation PCCM consulted for additional management pt on home ventilator   STUDIES:  None   HISTORY OF PRESENT ILLNESS:   This is a 41 yo male with a PMH of Diabetes Mellitus, ALS (diagnosed 09/2015), Significant family hx of ALS his father and paternal uncle were diagnosed in their late December 20, 2022 and died from the disease in their 2022-12-20, Chronic tracheostomy ventilator dependent, PEG tube (placed 09/2015), Anemia, Anxiety, and Depression.  Per ER notes pt was deemed hospice appropriate in June 2017 by [facility] due to progressive worsening of his ALS, however per PCP's most recent progress note on 12/04/16  pts family is not open to hospice services at this time. He presented to [facility] ER 03/9 due to family concerns of possible tracheostomy infection due to increased foul smelling tracheostomy drainage. Per ER notes the pt was seen by his PCP on 03/5 due to increased secretions, at that time the secretions were clear and PCP obtained sputum cultures results were negative.  However, due to secretions developing a foul odor, becoming dark green in color, and bleeding around trach and PEG tube site this prompted visit to the ER for further evaluation.  In the ER CXR revealed ?left lower lobe infiltrate  concerning for pneumonia vs. atelectasis with hypotension requiring fluid resuscitation he has remained afebrile, and wbc normal.  Therefore, pt admitted to the ICU due to acute on chronic respiratory failure with chronic vent dependence and PCCM consulted for additional management.    PAST MEDICAL HISTORY :   has a past medical history of ALS (amyotrophic lateral sclerosis) ([place]) and Diabetes mellitus without complication ([place]).  has a past surgical history that includes Appendectomy and PEG tube placement (Left). Prior to Admission medications   Medication Sig Start Date End Date Taking? Authorizing Provider  acetaminophen (TYLENOL) 325 MG tablet Take 2 tablets (650 mg total) by mouth every 6 (six) hours as needed for mild pain (or Fever >/= 101). 05/11/16  Yes Flora Lipps, MD  amitriptyline (ELAVIL) 25 MG tablet Take 25 mg by mouth at bedtime. Patient is taking a suspension. 12/24/15  Yes Historical Provider, MD  baclofen (LIORESAL) 10 MG tablet Place 10 mg into feeding tube every 8 (eight) hours as needed for muscle spasms.   Yes Historical Provider, MD  bisacodyl (DULCOLAX) 10 MG suppository Place 10 mg rectally as needed for moderate constipation.   Yes Historical Provider, MD  bisacodyl (FLEET) 10 MG/30ML ENEM Place 10 mg rectally once.   Yes Historical Provider, MD  busPIRone (BUSPAR) 10 MG tablet Place 10 mg into feeding tube 3 (three) times daily.   Yes Historical Provider, MD  chlorhexidine gluconate, SAGE KIT, (PERIDEX) 0.12 % solution 15 mLs by Mouth Rinse route 2 (two) times daily. 05/11/16  Yes Flora Lipps, MD  clonazePAM (KLONOPIN) 0.5 MG tablet Place 0.5 mg into feeding tube every 12 (twelve)  hours as needed for anxiety.   Yes Historical Provider, MD  DULoxetine (CYMBALTA) 60 MG capsule Take 60 mg by mouth daily. In peg tube   Yes Historical Provider, MD  enoxaparin (LOVENOX) 40 MG/0.4ML injection Inject 40 mg into the skin daily.   Yes Historical Provider, MD  Nutritional Supplements  (FEEDING SUPPLEMENT, OSMOLITE 1.2 CAL,) LIQD Place 1,000 mLs into feeding tube continuous. Patient taking differently: Place 240 mLs into feeding tube every 4 (four) hours. With 200 ml of water 05/11/16  Yes Flora Lipps, MD  Oxycodone HCl 10 MG TABS Place 10 mg into feeding tube every 4 (four) hours as needed.   Yes Historical Provider, MD  Oxycodone HCl 20 MG TABS 20 mg by PEG Tube route every 6 (six) hours as needed.   Yes Historical Provider, MD  polyethylene glycol (MIRALAX / GLYCOLAX) packet Place 17 g into feeding tube daily.   Yes Historical Provider, MD  ranitidine (ZANTAC) 150 MG tablet Take 150 mg by mouth 2 (two) times daily.   Yes Historical Provider, MD  riluzole (RILUTEK) 50 MG tablet Place 50 mg into feeding tube 2 (two) times daily.    Yes Historical Provider, MD  Selenium Sulfide 2.25 % SHAM Apply 1 application topically 2 (two) times a week. Monday and Thursday   Yes Historical Provider, MD  Skin Protectants, Misc. (EUCERIN) cream Apply 1 application topically 2 (two) times daily.   Yes Historical Provider, MD  tiZANidine (ZANAFLEX) 4 MG tablet Place 4 mg into feeding tube every 8 (eight) hours as needed for muscle spasms.   Yes Historical Provider, MD  fentaNYL 10 mcg/ml SOLN infusion Inject 25-400 mcg/hr into the vein continuous. Patient not taking: Reported on 12/08/2016 05/11/16   Flora Lipps, MD  HYDROcodone-acetaminophen (NORCO) 10-325 MG tablet Take 1 tablet by mouth every 4 (four) hours as needed.    Historical Provider, MD  ipratropium-albuterol (DUONEB) 0.5-2.5 (3) MG/3ML SOLN Take 3 mLs by nebulization every 4 (four) hours as needed. Patient not taking: Reported on 12/08/2016 05/11/16   Flora Lipps, MD  piperacillin-tazobactam (ZOSYN) 3.375 GM/50ML IVPB Inject 50 mLs (3.375 g total) into the vein every 8 (eight) hours. Patient not taking: Reported on 12/08/2016 05/11/16   Flora Lipps, MD  predniSONE (STERAPRED UNI-PAK 21 TAB) 10 MG (21) TBPK tablet Take 10 mg by mouth daily. day 1  - 2 before breakfast, 1 after lunch, 1 after supper, and 2 at bedtime, day 2 - 1 before breakfast, 1 after lunch, 1 after supper, and 2 at bedtime, day 3 - 1 before breakfast, 1 after lunch, 1 after supper, and 1 at bedtime, day 4 - 1 before breakfast, 1 after lunch, and 1 at bedtime, day 5 - 1 before breakfast, and 1 at bedtime, day 6 - 1 before breakfast    Historical Provider, MD   No Known Allergies  FAMILY HISTORY:  family history is not on file. SOCIAL HISTORY:  reports that he has quit smoking. His smoking use included Cigarettes. He smoked 1.00 pack per day. He has never used smokeless tobacco. He reports that he does not drink alcohol or use drugs.  REVIEW OF SYSTEMS:  Positives in BOLD Constitutional: Negative for fever, chills, weight loss, malaise/fatigue and diaphoresis.  HENT: Negative for hearing loss, ear pain, nosebleeds, congestion, sore throat, neck pain, tinnitus and ear discharge.   Eyes: Negative for blurred vision, double vision, photophobia, pain, discharge and redness.  Respiratory: cough, hemoptysis, sputum production and bleeding around tracheostomy site, shortness of  breath, wheezing and stridor.   Cardiovascular: Negative for chest pain, palpitations, orthopnea, claudication, leg swelling and PND.  Gastrointestinal: bleeding around PEG tube site, heartburn, nausea, vomiting, abdominal pain, diarrhea, constipation, blood in stool and melena.  Genitourinary: Negative for dysuria, urgency, frequency, hematuria and flank pain.  Musculoskeletal: Negative for myalgias, back pain, joint pain and falls.  Skin: Negative for itching and rash.  Neurological: Negative for dizziness, tingling, tremors, sensory change, speech change, focal weakness, seizures, loss of consciousness, weakness and headaches.  Endo/Heme/Allergies: Negative for environmental allergies and polydipsia. Does not bruise/bleed easily.  SUBJECTIVE:  Pt resting in bed in no acute distress on home  ventilator   VITAL SIGNS: Temp:  [97.7 F (36.5 C)] 97.7 F (36.5 C) (03/09 1346) Pulse Rate:  [80-98] 80 (03/09 1555) Resp:  [18] 18 (03/09 1555) BP: (93-105)/(65-74) 93/65 (03/09 1555) SpO2:  [100 %] 100 % (03/09 1555) Weight:  [47.2 kg (104 lb)] 47.2 kg (104 lb) (03/09 1348)  PHYSICAL EXAMINATION: General: chronically ill appearing Caucasian male Neuro: alert, unable to move bilateral upper extremities due to ALS, however he is able to move bilateral lower extremities on command, PERRLA HEENT: supple, no JVD Cardiovascular: nsr, s1s2, no M/R/G Lungs: rhonchi throughout, even, non labored on home ventilator, size 7 xlt midline tracheostomy Abdomen: +BS x4, soft, non tender, non distended Musculoskeletal: bilateral foot drop, unable to move upper extremities bilateral upper extremity braces in place Skin: intact no rashes or lesions   Recent Labs Lab 12/08/16 1359  NA 138  K 4.1  CL 102  CO2 28  BUN 14  CREATININE 0.46*  GLUCOSE 176*    Recent Labs Lab 12/08/16 1359  HGB 10.5*  HCT 30.1*  WBC 5.5  PLT 409   Dg Chest Port 1 View  Result Date: 12/08/2016 CLINICAL DATA:  Sepsis EXAM: PORTABLE CHEST 1 VIEW COMPARISON:  05/10/2016 FINDINGS: Left lower lobe infiltrate and small left effusion. Right lung clear. Tracheostomy tip just above the carina. IMPRESSION: Left lower lobe consolidation may represent pneumonia or atelectasis. Small left effusion. Electronically Signed   By: Franchot Gallo M.D.   On: 12/08/2016 14:15    ASSESSMENT / PLAN: Acute on chronic respiratory failure secondary to ALS and ?LLL Pneumonia (CXR 03/9 revealed ?LLL infiltrate vs. Atelectasis) Chronic ventilator dependence Hypotension Mild Anemia  Hx: Chronic hypotension (baseline systolic 47-425'Z) P: Continue home ventilator  Maintain O2 sats >92% Prn bronchodilator therapy Continue empiric cefepime, levaquin, and vancomycin for now Follow sputum and blood cultures  Trend WBC's and monitor  fever curve Trend PCT's  Tracheostomy care per protocol  Maintain map >60 with fluid resuscitation  Repeat cxr in am SCD's for VTE prophylaxis  Trend CBC Monitor for s/sx of bleeding Family requesting GI consultation for PEG tube to be changed due to intermittent clogging of the tube  Marda Stalker, [place] Pager [phone] (please enter 7 digits) PCCM Consult Pager [phone] (please enter 7 digits)    PCCM ATTENDING ATTESTATION:  I have evaluated patient with the APP [place], reviewed database in its entirety and discussed care plan in detail.   Severe ALS with chronic trach, G tube. Now with LLL PNA   Important exam findings: Cognition intact No overt distress noted Bilateral rhonchi Tachycardia with regular rhythm, No murmurs  abdomen soft, G-tube present Extremities warm  Major problems addressed by PCCM team: Advanced ALS Chronic ventilator dependence Left lower lobe pneumonia   PLAN/REC: Ventilator settings and status reviewed Continue cefepime, discontinue vancomycin (no  gram-positive clusters noted on respiratory culture) Airway hygiene with frequent suctioning as needed Follow-up chest x-ray Initiate tube feeding protocol Change sliding scale insulin to every 6 hours, sensitive scale   Merton Border, MD PCCM service Mobile [phone] Pager [phone] 12/09/2016

## 2016-12-08 NOTE — ED Provider Notes (Signed)
[facility] Emergency Department Provider Note  ____________________________________________   None    (approximate)  I have reviewed the triage vital signs and the nursing notes.   HISTORY  Chief Complaint Possible Trach Infection    HPI Randy Roach is a 41 y.o. male who comes to the emergency department for possible tracheostomy infection. 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 According to the home health nurse the patient's symptoms began around 3 days ago with increasing sputum that is foul-smelling coming from the trachea. He was seen at the [place] and cultures were done but if not come back yet. The patient has been afebrile at home. She has noted some diminished lung sounds on the left. The tracheostomy has been in place since August of last year which is roughly 7 months. The nurses also noted a small amount of bleeding from around the tracheostomy site. The past several days.     Past Medical History:  Diagnosis Date  . 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 Diabetes mellitus without complication [facility])     Patient Active Problem List   Diagnosis Date Noted  . Left lower lobe pneumonia ([place]) 12/08/2016  . Hyperglycemia 12/08/2016  . Anemia 12/08/2016  . Sepsis due to pneumonia ([place]) 05/08/2016    Past Surgical History:  Procedure Laterality Date  . APPENDECTOMY    . PEG TUBE PLACEMENT Left     Prior to Admission medications   Medication Sig Start Date End Date Taking? Authorizing Provider  acetaminophen (TYLENOL) 325 MG tablet Take 2 tablets (650 mg total) by mouth every 6 (six) hours as needed for mild pain (or Fever >/= 101). 05/11/16  Yes Flora Lipps, MD  amitriptyline (ELAVIL) 25 MG tablet Take 25 mg by mouth at bedtime. Patient is taking a suspension. 12/24/15  Yes Historical Provider, MD  baclofen (LIORESAL)  10 MG tablet Place 10 mg into feeding tube every 8 (eight) hours as needed for muscle spasms.   Yes Historical Provider, MD  bisacodyl (DULCOLAX) 10 MG suppository Place 10 mg rectally as needed for moderate constipation.   Yes Historical Provider, MD  bisacodyl (FLEET) 10 MG/30ML ENEM Place 10 mg rectally once.   Yes Historical Provider, MD  busPIRone (BUSPAR) 10 MG tablet Place 10 mg into feeding tube 3 (three) times daily.   Yes Historical Provider, MD  chlorhexidine gluconate, SAGE KIT, (PERIDEX) 0.12 % solution 15 mLs by Mouth Rinse route 2 (two) times daily. 05/11/16  Yes Flora Lipps, MD  clonazePAM (KLONOPIN) 0.5 MG tablet Place 0.5 mg into feeding tube every 12 (twelve) hours as needed for anxiety.   Yes Historical Provider, MD  DULoxetine (CYMBALTA) 60 MG capsule Take 60 mg by mouth daily. In peg tube   Yes Historical Provider, MD  enoxaparin (LOVENOX) 40 MG/0.4ML injection Inject 40 mg into the skin daily.   Yes Historical Provider, MD  Nutritional Supplements (FEEDING SUPPLEMENT, OSMOLITE 1.2 CAL,) LIQD Place 1,000 mLs into feeding tube continuous. Patient taking differently: Place 240 mLs into feeding tube every 4 (four) hours. With 200 ml of water 05/11/16  Yes Flora Lipps, MD  Oxycodone HCl 10 MG TABS Place 10 mg into feeding tube every 4 (four) hours as needed.   Yes Historical Provider, MD  Oxycodone HCl 20 MG TABS 20 mg by PEG Tube route every 6 (six) hours as needed.  Yes Historical Provider, MD  polyethylene glycol (MIRALAX / GLYCOLAX) packet Place 17 g into feeding tube daily.   Yes Historical Provider, MD  ranitidine (ZANTAC) 150 MG tablet Take 150 mg by mouth 2 (two) times daily.   Yes Historical Provider, MD  riluzole (RILUTEK) 50 MG tablet Place 50 mg into feeding tube 2 (two) times daily.    Yes Historical Provider, MD  Selenium Sulfide 2.25 % SHAM Apply 1 application topically 2 (two) times a week. Monday and Thursday   Yes Historical Provider, MD  Skin Protectants, Misc.  (EUCERIN) cream Apply 1 application topically 2 (two) times daily.   Yes Historical Provider, MD  tiZANidine (ZANAFLEX) 4 MG tablet Place 4 mg into feeding tube every 8 (eight) hours as needed for muscle spasms.   Yes Historical Provider, MD  fentaNYL 10 mcg/ml SOLN infusion Inject 25-400 mcg/hr into the vein continuous. Patient not taking: Reported on 12/08/2016 05/11/16   Flora Lipps, MD  HYDROcodone-acetaminophen (NORCO) 10-325 MG tablet Take 1 tablet by mouth every 4 (four) hours as needed.    Historical Provider, MD  ipratropium-albuterol (DUONEB) 0.5-2.5 (3) MG/3ML SOLN Take 3 mLs by nebulization every 4 (four) hours as needed. Patient not taking: Reported on 12/08/2016 05/11/16   Flora Lipps, MD  piperacillin-tazobactam (ZOSYN) 3.375 GM/50ML IVPB Inject 50 mLs (3.375 g total) into the vein every 8 (eight) hours. Patient not taking: Reported on 12/08/2016 05/11/16   Flora Lipps, MD  predniSONE (STERAPRED UNI-PAK 21 TAB) 10 MG (21) TBPK tablet Take 10 mg by mouth daily. day 1 - 2 before breakfast, 1 after lunch, 1 after supper, and 2 at bedtime, day 2 - 1 before breakfast, 1 after lunch, 1 after supper, and 2 at bedtime, day 3 - 1 before breakfast, 1 after lunch, 1 after supper, and 1 at bedtime, day 4 - 1 before breakfast, 1 after lunch, and 1 at bedtime, day 5 - 1 before breakfast, and 1 at bedtime, day 6 - 1 before breakfast    Historical Provider, MD    Allergies Patient has no known allergies.  History reviewed. No pertinent family history.  Social History Social History  Substance Use Topics  . Smoking status: Former Smoker    Packs/day: 1.00    Types: Cigarettes  . Smokeless tobacco: Never Used  . Alcohol use No     Comment: occ    Review of Systems Constitutional: No fever/chills Eyes: No visual changes. ENT: No sore throat. Cardiovascular: Denies chest pain. Respiratory: Denies shortness of breath. Gastrointestinal: No abdominal pain.  No nausea, no vomiting.  No diarrhea.   No constipation. Genitourinary: Negative for dysuria. Musculoskeletal: Negative for back pain. Skin: Negative for rash. Neurological: Negative for headaches, focal weakness or numbness.  10-point ROS otherwise negative.  ____________________________________________   PHYSICAL EXAM:  VITAL SIGNS: ED Triage Vitals [12/08/16 1340]  Enc Vitals Group     BP      Pulse      Resp      Temp      Temp src      SpO2 100 %     Weight      Height      Head Circumference      Peak Flow      Pain Score      Pain Loc      Pain Edu?      Excl. in [place]?     Constitutional: Diaphoretic nonverbal responsive to questionsyes: PERRL EOMI. Head: Atraumatic. Nose:  No congestion/rhinnorhea. Mouth/Throat: No trismus Neck:  trach in place with foul smelling thick discharge and slight bleeding around the stoma Cardiovascular:  tachycardicate, regular rhythm. Grossly normal heart sounds.  Good peripheral circulation. Respiratory:  increasedespiratory effort.  No retractions.  decreased breath sounds on left astrointestinal: Soft nondistended nontender no rebound no guarding no peritonitis no McBurney's tenderness negative Rovsing's no costovertebral tenderness negative Murphy's Musculoskeletal: No lower extremity edema   Neurologic:   Able to move his head but not his extremities Skin:   Face is diaphoretic   ____________________________________________   LABS (all labs ordered are listed, but only abnormal results are displayed)  Labs Reviewed  COMPREHENSIVE METABOLIC PANEL - Abnormal; Notable for the following:       Result Value   Glucose, Bld 176 (*)    Creatinine, Ser 0.46 (*)    Total Bilirubin 0.1 (*)    All other components within normal limits  CBC WITH DIFFERENTIAL/PLATELET - Abnormal; Notable for the following:    RBC 3.44 (*)    Hemoglobin 10.5 (*)    HCT 30.1 (*)    All other components within normal limits  URINALYSIS, ROUTINE W REFLEX MICROSCOPIC - Abnormal; Notable for  the following:    Color, Urine YELLOW (*)    APPearance CLEAR (*)    Specific Gravity, Urine 1.004 (*)    All other components within normal limits  GLUCOSE, CAPILLARY - Abnormal; Notable for the following:    Glucose-Capillary 133 (*)    All other components within normal limits  CULTURE, EXPECTORATED SPUTUM-ASSESSMENT  CULTURE, RESPIRATORY (NON-EXPECTORATED)  MRSA PCR SCREENING  CULTURE, BLOOD (ROUTINE X 2)  CULTURE, BLOOD (ROUTINE X 2)  CULTURE, EXPECTORATED SPUTUM-ASSESSMENT  LACTIC ACID, PLASMA  LIPASE, BLOOD  TROPONIN I  PROCALCITONIN  PROTIME-INR  HEMOGLOBIN A1C  HIV ANTIBODY (ROUTINE TESTING)  BASIC METABOLIC PANEL  CBC  MAGNESIUM  PHOSPHORUS  PROCALCITONIN   ____________________________________________  EKG   ____________________________________________  RADIOLOGY  Chest x-ray shows left-sided pneumonia ____________________________________________   PROCEDURES  Procedure(s) performed: no  Procedures  Critical Care performed: no  ____________________________________________   INITIAL IMPRESSION / ASSESSMENT AND PLAN / ED COURSE  Pertinent labs & imaging results that were available during my care of the patient were reviewed by me and considered in my medical decision making (see chart for details).  The patient arrives with Decreased breath sounds on the left side, foul-smelling discharge from his trachea, and tachycardic. He certainly has tracheitis and may have pneumonia. Levaquin for now given the pseudomonal infection in his trachea and fluids and reevaluation.  Chest x-ray shows left-sided pneumonia. This is his hospital associated he will require inpatient admission. I am broadening his antibiotics to cefepime and vancomycin as well.   ____________________________________________   FINAL CLINICAL IMPRESSION(S) / ED DIAGNOSES  Final diagnoses:  Tracheitis  Pneumonia of left lower lobe due to infectious organism [facility])      NEW  MEDICATIONS STARTED DURING THIS VISIT:  Current Discharge Medication List       Note:  This document was prepared using Dragon voice recognition software and may include unintentional dictation errors.     Darel Hong, MD 12/08/16 2107

## 2016-12-09 LAB — CBC
HCT: 24.7 % — ABNORMAL LOW (ref 40.0–52.0)
HEMOGLOBIN: 8.4 g/dL — AB (ref 13.0–18.0)
MCH: 29.5 pg (ref 26.0–34.0)
MCHC: 34.2 g/dL (ref 32.0–36.0)
MCV: 86.5 fL (ref 80.0–100.0)
Platelets: 342 10*3/uL (ref 150–440)
RBC: 2.85 MIL/uL — ABNORMAL LOW (ref 4.40–5.90)
RDW: 12.8 % (ref 11.5–14.5)
WBC: 11.3 10*3/uL — ABNORMAL HIGH (ref 3.8–10.6)

## 2016-12-09 LAB — BLOOD CULTURE ID PANEL (REFLEXED)
Acinetobacter baumannii: NOT DETECTED
CANDIDA GLABRATA: NOT DETECTED
Candida albicans: NOT DETECTED
Candida krusei: NOT DETECTED
Candida parapsilosis: NOT DETECTED
Candida tropicalis: NOT DETECTED
ENTEROBACTER CLOACAE COMPLEX: NOT DETECTED
ENTEROCOCCUS SPECIES: NOT DETECTED
Enterobacteriaceae species: NOT DETECTED
Escherichia coli: NOT DETECTED
Haemophilus influenzae: NOT DETECTED
Klebsiella oxytoca: NOT DETECTED
Klebsiella pneumoniae: NOT DETECTED
LISTERIA MONOCYTOGENES: NOT DETECTED
Methicillin resistance: DETECTED — AB
NEISSERIA MENINGITIDIS: NOT DETECTED
PROTEUS SPECIES: NOT DETECTED
Pseudomonas aeruginosa: NOT DETECTED
STREPTOCOCCUS AGALACTIAE: NOT DETECTED
STREPTOCOCCUS SPECIES: NOT DETECTED
Serratia marcescens: NOT DETECTED
Staphylococcus aureus (BCID): NOT DETECTED
Staphylococcus species: DETECTED — AB
Streptococcus pneumoniae: NOT DETECTED
Streptococcus pyogenes: NOT DETECTED

## 2016-12-09 LAB — PHOSPHORUS: PHOSPHORUS: 3.5 mg/dL (ref 2.5–4.6)

## 2016-12-09 LAB — BASIC METABOLIC PANEL
ANION GAP: 6 (ref 5–15)
BUN: 10 mg/dL (ref 6–20)
CALCIUM: 8.9 mg/dL (ref 8.9–10.3)
CO2: 27 mmol/L (ref 22–32)
Chloride: 103 mmol/L (ref 101–111)
Creatinine, Ser: <0.3 mg/dL — ABNORMAL LOW (ref 0.61–1.24)
GLUCOSE: 115 mg/dL — AB (ref 65–99)
Potassium: 3.6 mmol/L (ref 3.5–5.1)
SODIUM: 136 mmol/L (ref 135–145)

## 2016-12-09 LAB — GLUCOSE, CAPILLARY
GLUCOSE-CAPILLARY: 188 mg/dL — AB (ref 65–99)
GLUCOSE-CAPILLARY: 189 mg/dL — AB (ref 65–99)
GLUCOSE-CAPILLARY: 203 mg/dL — AB (ref 65–99)
Glucose-Capillary: 95 mg/dL (ref 65–99)

## 2016-12-09 LAB — PROCALCITONIN: Procalcitonin: <0.1 ng/mL

## 2016-12-09 LAB — MAGNESIUM: MAGNESIUM: 1.5 mg/dL — AB (ref 1.7–2.4)

## 2016-12-09 MED ORDER — INSULIN ASPART 100 UNIT/ML ~~LOC~~ SOLN
0.0000 [IU] | Freq: Four times a day (QID) | SUBCUTANEOUS | Status: DC
  Administered 2016-12-09: 3 [IU] via SUBCUTANEOUS
  Administered 2016-12-10 – 2016-12-11 (×2): 2 [IU] via SUBCUTANEOUS
  Administered 2016-12-12 (×2): 1 [IU] via SUBCUTANEOUS
  Filled 2016-12-09: qty 1
  Filled 2016-12-09: qty 3
  Filled 2016-12-09 (×2): qty 2
  Filled 2016-12-09: qty 1
  Filled 2016-12-09: qty 3

## 2016-12-09 MED ORDER — INSULIN ASPART 100 UNIT/ML ~~LOC~~ SOLN: 0.0000 [IU] | SUBCUTANEOUS | Status: DC

## 2016-12-09 MED ORDER — ALBUTEROL SULFATE (2.5 MG/3ML) 0.083% IN NEBU: 2.5000 mg | INHALATION_SOLUTION | Freq: Four times a day (QID) | RESPIRATORY_TRACT | Status: DC

## 2016-12-09 MED ORDER — JEVITY 1.2 CAL PO LIQD: 1000.0000 mL | ORAL | Status: DC

## 2016-12-09 MED ORDER — FREE WATER
100.0000 mL | Freq: Three times a day (TID) | Status: DC
  Administered 2016-12-09 – 2016-12-10 (×2): 100 mL

## 2016-12-09 MED ORDER — INSULIN ASPART 100 UNIT/ML ~~LOC~~ SOLN: 0.0000 [IU] | Freq: Four times a day (QID) | SUBCUTANEOUS | Status: DC

## 2016-12-09 MED ORDER — MAGNESIUM SULFATE 2 GM/50ML IV SOLN
2.0000 g | Freq: Once | INTRAVENOUS | Status: AC
  Administered 2016-12-09: 2 g via INTRAVENOUS
  Filled 2016-12-09: qty 50

## 2016-12-09 MED ORDER — ALBUTEROL SULFATE (2.5 MG/3ML) 0.083% IN NEBU
2.5000 mg | INHALATION_SOLUTION | Freq: Three times a day (TID) | RESPIRATORY_TRACT | Status: DC
  Administered 2016-12-09 – 2016-12-12 (×10): 2.5 mg via RESPIRATORY_TRACT
  Filled 2016-12-09 (×11): qty 3

## 2016-12-09 MED ORDER — VANCOMYCIN HCL IN DEXTROSE 750-5 MG/150ML-% IV SOLN
750.0000 mg | Freq: Once | INTRAVENOUS | Status: AC
  Administered 2016-12-09: 750 mg via INTRAVENOUS
  Filled 2016-12-09: qty 150

## 2016-12-09 MED ORDER — PNEUMOCOCCAL VAC POLYVALENT 25 MCG/0.5ML IJ INJ: 0.5000 mL | INJECTION | INTRAMUSCULAR | Status: DC

## 2016-12-09 MED ORDER — ENOXAPARIN SODIUM 40 MG/0.4ML ~~LOC~~ SOLN
40.0000 mg | SUBCUTANEOUS | Status: DC
Start: 2016-12-09 — End: 2016-12-12
  Administered 2016-12-09 – 2016-12-12 (×4): 40 mg via SUBCUTANEOUS
  Filled 2016-12-09 (×4): qty 0.4

## 2016-12-09 MED ORDER — VANCOMYCIN HCL IN DEXTROSE 750-5 MG/150ML-% IV SOLN
750.0000 mg | Freq: Three times a day (TID) | INTRAVENOUS | Status: DC
  Administered 2016-12-10 – 2016-12-11 (×5): 750 mg via INTRAVENOUS
  Filled 2016-12-09 (×7): qty 150

## 2016-12-09 MED ORDER — ORAL CARE MOUTH RINSE
15.0000 mL | OROMUCOSAL | Status: DC
  Administered 2016-12-09 – 2016-12-12 (×34): 15 mL via OROMUCOSAL

## 2016-12-09 MED ORDER — SODIUM CHLORIDE 0.9 % IV BOLUS (SEPSIS)
500.0000 mL | Freq: Once | INTRAVENOUS | Status: AC
  Administered 2016-12-09: 500 mL via INTRAVENOUS

## 2016-12-09 MED ORDER — FREE WATER
200.0000 mL | Status: DC
  Administered 2016-12-09 (×3): 200 mL

## 2016-12-09 NOTE — Progress Notes (Signed)
ANTIBIOTIC CONSULT NOTE - INITIAL  Pharmacy Consult for Vancomycin  Indication: sepsis  No Known Allergies  Patient Measurements: Height: _0  (170.2 cm) Weight: 119 lb 11.4 oz (54.3 kg) IBW/kg (Calculated) : 66.1 Adjusted Body Weight: 61.4 kg   Vital Signs: Temp: 98.8 F (37.1 C) (03/10 1600) Temp Source: Axillary (03/10 1600) BP: 111/80 (03/10 1600) Pulse Rate: 133 (03/10 1600) Intake/Output from previous day: 03/09 0701 - 03/10 0700 In: 3245 [I.V.:325; NG/GT:880; IV Piggyback:1900] Out: 1425 [Urine:1425] Intake/Output from this shift: Total I/O In: 1666 [I.V.:200; IWPYK:998; PJ/AS:5053; IV Piggyback:100] Out: 1000 [Urine:1000]  Labs:  Recent Labs  12/08/16 1359 12/09/16 0549  WBC 5.5 11.3*  HGB 10.5* 8.4*  PLT 409 342  CREATININE 0.46* <0.30*   CrCl cannot be calculated (This lab value cannot be used to calculate CrCl because it is not a number: <0.30). No results for input(s): VANCOTROUGH, VANCOPEAK, VANCORANDOM, GENTTROUGH, GENTPEAK, GENTRANDOM, TOBRATROUGH, TOBRAPEAK, TOBRARND, AMIKACINPEAK, AMIKACINTROU, AMIKACIN in the last 72 hours.   Microbiology: Recent Results (from the past 720 hour(s))  Culture, sputum-assessment     Status: None   Collection Time: 12/08/16  1:59 PM  Result Value Ref Range Status   Specimen Description EXPECTORATED SPUTUM  Final   Special Requests NONE  Final   Sputum evaluation THIS SPECIMEN IS ACCEPTABLE FOR SPUTUM CULTURE  Final   Report Status 12/08/2016 FINAL  Final  Culture, respiratory (NON-Expectorated)     Status: None (Preliminary result)   Collection Time: 12/08/16  1:59 PM  Result Value Ref Range Status   Specimen Description EXPECTORATED SPUTUM  Final   Special Requests NONE Reflexed from [id number]  Final   Gram Stain   Final    ABUNDANT WBC PRESENT, PREDOMINANTLY PMN ABUNDANT GRAM NEGATIVE RODS ABUNDANT GRAM POSITIVE COCCI IN PAIRS ABUNDANT GRAM POSITIVE RODS    Culture   Final    TOO YOUNG TO READ Performed at  [facility], [address], [place], [place] [address]    Report Status PENDING  Incomplete  Blood Culture (routine x 2)     Status: None (Preliminary result)   Collection Time: 12/08/16  2:07 PM  Result Value Ref Range Status   Specimen Description BLOOD RT HAND  Final   Special Requests BOTTLES DRAWN AEROBIC AND ANAEROBIC BCAV  Final   Culture NO GROWTH < 24 HOURS  Final   Report Status PENDING  Incomplete  Blood Culture (routine x 2)     Status: None (Preliminary result)   Collection Time: 12/08/16  2:07 PM  Result Value Ref Range Status   Specimen Description BLOOD  RT HAND  Final   Special Requests BOTTLES DRAWN AEROBIC AND ANAEROBIC  BCAV  Final   Culture  Setup Time   Final    GRAM POSITIVE COCCI ANAEROBIC BOTTLE ONLY CRITICAL RESULT CALLED TO, READ BACK BY AND VERIFIED WITH: Violeta Gelinas @ 1721 12/09/16 by [facility]    Culture [place]  Final   Report Status PENDING  Incomplete  Blood Culture ID Panel (Reflexed)     Status: Abnormal   Collection Time: 12/08/16  2:07 PM  Result Value Ref Range Status   Enterococcus species NOT DETECTED NOT DETECTED Final   Listeria monocytogenes NOT DETECTED NOT DETECTED Final   Staphylococcus species DETECTED (A) NOT DETECTED Final    Comment: Methicillin (oxacillin) resistant coagulase negative staphylococcus. Possible blood culture contaminant (unless isolated from more than one blood culture draw or clinical case suggests pathogenicity). No antibiotic treatment is  indicated for blood  culture contaminants. CRITICAL RESULT CALLED TO, READ BACK BY AND VERIFIED WITH: Violeta Gelinas @ 8413 12/09/16 by [place]    Staphylococcus aureus NOT DETECTED NOT DETECTED Final   Methicillin resistance DETECTED (A) NOT DETECTED Final    Comment: CRITICAL RESULT CALLED TO, READ BACK BY AND VERIFIED WITH: Violeta Gelinas @ 2440 12/09/16 by [place]    Streptococcus species NOT DETECTED NOT DETECTED Final   Streptococcus agalactiae NOT DETECTED NOT  DETECTED Final   Streptococcus pneumoniae NOT DETECTED NOT DETECTED Final   Streptococcus pyogenes NOT DETECTED NOT DETECTED Final   Acinetobacter baumannii NOT DETECTED NOT DETECTED Final   Enterobacteriaceae species NOT DETECTED NOT DETECTED Final   Enterobacter cloacae complex NOT DETECTED NOT DETECTED Final   Escherichia coli NOT DETECTED NOT DETECTED Final   Klebsiella oxytoca NOT DETECTED NOT DETECTED Final   Klebsiella pneumoniae NOT DETECTED NOT DETECTED Final   Proteus species NOT DETECTED NOT DETECTED Final   Serratia marcescens NOT DETECTED NOT DETECTED Final   Haemophilus influenzae NOT DETECTED NOT DETECTED Final   Neisseria meningitidis NOT DETECTED NOT DETECTED Final   Pseudomonas aeruginosa NOT DETECTED NOT DETECTED Final   Candida albicans NOT DETECTED NOT DETECTED Final   Candida glabrata NOT DETECTED NOT DETECTED Final   Candida krusei NOT DETECTED NOT DETECTED Final   Candida parapsilosis NOT DETECTED NOT DETECTED Final   Candida tropicalis NOT DETECTED NOT DETECTED Final  MRSA PCR Screening     Status: None   Collection Time: 12/08/16  7:06 PM  Result Value Ref Range Status   MRSA by PCR NEGATIVE NEGATIVE Final    Comment:        The GeneXpert MRSA Assay (FDA approved for NASAL specimens only), is one component of a comprehensive MRSA colonization surveillance program. It is not intended to diagnose MRSA infection nor to guide or monitor treatment for MRSA infections.   Culture, respiratory (NON-Expectorated)     Status: None (Preliminary result)   Collection Time: 12/08/16  9:34 PM  Result Value Ref Range Status   Specimen Description INDUCED SPUTUM  Final   Special Requests NONE  Final   Gram Stain   Final    ABUNDANT WBC PRESENT, PREDOMINANTLY PMN FEW GRAM POSITIVE COCCI IN PAIRS    Culture   Final    TOO YOUNG TO READ Performed at [facility], [place] [address], [place], [place] [address]    Report Status PENDING  Incomplete    Medical  History: Past Medical History:  Diagnosis Date  . ALS (amyotrophic lateral sclerosis) ([place])   . Diabetes mellitus without complication ([facility])     Medications:  Scheduled:  . albuterol  2.5 mg Nebulization Q8H  . amitriptyline  25 mg Oral QHS  . busPIRone  10 mg Per Tube TID  . ceFEPime (MAXIPIME) IV  2 g Intravenous Q8H  . chlorhexidine gluconate (MEDLINE KIT)  15 mL Mouth Rinse BID  . DULoxetine  60 mg Oral Daily  . enoxaparin (LOVENOX) injection  40 mg Subcutaneous Q24H  . famotidine  20 mg Per Tube Daily  . feeding supplement (OSMOLITE 1.5 CAL)  237 mL Per Tube Q4H  . free water  100 mL Per Tube Q8H  . hydrocerin  1 application Topical BID  . insulin aspart  0-9 Units Subcutaneous Q6H  . mouth rinse  15 mL Mouth Rinse 10 times per day  . [START ON 12/10/2016] pneumococcal 23 valent vaccine  0.5 mL Intramuscular Tomorrow-1000  .  polyethylene glycol  17 g Per Tube Daily  . riluzole  50 mg Per Tube BID  . sodium chloride flush  3 mL Intravenous Q12H  . sodium chloride flush  3 mL Intravenous Q12H  . vancomycin  750 mg Intravenous Once  . [START ON 12/10/2016] vancomycin  750 mg Intravenous Q8H   Assessment: Pharmacy consulted to dose vancomycin in this 41 year old male admitted with sepsis, HCAP, Blood Cx growing GPC (Mech A positive).   Pt has ALS so unable to get reliable SrCr.   Will use 0.8 for SrCr .   CrCl = 96.2 ml/min Ke = 0.084 hr-1 T1/2 = 8.25 hrs Vd = 43 L   Goal of Therapy:  Vancomycin trough level 15-20 mcg/ml  Plan:  Expected duration 7 days with resolution of temperature and/or normalization of WBC   Vancomycin 750 mg IV X 1 given on 3/10 @ 18:00. Vancomycin 750 mg IV Q8H ordered to start on 3/11 @ 00:00, 6 hrs after 1st dose (stacked dosing). This pt will reach Css by 3/12 @ 10:00. Will draw 1st trough on 3/12 @ 0730, which will be very close to Css.   Keyundra Fant D 12/09/2016,5:41 PM

## 2016-12-09 NOTE — Progress Notes (Signed)
Pharmacy Antibiotic Note  Randy Roach is a 41 y.o. male admitted on 12/08/2016 with pneumonia.  Pharmacy has been consulted for vancomycin and cefepime dosing. Pt also receiving levofloxacin. Provider wishes to double cover for pseudomonas for HCAP.  CXR: LLL infiltrate and having green sputum production Pt white count 5.5; PCT < 0.1; afebrile  Plan: Begin cefepime 2 g IV q 8 hours for pseudomonas coverage  Vancomycin: Spoke with MD Kalisetti about MRSA PCR. MD would like to continue Vancomycin until Blood culture results have finalized. If Blood cultures are negative ok to d/c Vancomycin. Will Continue Vancomycin 750 mg IV q12 hours for now.   Height: 5\' 7"  (170.2 cm) Weight: 119 lb 11.4 oz (54.3 kg) IBW/kg (Calculated) : 66.1  Temp (24hrs), Avg:98.5 F (36.9 C), Min:97.7 F (36.5 C), Max:98.8 F (37.1 C)   Recent Labs Lab 12/08/16 1359 12/08/16 1406 12/09/16 0549  WBC 5.5  --  11.3*  CREATININE 0.46*  --  <0.30*  LATICACIDVEN  --  1.3  --     CrCl cannot be calculated (This lab value cannot be used to calculate CrCl because it is not a number: <0.30).    No Known Allergies  Antimicrobials this admission: cefepime 3/9 >>  levofloxacin 3/9 >>  Vancomycin 3/9 >>  Dose adjustments this admission:  Microbiology results: 3/9 BCx: sent  UCx:  3/9 Sputum: sent  3/9 MRSA PCR: negative   Thank you for allowing pharmacy to be a part of this patient's care.  Demetrius Charityeldrin D. Madden Garron, PharmD Clinical Pharmacist  8:53 AM

## 2016-12-09 NOTE — Consult Note (Signed)
WOC Nurse wound consult note Reason for Consult: Consulted for peri-trach and peri-gastrostomy tube insertion site skin care orders.  No wounds (chronic or acute) are noted: no medical device related pressure injuries (MDRPIs), no moisture associated skin damage (MASD). Heels, sacrum and other bony prominences are intact. Wound type: None Pressure Injury POA: No Measurement:N/A Wound bed:N/A Drainage (amount, consistency, odor) gastrostomy insertion site has stomatized, so moisture (light yellow to light brown) accumulates in this area; this is not an unusual finding. The peri-trach area is clear, free of erythema. Periwound:Intact Dressing procedure/placement/frequency: I have provided Nursing with guidance via the Orders for twice daily skin care to the peri-tube insertion sites of the trach and the Gastrostomy tubes consisting of a NS cleanse and gently, but thoroughly patting the skin dry. A single fenestrated drain sponge is to be used for the absorption of any exudate and for the padding of the two medical devices. If necessary, the drain sponge can be secured with minimal paper tape, but typically this is not needed and refraining from use will prevent medical adhesive related skin injury (MARSI). Patient is on a therapeutic mattress with low air loss feature while in ICU.  It is noted that a Palliative Care Consultation with patient and family will take place next week. Thank you for this consultation. WOC nursing team will not follow, but will remain available to this patient, the nursing and medical teams.  Please re-consult if needed. Thanks, Ladona MowLaurie Jeanni Allshouse, MSN, RN, GNP, Hans EdenCWOCN, CWON-AP, FAAN  Pager# [phone]

## 2016-12-09 NOTE — Progress Notes (Signed)
Place contact isolation order per Kalisetti, BCs + for MRSA.  Acknowledged by Nemiah CommanderKalisetti.  Pt has 30 cc residual before AM feed, 0 before other feeds.  Continent of urine, BM today, A&O, responds appropriately, family at bedside in afternoon, very helpful with patient.  He has copious secretions, which family says is normal for him

## 2016-12-09 NOTE — Progress Notes (Signed)
Brief Nutrition Note  Consult received for enteral/tube feeding initiation and management.  Pt appears to have presented on home tube feeding regimen of Osmolite 1.5 (237 ml infused at 50 cc/hr q 4 hrs?). The rate would need to be increased if goal was for patient to receive 6 cans per day. At current infused  rate of 50 cc/hr, pt receiving ~5 cans of osmolite 1.5  Will begin protocol with existing TF order. RD operating remotely today, RD on site can clarify tomorrow.   Adult Enteral Nutrition Protocol initiated. Full assessment to follow.  Admitting Dx: Tracheitis [J04.10] Pneumonia of left lower lobe due to infectious organism [facility](HCC) [J18.1]   Labs:   Recent Labs Lab 12/08/16 1359 12/09/16 0549  NA 138 136  K 4.1 3.6  CL 102 103  CO2 28 27  BUN 14 10  CREATININE 0.46* <0.30*  CALCIUM 9.2 8.9  MG  --  1.5*  PHOS  --  3.5  GLUCOSE 176* 115*    Randy Roach RD, Randy Roach, Randy Roach Clinical Nutrition Pager: [phone] 12/09/2016 7:35 AM

## 2016-12-09 NOTE — Progress Notes (Signed)
[facility] [place] [facility]   PATIENT NAME: Randy Roach    MR#:  [id number]  DATE OF BIRTH:  06/10/1976  SUBJECTIVE:  CHIEF COMPLAINT:   Chief Complaint  Patient presents with  . Possible Trach Infection   - admitted from home, has a chronic trach and vent dependent, was having increased secretions- improved after scopolamine patch last night - BP improved with fluids, pain free and resting  REVIEW OF SYSTEMS:  Review of Systems  Unable to perform ROS: Patient nonverbal    DRUG ALLERGIES:  No Known Allergies  VITALS:  Blood pressure 99/62, pulse (!) 103, temperature 98.8 F (37.1 C), temperature source Axillary, resp. rate 12, height 5\' 7"  (1.702 m), weight 54.3 kg (119 lb 11.4 oz), SpO2 100 %.  PHYSICAL EXAMINATION:  Physical Exam  GENERAL:  41 y.o.-year-old thin built patient lying in the bed with no acute distress.  EYES: Pupils equal, round, reactive to light and accommodation. No scleral icterus. Extraocular muscles intact.  HEENT: Head atraumatic, normocephalic. Oropharynx and nasopharynx clear. Trach in place NECK:  Supple, no jugular venous distention. No thyroid enlargement, no tenderness.  LUNGS: coarse breath sounds bilaterally, no wheezing, rales or crepitation. No use of accessory muscles of respiration.  CARDIOVASCULAR: S1, S2 normal. No murmurs, rubs, or gallops.  ABDOMEN: Soft, nontender, nondistended. PEG tube in place, Bowel sounds present. No organomegaly or mass.  EXTREMITIES: No pedal edema, cyanosis, or clubbing. 1+ dorsum of hand edema NEUROLOGIC: able to express yes and no, non verbal, able to move his eyes and lower extremities. PSYCHIATRIC: The patient is alert.  SKIN: No obvious rash, lesion, or ulcer.    LABORATORY PANEL:   CBC  Recent Labs Lab 12/09/16 0549  WBC 11.3*  HGB 8.4*  HCT 24.7*  PLT 342    ------------------------------------------------------------------------------------------------------------------  Chemistries   Recent Labs Lab 12/08/16 1359 12/09/16 0549  NA 138 136  K 4.1 3.6  CL 102 103  CO2 28 27  GLUCOSE 176* 115*  BUN 14 10  CREATININE 0.46* <0.30*  CALCIUM 9.2 8.9  MG  --  1.5*  AST 32  --   ALT 38  --   ALKPHOS 108  --   BILITOT 0.1*  --    ------------------------------------------------------------------------------------------------------------------  Cardiac Enzymes  Recent Labs Lab 12/08/16 1359  TROPONINI <0.03   ------------------------------------------------------------------------------------------------------------------  RADIOLOGY:  Dg Chest Port 1 View  Result Date: 12/08/2016 CLINICAL DATA:  Sepsis EXAM: PORTABLE CHEST 1 VIEW COMPARISON:  05/10/2016 FINDINGS: Left lower lobe infiltrate and small left effusion. Right lung clear. Tracheostomy tip just above the carina. IMPRESSION: Left lower lobe consolidation may represent pneumonia or atelectasis. Small left effusion. Electronically Signed   By: Marlan Palauharles  Clark M.D.   On: 12/08/2016 14:15    EKG:   Orders placed or performed during the hospital encounter of 05/08/16  . ED EKG 12-Lead  . ED EKG 12-Lead  . EKG 12-Lead  . 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, diabetes, anxiety, depression brought from home due to increased trach secretions and noted to have LLL pneumonia  #1 Sepsis- secondary to pneumonia, HCAP- recent discharge from [facility] - blood and sputum cultures - vanc, cefepime and levaquin - not on pressors, BP improving with fluids - continue scopolamine patch for increased secretions and prn suctioning - pulm consult appreciated  #2 DM- sliding scale insulin  #3 Anemia- of chronic disease,  monitor hb, transfuse if <7  #4 ALS- bed bound, non verbal, can communicate yes and no - palliative  care consult - on rilutek  #5 Muscle spasms and chronic back pain- continue muscle relaxants Also on anxiety meds  #6 Hypomagnesemia- being replaced  #7 DVT Prophylaxis- lovenox      All the records are reviewed and case discussed with Care Management/Social Workerr. Management plans discussed with the patient, family and they are in agreement.  CODE STATUS: Full Code  TOTAL TIME TAKING CARE OF THIS PATIENT: 38 minutes.   POSSIBLE D/C IN 2 DAYS, DEPENDING ON CLINICAL CONDITION.   Enid Baas M.D on 12/09/2016 at 7:58 AM  Between 7am to 6pm - Pager - [phone]  After 6pm go to [email or web address] - password [email or web address]  [facility]  Office  [phone]  CC: Primary care physician; Charlyne Petrin, MD, MD

## 2016-12-09 NOTE — Progress Notes (Signed)
I received a call from pharmacy patient is growing staphylococcus in blood cultures. Antibiotic levofloxacin is discontinued and vancomycin is added. We will continue cefepime

## 2016-12-09 NOTE — Progress Notes (Signed)
PHARMACY - PHYSICIAN COMMUNICATION CRITICAL VALUE ALERT - BLOOD CULTURE IDENTIFICATION (BCID)  Results for orders placed or performed during the hospital encounter of 12/08/16  Blood Culture ID Panel (Reflexed) (Collected: 12/08/2016  2:07 PM)  Result Value Ref Range   Enterococcus species NOT DETECTED NOT DETECTED   Listeria monocytogenes NOT DETECTED NOT DETECTED   Staphylococcus species DETECTED (A) NOT DETECTED   Staphylococcus aureus NOT DETECTED NOT DETECTED   Methicillin resistance DETECTED (A) NOT DETECTED   Streptococcus species NOT DETECTED NOT DETECTED   Streptococcus agalactiae NOT DETECTED NOT DETECTED   Streptococcus pneumoniae NOT DETECTED NOT DETECTED   Streptococcus pyogenes NOT DETECTED NOT DETECTED   Acinetobacter baumannii NOT DETECTED NOT DETECTED   Enterobacteriaceae species NOT DETECTED NOT DETECTED   Enterobacter cloacae complex NOT DETECTED NOT DETECTED   Escherichia coli NOT DETECTED NOT DETECTED   Klebsiella oxytoca NOT DETECTED NOT DETECTED   Klebsiella pneumoniae NOT DETECTED NOT DETECTED   Proteus species NOT DETECTED NOT DETECTED   Serratia marcescens NOT DETECTED NOT DETECTED   Haemophilus influenzae NOT DETECTED NOT DETECTED   Neisseria meningitidis NOT DETECTED NOT DETECTED   Pseudomonas aeruginosa NOT DETECTED NOT DETECTED   Candida albicans NOT DETECTED NOT DETECTED   Candida glabrata NOT DETECTED NOT DETECTED   Candida krusei NOT DETECTED NOT DETECTED   Candida parapsilosis NOT DETECTED NOT DETECTED   Candida tropicalis NOT DETECTED NOT DETECTED    Name of physician (or Provider) Contacted: Gouru  Changes to prescribed antibiotics required: Yes, will d/c Levaquin and resume Vancomycin.   Hiliary Osorto D 12/09/2016  5:45 PM

## 2016-12-09 NOTE — Progress Notes (Signed)
Order for enoxaparin 30 mg changed to 40 mg per anticoagulation protocol for CrCl > 30 mL/min and Wt > 45 kg.  Cindi CarbonMary M Caitlinn Klinker, PharmD Clinical Pharmacist 12/09/16 8:15 AM

## 2016-12-10 ENCOUNTER — Inpatient Hospital Stay: Payer: Medicare Other

## 2016-12-10 DIAGNOSIS — J181 Lobar pneumonia, unspecified organism: Secondary | ICD-10-CM

## 2016-12-10 DIAGNOSIS — R7881 Bacteremia: Secondary | ICD-10-CM

## 2016-12-10 LAB — PHOSPHORUS: PHOSPHORUS: 2.7 mg/dL (ref 2.5–4.6)

## 2016-12-10 LAB — BASIC METABOLIC PANEL
ANION GAP: 4 — AB (ref 5–15)
BUN: 11 mg/dL (ref 6–20)
CO2: 34 mmol/L — AB (ref 22–32)
Calcium: 9.4 mg/dL (ref 8.9–10.3)
Chloride: 101 mmol/L (ref 101–111)
Creatinine, Ser: 0.33 mg/dL — ABNORMAL LOW (ref 0.61–1.24)
GFR calc Af Amer: >60 mL/min (ref >60)
GLUCOSE: 107 mg/dL — AB (ref 65–99)
POTASSIUM: 3.9 mmol/L (ref 3.5–5.1)
Sodium: 139 mmol/L (ref 135–145)

## 2016-12-10 LAB — CBC
HEMATOCRIT: 26.8 % — AB (ref 40.0–52.0)
Hemoglobin: 9.3 g/dL — ABNORMAL LOW (ref 13.0–18.0)
MCH: 30.5 pg (ref 26.0–34.0)
MCHC: 34.8 g/dL (ref 32.0–36.0)
MCV: 87.9 fL (ref 80.0–100.0)
Platelets: 331 10*3/uL (ref 150–440)
RBC: 3.05 MIL/uL — AB (ref 4.40–5.90)
RDW: 12.8 % (ref 11.5–14.5)
WBC: 6.8 10*3/uL (ref 3.8–10.6)

## 2016-12-10 LAB — HEMOGLOBIN A1C
HEMOGLOBIN A1C: 5 % (ref 4.8–5.6)
MEAN PLASMA GLUCOSE: 97 mg/dL

## 2016-12-10 LAB — GLUCOSE, CAPILLARY
GLUCOSE-CAPILLARY: 106 mg/dL — AB (ref 65–99)
GLUCOSE-CAPILLARY: 90 mg/dL (ref 65–99)
Glucose-Capillary: 199 mg/dL — ABNORMAL HIGH (ref 65–99)

## 2016-12-10 LAB — HIV ANTIBODY (ROUTINE TESTING W REFLEX): HIV SCREEN 4TH GENERATION: NONREACTIVE

## 2016-12-10 LAB — MAGNESIUM: Magnesium: 2.1 mg/dL (ref 1.7–2.4)

## 2016-12-10 MED ORDER — OSMOLITE 1.5 CAL PO LIQD
237.0000 mL | ORAL | Status: DC
  Administered 2016-12-10 – 2016-12-12 (×13): 237 mL

## 2016-12-10 MED ORDER — FREE WATER
200.0000 mL | Freq: Three times a day (TID) | Status: DC
  Administered 2016-12-10 – 2016-12-12 (×7): 200 mL

## 2016-12-10 MED ORDER — PNEUMOCOCCAL VAC POLYVALENT 25 MCG/0.5ML IJ INJ: 0.5000 mL | INJECTION | INTRAMUSCULAR | Status: DC

## 2016-12-10 NOTE — Progress Notes (Signed)
Name: Randy Roach MRN: [id number] DOB: 25-Oct-1975    ADMISSION DATE:  12/08/2016 CONSULTATION DATE: 12/08/2016  REFERRING MD :  Dr. Winona Legato  CHIEF COMPLAINT:  Possible Trach Infection  PT PROFILE:  41 yo male with hx of ALS and chronic tracheostomy vent dependent presented to [facility] due to concerns of tracheostomy infection due to increased foul smelling tracheostomy drainage CXR in ER revealed ?LLL infiltrate concerning for pneumonia vs. atelectasis with hypotension requiring fluid resuscitation   MAJOR EVENTS/TEST RESULTS: 03/09 admitted to ICU with acute on chronic respiratory failure secondary to ?LLL infiltrate concerning for pneumonia vs. atelectasis with hypotension requiring fluid resuscitation PCCM consulted for additional management pt on home ventilator  03/10: 2/2 BC positive for coag neg staph  INDWELLING DEVICES:: Chronic trach Chronic G tube  MICRO DATA: Resp 03/09 >>  Blood 03/09 >> 2/2 positive for coag neg staph   ANTIMICROBIALS:  Levofloxacin 03/09 >> 03/10 Vanc 03/09 >>  Cefepime 03/09 >>    SUBJECTIVE: No acute issues overnight. Blood cultures +for staph. Antibiotics changed to cefepime and vancomycin. Persistent copious secretions. 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 HEENT: supple, no JVD, trach site clean Cardiovascular: Regular without murmurs Lungs: Scattered rhonchi Abdomen: +BS x4, soft, non tender, non distended Extremities: No edema Skin: intact no rashes or lesions   Recent Labs Lab 12/08/16 1359  12/09/16 0549 12/10/16 0439  NA 138 136 139  K 4.1 3.6 3.9  CL 102 103 101  CO2 28 27 34*  BUN 14 10 11   CREATININE 0.46* <0.30* 0.33*  GLUCOSE 176* 115* 107*    Recent Labs Lab 12/08/16 1359 12/09/16 0549 12/10/16 0439  HGB 10.5* 8.4* 9.3*  HCT 30.1* 24.7* 26.8*  WBC 5.5 11.3* 6.8  PLT 409 342 331   CXR: No significant change left lower lobe infiltrate/atelectasis  ASSESSMENT / PLAN: 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 Acute acute hypoxemic respiratory failure Left lower lobe pneumonia Hypotension, resolved Mild Anemia  Hx: Chronic hypotension (baseline systolic 90-100's) Severe ALS S/P TRACH AND PEG 2/2 positive blood culture with coag negative staph  It is unclear whether this is a true positive or false positive  He does have some potential "portals of entry" including tracheostomy tube and gastrostomy tube  P: Continue home ventilator  Maintain O2 sats >92% Continue Prn bronchodilator therapy Continue airway hygiene Monitor temp, WBC count Micro and abx as above Follow cultures to finality  Trend WBC's and monitor fever curve MAP goal > 60 mmHg Follow chest x-ray intermittently DVT px: Enoxaparin Monitor CBC intermittently Transfuse per usual guidelines Family requesting GI consultation for PEG tube to be changed due to intermittent clogging of the tube Continue SSI q6h Continue tube feeds at home rate  Discussed with Dr.Kalisetti. ID consult to be ordered for 03/12 If the positive blood cultures are deemed to be a true positive, he will likely need a PICC line for home intravenous vancomycin. If Dr. Sampson Goon believes that the positive blood cultures are a false positive, he  will likely be able to go home on an appropriate antral antibiotic depending on the results of the respiratory culture   Billy Fischeravid Simonds, MD PCCM service Mobile [phone] Pager [phone] 12/10/2016

## 2016-12-10 NOTE — Progress Notes (Signed)
Initial Nutrition Assessment  DOCUMENTATION CODES:   Severe malnutrition in context of chronic illness  INTERVENTION:  1. Continue home TF regimen of 237mL osmolite 1.5 q4h and 200mL free water q8h 2. Provides 2133 calories, 89gm protein, 1689cc  Free water 3. Recommend, if patient can tolerate, increase free water to 200cc q6h  NUTRITION DIAGNOSIS:   Malnutrition related to chronic illness (ALS) as evidenced by severe depletion of muscle mass, severe depletion of body fat.  GOAL:   Patient will meet greater than or equal to 90% of their needs  MONITOR:   I & O's, TF tolerance, Labs, Weight trends  REASON FOR ASSESSMENT:   Consult Enteral/tube feeding initiation and management  ASSESSMENT:   41 yo male with hx of ALS and chronic tracheostomy vent dependent [facility] to concerns of tracheostomy infection due to increased foul smelling tracheostomy drainage CXR in ER revealed ?LLL infiltrate concerning for pneumonia vs. atelectasis with hypotension requiring fluid resuscitation   Patient is currently intubated on ventilator support MV: 5.8 L/min Temp (24hrs), Avg:98.7 F (37.1 C), Min:98 F (36.7 C), Max:99.6 F (37.6 C) Propofol: None No family available Patient non-verbal Nutrition-Focused physical exam completed. Findings are severe fat depletion, severe muscle depletion, and no edema.  Labs and medications reviewed: Miralax  Diet Order:  Diet NPO time specified  Skin:  Reviewed, no issues  Last BM:  12/09/2016  Height:   Ht Readings from Last 1 Encounters:  12/10/16 5\' 7"  (1.702 m)    Weight:   Wt Readings from Last 1 Encounters:  12/10/16 113 lb 8.6 oz (51.5 kg)    Ideal Body Weight:  67.27 kg  BMI:  Body mass index is 17.78 kg/m.  Estimated Nutritional Needs:   Kcal:  1587 calories  Protein:  62-77  Fluid:  >/= 1.5L  EDUCATION NEEDS:   No education needs identified at this time  Dionne AnoWilliam M. Lorene Klimas, MS, RD LDN Inpatient  Clinical Dietitian Pager [phone]

## 2016-12-10 NOTE — Progress Notes (Signed)
[employer] - [place] at [facility]   PATIENT NAME: Randy Roach    MR#:  [id number]  DATE OF BIRTH:  [birth year]  SUBJECTIVE:  CHIEF COMPLAINT:   Chief Complaint  Patient presents with  . Possible Trach Infection   - much improved tracheal and nasal secretions -blood cultures with GPC but negative staph aureus but positive MRSA on BCID- awaiting final cultures - BP is better, no fevers  REVIEW OF SYSTEMS:  Review of Systems  Unable to perform ROS: Patient nonverbal    DRUG ALLERGIES:  No Known Allergies  VITALS:  Blood pressure 106/67, pulse (!) 118, temperature 98 F (36.7 C), temperature source Axillary, resp. rate 15, height 5\' 7"  (1.702 m), weight 51.5 kg (113 lb 8.6 oz), SpO2 100 %.  PHYSICAL EXAMINATION:  Physical Exam  GENERAL:  41 y.o.-year-old thin built, pleasant patient lying in the bed with no acute distress.  EYES: Pupils equal, round, reactive to light and accommodation. No scleral icterus. Extraocular muscles intact.  HEENT: Head atraumatic, normocephalic. Oropharynx and nasopharynx clear. Trach in place NECK:  Supple, no jugular venous distention. No thyroid enlargement, no tenderness.  LUNGS: coarse breath sounds bilaterally, no wheezing, rales or crepitation. No use of accessory muscles of respiration.  CARDIOVASCULAR: S1, S2 normal. No murmurs, rubs, or gallops.  ABDOMEN: Soft, nontender, nondistended. PEG tube in place, Bowel sounds present. No organomegaly or mass.  EXTREMITIES: No pedal edema, cyanosis, or clubbing. 1+ dorsum of hand edema NEUROLOGIC: able to express yes and no, non verbal, able to move his eyes and lower extremities. PSYCHIATRIC: The patient is alert.  SKIN: No obvious rash, lesion, or ulcer.    LABORATORY PANEL:   CBC  Recent Labs Lab 12/10/16 0439  WBC 6.8  HGB 9.3*  HCT 26.8*  PLT 331    ------------------------------------------------------------------------------------------------------------------  Chemistries   Recent Labs Lab 12/08/16 1359  12/10/16 0439  NA 138  < > 139  K 4.1  < > 3.9  CL 102  < > 101  CO2 28  < > 34*  GLUCOSE 176*  < > 107*  BUN 14  < > 11  CREATININE 0.46*  < > 0.33*  CALCIUM 9.2  < > 9.4  MG  --   < > 2.1  AST 32  --   --   ALT 38  --   --   ALKPHOS 108  --   --   BILITOT 0.1*  --   --   < > = values in this interval not displayed. ------------------------------------------------------------------------------------------------------------------  Cardiac Enzymes  Recent Labs Lab 12/08/16 1359  TROPONINI <0.03   ------------------------------------------------------------------------------------------------------------------  RADIOLOGY:  Dg Chest Port 1 View  Result Date: 12/10/2016 CLINICAL DATA:  Fever and shortness of breath.  Patient with ALS. EXAM: PORTABLE CHEST 1 VIEW COMPARISON:  12/08/2016 and prior exams FINDINGS: Tracheostomy tube is again noted, with tip 2.5 cm above the carina. Continued left lower lung opacity -question atelectasis/ consolidation. A possible left pleural effusion again noted. The right lung is clear. There is no evidence of pneumothorax. IMPRESSION: Unchanged appearance of the chest with continued left lower lung consolidation/ atelectasis and possible left pleural effusion Electronically Signed   By: Harmon Pier M.D.   On: 12/10/2016 11:18   Dg Chest Port 1 View  Result Date: 12/08/2016 CLINICAL DATA:  Sepsis EXAM: PORTABLE CHEST 1 VIEW COMPARISON:  05/10/2016 FINDINGS: Left lower lobe infiltrate and small left effusion. Right lung clear. Tracheostomy tip just above the  carina. IMPRESSION: Left lower lobe consolidation may represent pneumonia or atelectasis. Small left effusion. Electronically Signed   By: Marlan Palauharles  Clark M.D.   On: 12/08/2016 14:15    EKG:   Orders placed or performed during the  hospital encounter of 05/08/16  . ED EKG 12-Lead  . ED EKG 12-Lead  . EKG 12-Lead  . 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 - ID consult for the same. Continue vanc and cefepime for now - not on pressors, BP improved with fluids - much improved upper airway secretions and prn suctioning - pulm consult appreciated - continue home vent settings for trach - wbc normal, no fevers  #2 DM- sliding scale insulin  #3 Anemia- of chronic disease, monitor hb, transfuse if <7  #4 ALS- bed bound, non verbal, can communicate yes and no - palliative care consult - on rilutek  #5 Muscle spasms and chronic back pain- continue muscle relaxants Also on anxiety meds  #6 Hypomagnesemia- replaced  #7 DVT Prophylaxis- lovenox      All the records are reviewed and case discussed with Care Management/Social Workerr. Management plans discussed with the patient, family and they are in agreement.  CODE STATUS: Full Code  TOTAL TIME TAKING CARE OF THIS PATIENT: 38 minutes.   POSSIBLE D/C IN 1-2 DAYS, DEPENDING ON CLINICAL CONDITION.   Enid BaasKALISETTI,Jonhatan Hearty M.D on 12/10/2016 at 12:05 PM  Between 7am to 6pm - Pager - [phone]  After 6pm go to [email or web address] - password [email or web address]  [facility] [place] [facility]  Office  [phone]  CC: Primary care physician; Charlyne PetrinLiza R Straub, MD, MD

## 2016-12-11 DIAGNOSIS — J9612 Chronic respiratory failure with hypercapnia: Secondary | ICD-10-CM

## 2016-12-11 DIAGNOSIS — J9611 Chronic respiratory failure with hypoxia: Secondary | ICD-10-CM

## 2016-12-11 LAB — CBC
HEMATOCRIT: 24.7 % — AB (ref 40.0–52.0)
HEMOGLOBIN: 8.5 g/dL — AB (ref 13.0–18.0)
MCH: 29.8 pg (ref 26.0–34.0)
MCHC: 34.5 g/dL (ref 32.0–36.0)
MCV: 86.4 fL (ref 80.0–100.0)
Platelets: 336 10*3/uL (ref 150–440)
RBC: 2.86 MIL/uL — AB (ref 4.40–5.90)
RDW: 12.7 % (ref 11.5–14.5)
WBC: 6 10*3/uL (ref 3.8–10.6)

## 2016-12-11 LAB — BASIC METABOLIC PANEL
Anion gap: 6 (ref 5–15)
BUN: 15 mg/dL (ref 6–20)
CHLORIDE: 97 mmol/L — AB (ref 101–111)
CO2: 33 mmol/L — AB (ref 22–32)
Calcium: 9.5 mg/dL (ref 8.9–10.3)
Creatinine, Ser: 0.34 mg/dL — ABNORMAL LOW (ref 0.61–1.24)
GFR calc non Af Amer: >60 mL/min (ref >60)
Glucose, Bld: 70 mg/dL (ref 65–99)
POTASSIUM: 3.5 mmol/L (ref 3.5–5.1)
Sodium: 136 mmol/L (ref 135–145)

## 2016-12-11 LAB — GLUCOSE, CAPILLARY
GLUCOSE-CAPILLARY: 103 mg/dL — AB (ref 65–99)
GLUCOSE-CAPILLARY: 205 mg/dL — AB (ref 65–99)
GLUCOSE-CAPILLARY: 87 mg/dL (ref 65–99)
Glucose-Capillary: 100 mg/dL — ABNORMAL HIGH (ref 65–99)
Glucose-Capillary: 159 mg/dL — ABNORMAL HIGH (ref 65–99)

## 2016-12-11 LAB — CULTURE, RESPIRATORY W GRAM STAIN: Culture: NORMAL

## 2016-12-11 LAB — CULTURE, RESPIRATORY: CULTURE: NORMAL

## 2016-12-11 LAB — VANCOMYCIN, TROUGH: Vancomycin Tr: 13 ug/mL — ABNORMAL LOW (ref 15–20)

## 2016-12-11 MED ORDER — VANCOMYCIN HCL IN DEXTROSE 1-5 GM/200ML-% IV SOLN
1000.0000 mg | Freq: Three times a day (TID) | INTRAVENOUS | Status: DC
  Filled 2016-12-11 (×3): qty 200

## 2016-12-11 MED ORDER — SCOPOLAMINE 1 MG/3DAYS TD PT72
1.0000 | MEDICATED_PATCH | TRANSDERMAL | Status: DC
  Administered 2016-12-11: 1.5 mg via TRANSDERMAL
  Filled 2016-12-11: qty 1

## 2016-12-11 NOTE — Progress Notes (Signed)
Zanaflex 4mg  given for muscle spasms.

## 2016-12-11 NOTE — Care Management (Signed)
Per MD please assess LTAC again. Patient recently left [facility]. I have asked them to review case again.

## 2016-12-11 NOTE — Consult Note (Signed)
[facility] Infectious Disease     Reason for Consult:bacteremia    Referring Physician: Denton Lank Date of Admission:  12/08/2016   Active Problems:   Left lower lobe pneumonia ([place])   Hyperglycemia   Anemia    HPI: Randy Roach is a 41 y.o. 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 On admit he was afebrile but cxr with LLL infiltrate. He has been on vanco, levo, cefepime and sputum cx shows resp flora. [place] in both sets with GPC.  He has had no fevers, no wbc. Sputum is improving   Past Medical History:  Diagnosis Date  . ALS (amyotrophic lateral sclerosis) ([place])   . Diabetes mellitus without complication [facility])    Past Surgical History:  Procedure Laterality Date  . APPENDECTOMY    . PEG TUBE PLACEMENT Left    Social History  Substance Use Topics  . Smoking status: Former Smoker    Packs/day: 1.00    Types: Cigarettes  . Smokeless tobacco: Never Used  . Alcohol use No     Comment: occ   History reviewed. No pertinent family history.  Allergies: No Known Allergies  Current antibiotics: Antibiotics Given (last 72 hours)    Date/Time Action Medication Dose Rate   12/09/16 0145 Given   ceFEPIme (MAXIPIME) 2 g in dextrose 5 % 50 mL IVPB 2 g 100 mL/hr   12/09/16 0145 Given   vancomycin (VANCOCIN) IVPB 750 mg/150 ml premix 750 mg 150 mL/hr   12/09/16 0600 Given   ceFEPIme (MAXIPIME) 2 g in dextrose 5 % 50 mL IVPB 2 g 100 mL/hr   12/09/16 1432 Given   ceFEPIme (MAXIPIME) 2 g in dextrose 5 % 50 mL IVPB 2 g 100 mL/hr   12/09/16 1834 Given   vancomycin (VANCOCIN) IVPB 750 mg/150 ml premix 750 mg 150 mL/hr   12/09/16 2218 Given   ceFEPIme (MAXIPIME) 2 g in dextrose 5 % 50 mL IVPB 2 g 100 mL/hr   12/10/16 0007 Given   vancomycin (VANCOCIN) IVPB 750 mg/150 ml premix 750 mg 150 mL/hr   12/10/16 0525 Given   ceFEPIme (MAXIPIME) 2 g in dextrose 5 % 50 mL IVPB 2 g 100 mL/hr   12/10/16 0711 Given   vancomycin (VANCOCIN)  IVPB 750 mg/150 ml premix 750 mg 150 mL/hr   12/10/16 1514 Given   ceFEPIme (MAXIPIME) 2 g in dextrose 5 % 50 mL IVPB 2 g 100 mL/hr   12/10/16 1514 Given   vancomycin (VANCOCIN) IVPB 750 mg/150 ml premix 750 mg 150 mL/hr   12/10/16 2241 Given   ceFEPIme (MAXIPIME) 2 g in dextrose 5 % 50 mL IVPB 2 g 100 mL/hr   12/11/16 0026 Given   vancomycin (VANCOCIN) IVPB 750 mg/150 ml premix 750 mg 150 mL/hr   12/11/16 0514 Given   ceFEPIme (MAXIPIME) 2 g in dextrose 5 % 50 mL IVPB 2 g 100 mL/hr   12/11/16 0824 Given   vancomycin (VANCOCIN) IVPB 750 mg/150 ml premix 750 mg 150 mL/hr   12/11/16 1445 Given   ceFEPIme (MAXIPIME) 2 g in dextrose 5 % 50 mL IVPB 2 g 100 mL/hr      MEDICATIONS: . albuterol  2.5 mg Nebulization Q8H  . amitriptyline  25 mg Oral QHS  . busPIRone  10 mg Per Tube TID  . ceFEPime (MAXIPIME) IV  2 g Intravenous Q8H  . chlorhexidine gluconate (MEDLINE KIT)  15 mL Mouth Rinse BID  . DULoxetine  60 mg Oral Daily  . enoxaparin (LOVENOX) injection  40 mg Subcutaneous Q24H  . famotidine  20 mg Per Tube Daily  . feeding supplement (OSMOLITE 1.5 CAL)  237 mL Per Tube Q4H  . free water  200 mL Per Tube Q8H  . hydrocerin  1 application Topical BID  . insulin aspart  0-9 Units Subcutaneous Q6H  . mouth rinse  15 mL Mouth Rinse 10 times per day  . pneumococcal 23 valent vaccine  0.5 mL Intramuscular Tomorrow-1000  . polyethylene glycol  17 g Per Tube Daily  . riluzole  50 mg Per Tube BID  . scopolamine  1 patch Transdermal Q72H  . sodium chloride flush  3 mL Intravenous Q12H  . sodium chloride flush  3 mL Intravenous Q12H  . vancomycin  1,000 mg Intravenous Q8H    Review of Systems - unable to obtain  OBJECTIVE: Temp:  [96.5 F (35.8 C)-98.5 F (36.9 C)] 98.2 F (36.8 C) (03/12 1200) Pulse Rate:  [75-123] 75 (03/12 1200) Resp:  [10-21] 11 (03/12 1200) BP: (77-109)/(50-83) 84/53 (03/12 1200) SpO2:  [98 %-100 %] 100 % (03/12 1200) Physical Exam  Constitutional: thin, ill  appearing HENT: anicteric Mouth/Throat: Oropharynx is clear - trach site in place Cardiovascular: Normal rate, regular rhythm and normal heart sounds.  Pulmonary/Chest: bil rhonchi Abdominal: Soft. Bowel sounds are normal. He exhibits no distension. Peg tube site wn. Lymphadenopathy:  He has no cervical adenopathy.  Neurological: He is awake, nods yes, no. Moves LE Skin: Skin is warm and dry. No rash noted. No erythema.  Psychiatric: He has a normal mood and affect. His behavior is normal.      LABS: Results for orders placed or performed during the hospital encounter of 12/08/16 (from the past 48 hour(s))  Glucose, capillary     Status: Abnormal   Collection Time: 12/09/16  6:09 PM  Result Value Ref Range   Glucose-Capillary 203 (H) 65 - 99 mg/dL  Glucose, capillary     Status: None   Collection Time: 12/09/16 11:21 PM  Result Value Ref Range   Glucose-Capillary 95 65 - 99 mg/dL  CBC     Status: Abnormal   Collection Time: 12/10/16  4:39 AM  Result Value Ref Range   WBC 6.8 3.8 - 10.6 K/uL   RBC 3.05 (L) 4.40 - 5.90 MIL/uL   Hemoglobin 9.3 (L) 13.0 - 18.0 g/dL   HCT 26.8 (L) 40.0 - 52.0 %   MCV 87.9 80.0 - 100.0 fL   MCH 30.5 26.0 - 34.0 pg   MCHC 34.8 32.0 - 36.0 g/dL   RDW 12.8 11.5 - 14.5 %   Platelets 331 150 - 440 K/uL  Basic metabolic panel     Status: Abnormal   Collection Time: 12/10/16  4:39 AM  Result Value Ref Range   Sodium 139 135 - 145 mmol/L   Potassium 3.9 3.5 - 5.1 mmol/L   Chloride 101 101 - 111 mmol/L   CO2 34 (H) 22 - 32 mmol/L   Glucose, Bld 107 (H) 65 - 99 mg/dL   BUN 11 6 - 20 mg/dL   Creatinine, Ser 0.33 (L) 0.61 - 1.24 mg/dL   Calcium 9.4 8.9 - 10.3 mg/dL   GFR calc non Af Amer >60 >60 mL/min   GFR calc Af Amer >60 >60 mL/min    Comment: (NOTE) The eGFR has been calculated using the CKD EPI equation. This calculation has not been validated in all clinical situations. eGFR's persistently <  60 mL/min signify possible Chronic  Kidney Disease.    Anion gap 4 (L) 5 - 15  Magnesium     Status: None   Collection Time: 12/10/16  4:39 AM  Result Value Ref Range   Magnesium 2.1 1.7 - 2.4 mg/dL  Phosphorus     Status: None   Collection Time: 12/10/16  4:39 AM  Result Value Ref Range   Phosphorus 2.7 2.5 - 4.6 mg/dL  Glucose, capillary     Status: Abnormal   Collection Time: 12/10/16  5:19 AM  Result Value Ref Range   Glucose-Capillary 106 (H) 65 - 99 mg/dL  Glucose, capillary     Status: None   Collection Time: 12/10/16 11:48 AM  Result Value Ref Range   Glucose-Capillary 90 65 - 99 mg/dL  Glucose, capillary     Status: Abnormal   Collection Time: 12/10/16  5:22 PM  Result Value Ref Range   Glucose-Capillary 199 (H) 65 - 99 mg/dL  Glucose, capillary     Status: Abnormal   Collection Time: 12/11/16 12:43 AM  Result Value Ref Range   Glucose-Capillary 100 (H) 65 - 99 mg/dL  Glucose, capillary     Status: Abnormal   Collection Time: 12/11/16  6:44 AM  Result Value Ref Range   Glucose-Capillary 159 (H) 65 - 99 mg/dL  Basic metabolic panel     Status: Abnormal   Collection Time: 12/11/16  8:18 AM  Result Value Ref Range   Sodium 136 135 - 145 mmol/L   Potassium 3.5 3.5 - 5.1 mmol/L   Chloride 97 (L) 101 - 111 mmol/L   CO2 33 (H) 22 - 32 mmol/L   Glucose, Bld 70 65 - 99 mg/dL   BUN 15 6 - 20 mg/dL   Creatinine, Ser 0.34 (L) 0.61 - 1.24 mg/dL   Calcium 9.5 8.9 - 10.3 mg/dL   GFR calc non Af Amer >60 >60 mL/min   GFR calc Af Amer >60 >60 mL/min    Comment: (NOTE) The eGFR has been calculated using the CKD EPI equation. This calculation has not been validated in all clinical situations. eGFR's persistently <60 mL/min signify possible Chronic Kidney Disease.    Anion gap 6 5 - 15  Vancomycin, trough     Status: Abnormal   Collection Time: 12/11/16  8:18 AM  Result Value Ref Range   Vancomycin Tr 13 (L) 15 - 20 ug/mL  CBC     Status: Abnormal   Collection Time: 12/11/16  8:18 AM  Result Value Ref  Range   WBC 6.0 3.8 - 10.6 K/uL   RBC 2.86 (L) 4.40 - 5.90 MIL/uL   Hemoglobin 8.5 (L) 13.0 - 18.0 g/dL   HCT 24.7 (L) 40.0 - 52.0 %   MCV 86.4 80.0 - 100.0 fL   MCH 29.8 26.0 - 34.0 pg   MCHC 34.5 32.0 - 36.0 g/dL   RDW 12.7 11.5 - 14.5 %   Platelets 336 150 - 440 K/uL  Glucose, capillary     Status: None   Collection Time: 12/11/16 12:21 PM  Result Value Ref Range   Glucose-Capillary 87 65 - 99 mg/dL   No components found for: ESR, C REACTIVE PROTEIN MICRO: Recent Results (from the past 720 hour(s))  Culture, sputum-assessment     Status: None   Collection Time: 12/08/16  1:59 PM  Result Value Ref Range Status   Specimen Description EXPECTORATED SPUTUM  Final   Special Requests NONE  Final   Sputum evaluation  THIS SPECIMEN IS ACCEPTABLE FOR SPUTUM CULTURE  Final   Report Status 12/08/2016 FINAL  Final  Culture, respiratory (NON-Expectorated)     Status: None   Collection Time: 12/08/16  1:59 PM  Result Value Ref Range Status   Specimen Description EXPECTORATED SPUTUM  Final   Special Requests NONE Reflexed from [id number]  Final   Gram Stain   Final    ABUNDANT WBC PRESENT, PREDOMINANTLY PMN ABUNDANT GRAM NEGATIVE RODS ABUNDANT GRAM POSITIVE COCCI IN PAIRS ABUNDANT GRAM POSITIVE RODS    Culture   Final    Consistent with normal respiratory flora. Performed at [facility], [place] [address] [place], [place] [address]    Report Status 12/11/2016 FINAL  Final  Blood Culture (routine x 2)     Status: Abnormal (Preliminary result)   Collection Time: 12/08/16  2:07 PM  Result Value Ref Range Status   Specimen Description BLOOD RT HAND  Final   Special Requests BOTTLES DRAWN AEROBIC AND ANAEROBIC BCAV  Final   Culture  Setup Time   Final    GRAM POSITIVE COCCI IN BOTH AEROBIC AND ANAEROBIC BOTTLES CRITICAL VALUE NOTED.  VALUE IS CONSISTENT WITH PREVIOUSLY REPORTED AND CALLED VALUE.    Culture (A)  Final    STAPHYLOCOCCUS CAPITIS THE SIGNIFICANCE OF ISOLATING THIS  ORGANISM FROM A SINGLE SET OF BLOOD CULTURES WHEN MULTIPLE SETS ARE DRAWN IS UNCERTAIN. PLEASE NOTIFY THE MICROBIOLOGY DEPARTMENT WITHIN ONE WEEK IF SPECIATION AND SENSITIVITIES ARE REQUIRED. Performed at [facility], [place] [address] [place], [place] [address]    Report Status PENDING  Incomplete  Blood Culture (routine x 2)     Status: Abnormal (Preliminary result)   Collection Time: 12/08/16  2:07 PM  Result Value Ref Range Status   Specimen Description BLOOD  RT HAND  Final   Special Requests BOTTLES DRAWN AEROBIC AND ANAEROBIC  BCAV  Final   Culture  Setup Time   Final    GRAM POSITIVE COCCI ANAEROBIC BOTTLE ONLY CRITICAL RESULT CALLED TO, READ BACK BY AND VERIFIED WITH: Violeta Gelinas @ 1721 12/09/16 by [facility]    Culture (A)  Final    STAPHYLOCOCCUS HOMINIS THE SIGNIFICANCE OF ISOLATING THIS ORGANISM FROM A SINGLE SET OF BLOOD CULTURES WHEN MULTIPLE SETS ARE DRAWN IS UNCERTAIN. PLEASE NOTIFY THE MICROBIOLOGY DEPARTMENT WITHIN ONE WEEK IF SPECIATION AND SENSITIVITIES ARE REQUIRED. Performed at [facility], [place] [address], [place], [place] [address]    Report Status PENDING  Incomplete  Blood Culture ID Panel (Reflexed)     Status: Abnormal   Collection Time: 12/08/16  2:07 PM  Result Value Ref Range Status   Enterococcus species NOT DETECTED NOT DETECTED Final   Listeria monocytogenes NOT DETECTED NOT DETECTED Final   Staphylococcus species DETECTED (A) NOT DETECTED Final    Comment: Methicillin (oxacillin) resistant coagulase negative staphylococcus. Possible blood culture contaminant (unless isolated from more than one blood culture draw or clinical case suggests pathogenicity). No antibiotic treatment is indicated for blood  culture contaminants. CRITICAL RESULT CALLED TO, READ BACK BY AND VERIFIED WITH: Violeta Gelinas @ 8921 12/09/16 by [place]    Staphylococcus aureus NOT DETECTED NOT DETECTED Final   Methicillin resistance DETECTED (A) NOT DETECTED Final    Comment:  CRITICAL RESULT CALLED TO, READ BACK BY AND VERIFIED WITH: Violeta Gelinas @ 1941 12/09/16 by [place]    Streptococcus species NOT DETECTED NOT DETECTED Final   Streptococcus agalactiae NOT DETECTED NOT DETECTED Final   Streptococcus  pneumoniae NOT DETECTED NOT DETECTED Final   Streptococcus pyogenes NOT DETECTED NOT DETECTED Final   Acinetobacter baumannii NOT DETECTED NOT DETECTED Final   Enterobacteriaceae species NOT DETECTED NOT DETECTED Final   Enterobacter cloacae complex NOT DETECTED NOT DETECTED Final   Escherichia coli NOT DETECTED NOT DETECTED Final   Klebsiella oxytoca NOT DETECTED NOT DETECTED Final   Klebsiella pneumoniae NOT DETECTED NOT DETECTED Final   Proteus species NOT DETECTED NOT DETECTED Final   Serratia marcescens NOT DETECTED NOT DETECTED Final   Haemophilus influenzae NOT DETECTED NOT DETECTED Final   Neisseria meningitidis NOT DETECTED NOT DETECTED Final   Pseudomonas aeruginosa NOT DETECTED NOT DETECTED Final   Candida albicans NOT DETECTED NOT DETECTED Final   Candida glabrata NOT DETECTED NOT DETECTED Final   Candida krusei NOT DETECTED NOT DETECTED Final   Candida parapsilosis NOT DETECTED NOT DETECTED Final   Candida tropicalis NOT DETECTED NOT DETECTED Final  MRSA PCR Screening     Status: None   Collection Time: 12/08/16  7:06 PM  Result Value Ref Range Status   MRSA by PCR NEGATIVE NEGATIVE Final    Comment:        The GeneXpert MRSA Assay (FDA approved for NASAL specimens only), is one component of a comprehensive MRSA colonization surveillance program. It is not intended to diagnose MRSA infection nor to guide or monitor treatment for MRSA infections.   Culture, respiratory (NON-Expectorated)     Status: None   Collection Time: 12/08/16  9:34 PM  Result Value Ref Range Status   Specimen Description INDUCED SPUTUM  Final   Special Requests NONE  Final   Gram Stain   Final    ABUNDANT WBC PRESENT, PREDOMINANTLY PMN FEW GRAM POSITIVE COCCI IN  PAIRS    Culture   Final    Consistent with normal respiratory flora. Performed at [facility], [place] [address] [place], [place] [address]    Report Status 12/11/2016 FINAL  Final    IMAGING: Dg Chest Port 1 View  Result Date: 12/10/2016 CLINICAL DATA:  Fever and shortness of breath.  Patient with ALS. EXAM: PORTABLE CHEST 1 VIEW COMPARISON:  12/08/2016 and prior exams FINDINGS: Tracheostomy tube is again noted, with tip 2.5 cm above the carina. Continued left lower lung opacity -question atelectasis/ consolidation. A possible left pleural effusion again noted. The right lung is clear. There is no evidence of pneumothorax. IMPRESSION: Unchanged appearance of the chest with continued left lower lung consolidation/ atelectasis and possible left pleural effusion Electronically Signed   By: Margarette Canada M.D.   On: 12/10/2016 11:18   Dg Chest Port 1 View  Result Date: 12/08/2016 CLINICAL DATA:  Sepsis EXAM: PORTABLE CHEST 1 VIEW COMPARISON:  05/10/2016 FINDINGS: Left lower lobe infiltrate and small left effusion. Right lung clear. Tracheostomy tip just above the carina. IMPRESSION: Left lower lobe consolidation may represent pneumonia or atelectasis. Small left effusion. Electronically Signed   By: Franchot Gallo M.D.   On: 12/08/2016 14:15    Assessment:   HUGHEY RITTENBERRY is a 41 y.o. 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 Sputum cx with mixed flora. BCX 1/2 Staph hominis, 1/2 Staph capitis.  I do not think he has a bloodstream infection and the cultures with two forms of coag neg staph suggest contaminant. Repeat bcx are pending. He has no fevers, clinically improving. MRSA PCR negative  Recommendations No need for vancomycin.  Continue treatment with  cefepime. If worsens would add anaerobic coverage with flagyl Would rec a 7 day course of treatment for VAP.  Can extend if worsens. Thank you very much for allowing me to participate  in the care of this patient. Please call with questions.   Cheral Marker. Ola Spurr, MD

## 2016-12-11 NOTE — Care Management (Addendum)
Notified by [facility] that they can accept patient back. Patient's mother would rather give him more time here and get through the winter weather. RNCM will follow up with patient's mother tomorrow. [facility]

## 2016-12-11 NOTE — Plan of Care (Signed)
Problem: Pain Managment: Goal: General experience of comfort will improve Outcome: Progressing Pt communicates understanding of pain medication options available to him and request them appropriately.  Reports good pain relief with prescribed medications.

## 2016-12-11 NOTE — Plan of Care (Signed)
Problem: Nutrition: Goal: Adequate nutrition will be maintained Outcome: Progressing Tolerating TF.  No N/V.  Residuals<5960ml.

## 2016-12-11 NOTE — Care Management Note (Signed)
Case Management Note  Patient Details  Name: Randy Roach MRN: [id number] Date of Birth: 08/01/1976  Subjective/Objective:                  Spoke with patient's mother regarding LTAC potential option. [facility]. She states he was there for 6-7 months. She states [facility].She states that he has not been able to use his electric wheelchair since discharge from LTAC. He is vent dependent. He will need EMS to home.  Action/Plan: Referral with patient's mother's permission to [facility]. [facility] Deb Tucker notified of patient admission.   Expected Discharge Date:                  Expected Discharge Plan:     In-House Referral:     Discharge planning Services     Post Acute Care Choice:  Home Health, Long Term Acute Care (LTAC) Choice offered to:  Parent  DME Arranged:    DME Agency:     HH Arranged:  RN HH Agency:  [facility]  Status of Service:  In process, will continue to follow  If discussed at Long Length of Stay Meetings, dates discussed:    Additional Comments:  Collie Siadngela Karina Nofsinger, RN 12/11/2016, 11:21 AM

## 2016-12-11 NOTE — Progress Notes (Signed)
Name: Randy Roach MRN: [id number] DOB: 12/23/1975    ADMISSION DATE:  12/08/2016 CONSULTATION DATE: 12/08/2016  REFERRING MD :  Dr. Winona LegatoVaickute  CHIEF COMPLAINT:  Possible Trach Infection  PT PROFILE:  41 yo male with hx of ALS and chronic tracheostomy vent dependent [facility] due to concerns of tracheostomy infection due to increased foul smelling tracheostomy drainage CXR in ER revealed ?LLL infiltrate concerning for pneumonia vs. atelectasis with hypotension requiring fluid resuscitation   MAJOR EVENTS/TEST RESULTS: 03/09 admitted to ICU with acute on chronic respiratory failure secondary to ?LLL infiltrate concerning for pneumonia vs. atelectasis with hypotension requiring fluid resuscitation PCCM consulted for additional management pt on home ventilator  03/10: 2/2 BC positive for coag neg staph 03/11 on chronic vent  INDWELLING DEVICES:: Chronic trach Chronic G tube  MICRO DATA: Resp 03/09 >>  Blood 03/09 >> 2/2 positive for coag neg staph   ANTIMICROBIALS:  Levofloxacin 03/09 >> 03/10 Vanc 03/09 >>  Cefepime 03/09 >>   SUBJECTIVE: No acute issues overnight. Blood cultures +for staph. Antibiotics changed to cefepime and vancomycin. Persistent copious secretions. 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 HEENT: supple, no JVD, trach site clean Cardiovascular: Regular without murmurs Lungs: Scattered rhonchi Abdomen: +BS x4, soft, non tender, non distended Extremities: No edema Skin: intact no rashes or lesions   Recent Labs Lab 12/08/16 1359 12/09/16 0549 12/10/16 0439  NA 138 136 139  K  4.1 3.6 3.9  CL 102 103 101  CO2 28 27 34*  BUN 14 10 11   CREATININE 0.46* <0.30* 0.33*  GLUCOSE 176* 115* 107*    Recent Labs Lab 12/08/16 1359 12/09/16 0549 12/10/16 0439  HGB 10.5* 8.4* 9.3*  HCT 30.1* 24.7* 26.8*  WBC 5.5 11.3* 6.8  PLT 409 342 331   CXR: No significant change left lower lobe infiltrate/atelectasis  ASSESSMENT / PLAN: 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 Acute acute hypoxemic respiratory failure Left lower lobe pneumonia Hypotension, resolved Mild Anemia  Hx: Chronic hypotension (baseline systolic 90-100's) Severe ALS S/P TRACH AND PEG 2/2 positive blood culture with coag negative staph  It is unclear whether this is a true positive or false positive  He does have some potential "portals of entry" including tracheostomy tube and gastrostomy tube  P: Continue home ventilator  Maintain O2 sats >92% Continue Prn bronchodilator therapy Continue airway hygiene Monitor temp, WBC count Micro and abx as above Follow cultures to finality  Trend WBC's and monitor fever curve MAP goal > 60 mmHg(baseline SBP 90's) Follow chest x-ray intermittently DVT px: Enoxaparin Monitor CBC intermittently Transfuse per usual guidelines Family requesting GI consultation for PEG tube to be changed due to intermittent clogging of the tube-will see call schedule and see if GI available Continue SSI q6h Continue tube feeds at home rate  ID consult to be ordered for 03/12-awaiting further recs If the positive blood cultures are deemed to be a true positive, he will likely need a PICC line for home intravenous vancomycin. If Dr. Sampson GoonFitzgerald believes that the positive blood cultures are a false positive, he will likely be  able to go home on an appropriate antral antibiotic depending on the results of the respiratory culture    Lucie Leather, M.D.  [facility]  Medical Director [facility] Medical Director [facility]  Cardio-Pulmonary Department

## 2016-12-11 NOTE — Progress Notes (Signed)
[employer] [facility] [place] at [facility]   PATIENT NAME: Randy Roach    MR#:  [id number]  DATE OF BIRTH:  [birth year]  SUBJECTIVE:  CHIEF COMPLAINT:   Chief Complaint  Patient presents with  . Possible Trach Infection   - still has tracheal and nasal secretions but very clear and thin on suctioning -blood cultures with GPC but negative staph aureus but positive MRSA on BCID- awaiting final cultures - BP is better, no fevers  REVIEW OF SYSTEMS:  Review of Systems  Unable to perform ROS: Patient nonverbal    DRUG ALLERGIES:  No Known Allergies  VITALS:  Blood pressure 109/83, pulse (!) 123, temperature 98 F (36.7 C), resp. rate (!) 21, height 5\' 7"  (1.702 m), weight 51.5 kg (113 lb 8.6 oz), SpO2 100 %.  PHYSICAL EXAMINATION:  Physical Exam  GENERAL:  41 y.o.-year-old thin built, pleasant patient lying in the bed with no acute distress.  EYES: Pupils equal, round, reactive to light and accommodation. No scleral icterus. Extraocular muscles intact.  HEENT: Head atraumatic, normocephalic. Oropharynx and nasopharynx clear. Trach in place NECK:  Supple, no jugular venous distention. No thyroid enlargement, no tenderness.  LUNGS: improving coarse breath sounds bilaterally, no wheezing, rales or crepitation. No use of accessory muscles of respiration.  CARDIOVASCULAR: S1, S2 normal. No murmurs, rubs, or gallops.  ABDOMEN: Soft, nontender, nondistended. PEG tube in place, Bowel sounds present. No organomegaly or mass.  EXTREMITIES: No pedal edema, cyanosis, or clubbing. 1+ dorsum of hand edema NEUROLOGIC: able to express yes and no, non verbal, able to move his eyes and lower extremities. PSYCHIATRIC: The patient is alert.  SKIN: No obvious rash, lesion, or ulcer.    LABORATORY PANEL:   CBC  Recent Labs Lab 12/11/16 0818  WBC 6.0  HGB 8.5*  HCT 24.7*  PLT 336    ------------------------------------------------------------------------------------------------------------------  Chemistries   Recent Labs Lab 12/08/16 1359  12/10/16 0439 12/11/16 0818  NA 138  < > 139 136  K 4.1  < > 3.9 3.5  CL 102  < > 101 97*  CO2 28  < > 34* 33*  GLUCOSE 176*  < > 107* 70  BUN 14  < > 11 15  CREATININE 0.46*  < > 0.33* 0.34*  CALCIUM 9.2  < > 9.4 9.5  MG  --   < > 2.1  --   AST 32  --   --   --   ALT 38  --   --   --   ALKPHOS 108  --   --   --   BILITOT 0.1*  --   --   --   < > = values in this interval not displayed. ------------------------------------------------------------------------------------------------------------------  Cardiac Enzymes  Recent Labs Lab 12/08/16 1359  TROPONINI <0.03   ------------------------------------------------------------------------------------------------------------------  RADIOLOGY:  Dg Chest Port 1 View  Result Date: 12/10/2016 CLINICAL DATA:  Fever and shortness of breath.  Patient with ALS. EXAM: PORTABLE CHEST 1 VIEW COMPARISON:  12/08/2016 and prior exams FINDINGS: Tracheostomy tube is again noted, with tip 2.5 cm above the carina. Continued left lower lung opacity -question atelectasis/ consolidation. A possible left pleural effusion again noted. The right lung is clear. There is no evidence of pneumothorax. IMPRESSION: Unchanged appearance of the chest with continued left lower lung consolidation/ atelectasis and possible left pleural effusion Electronically Signed   By: Harmon Pier M.D.   On: 12/10/2016 11:18    EKG:   Orders placed or  performed during the hospital encounter of 05/08/16  . ED EKG 12-Lead  . ED EKG 12-Lead  . EKG 12-Lead  . 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 - ID consult for the same. Continue vanc and cefepime for now - not on pressors, BP improved with fluids - much improved upper airway secretions and prn suctioning - pulm consult appreciated - continue home vent settings for trach - wbc normal, no fevers  #2 DM- sliding scale insulin  #3 Anemia- of chronic disease, monitor hb, transfuse if <7  #4 ALS- bed bound, non verbal, can communicate yes and no - palliative care consult - on rilutek  #5 Muscle spasms and chronic back pain- continue muscle relaxants Also on anxiety meds  #6 Hypomagnesemia- replaced  #7 DVT Prophylaxis- lovenox  Care manager consult for LTAC referral again    All the records are reviewed and case discussed with Care Management/Social Workerr. Management plans discussed with the patient, family and they are in agreement.  CODE STATUS: Full Code  TOTAL TIME TAKING CARE OF THIS PATIENT: 36 minutes.   POSSIBLE D/C IN 1-2 DAYS, DEPENDING ON CLINICAL CONDITION.   Enid BaasKALISETTI,Tracie Lindbloom M.D on 12/11/2016 at 12:16 PM  Between 7am to 6pm - Pager - [phone]  After 6pm go to [email or web address] - password [email or web address]  [facility]  Office  [phone]  CC: Primary care physician; Charlyne PetrinLiza R Straub, MD, MD

## 2016-12-12 ENCOUNTER — Ambulatory Visit (HOSPITAL_COMMUNITY)
Admission: AD | Admit: 2016-12-12 | Discharge: 2016-12-12 | Disposition: A | Discharge status: Home / Self Care | Payer: Medicare Other | Source: Other Acute Inpatient Hospital | Attending: Internal Medicine | Admitting: Internal Medicine

## 2016-12-12 DIAGNOSIS — J189 Pneumonia, unspecified organism: Secondary | ICD-10-CM | POA: Insufficient documentation

## 2016-12-12 LAB — BASIC METABOLIC PANEL
Anion gap: 7 (ref 5–15)
BUN: 16 mg/dL (ref 6–20)
CO2: 33 mmol/L — ABNORMAL HIGH (ref 22–32)
Calcium: 9.5 mg/dL (ref 8.9–10.3)
Chloride: 96 mmol/L — ABNORMAL LOW (ref 101–111)
Creatinine, Ser: 0.35 mg/dL — ABNORMAL LOW (ref 0.61–1.24)
GFR calc Af Amer: >60 mL/min (ref >60)
GFR calc non Af Amer: >60 mL/min (ref >60)
Glucose, Bld: 137 mg/dL — ABNORMAL HIGH (ref 65–99)
Potassium: 4.1 mmol/L (ref 3.5–5.1)
SODIUM: 136 mmol/L (ref 135–145)

## 2016-12-12 LAB — CULTURE, BLOOD (ROUTINE X 2)

## 2016-12-12 LAB — GLUCOSE, CAPILLARY
GLUCOSE-CAPILLARY: 131 mg/dL — AB (ref 65–99)
GLUCOSE-CAPILLARY: 130 mg/dL — AB (ref 65–99)

## 2016-12-12 MED ORDER — AMOXICILLIN-POT CLAVULANATE 875-125 MG PO TABS: 1.0000 | ORAL_TABLET | Freq: Two times a day (BID) | ORAL | 0 refills | Status: DC

## 2016-12-12 MED ORDER — FLEET ENEMA 7-19 GM/118ML RE ENEM: 1.0000 | ENEMA | Freq: Once | RECTAL | 0 refills | Status: AC | PRN

## 2016-12-12 MED ORDER — OSMOLITE 1.5 CAL PO LIQD: 237.0000 mL | ORAL | 0 refills | Status: AC

## 2016-12-12 MED ORDER — SCOPOLAMINE 1 MG/3DAYS TD PT72: 1.0000 | MEDICATED_PATCH | TRANSDERMAL | 12 refills | Status: AC

## 2016-12-12 MED ORDER — FREE WATER: 200.0000 mL | Freq: Three times a day (TID) | 0 refills | Status: AC

## 2016-12-12 NOTE — Progress Notes (Signed)
1325 Report called to Andres [facility]iel McLaughlin [facility] at [facility]. Dr. Debbe BalesVanEyk will assume his care.

## 2016-12-12 NOTE — Care Management (Signed)
RNCM spoke with patient's mother over phone. She agrees with [facility] if [facility] can [facility]. Carla with [facility] updated. Unit Clerk/RN/MD updated also.

## 2016-12-12 NOTE — Progress Notes (Signed)
[facility] INFECTIOUS DISEASE PROGRESS NOTE Date of Admission:  12/08/2016     ID: Randy Roach is a 41 y.o. male with coag neg staph bacteremia Active Problems:   Left lower lobe pneumonia ([place])   Hyperglycemia   Anemia   Subjective: NO fevers, no change  ROS  Unable to obtain Medications:  Antibiotics Given (last 72 hours)    Date/Time Action Medication Dose Rate   12/09/16 1834 Given   vancomycin (VANCOCIN) IVPB 750 mg/150 ml premix 750 mg 150 mL/hr   12/09/16 2218 Given   ceFEPIme (MAXIPIME) 2 g in dextrose 5 % 50 mL IVPB 2 g 100 mL/hr   12/10/16 0007 Given   vancomycin (VANCOCIN) IVPB 750 mg/150 ml premix 750 mg 150 mL/hr   12/10/16 0525 Given   ceFEPIme (MAXIPIME) 2 g in dextrose 5 % 50 mL IVPB 2 g 100 mL/hr   12/10/16 0711 Given   vancomycin (VANCOCIN) IVPB 750 mg/150 ml premix 750 mg 150 mL/hr   12/10/16 1514 Given   ceFEPIme (MAXIPIME) 2 g in dextrose 5 % 50 mL IVPB 2 g 100 mL/hr   12/10/16 1514 Given   vancomycin (VANCOCIN) IVPB 750 mg/150 ml premix 750 mg 150 mL/hr   12/10/16 2241 Given   ceFEPIme (MAXIPIME) 2 g in dextrose 5 % 50 mL IVPB 2 g 100 mL/hr   12/11/16 0026 Given   vancomycin (VANCOCIN) IVPB 750 mg/150 ml premix 750 mg 150 mL/hr   12/11/16 0514 Given   ceFEPIme (MAXIPIME) 2 g in dextrose 5 % 50 mL IVPB 2 g 100 mL/hr   12/11/16 0824 Given   vancomycin (VANCOCIN) IVPB 750 mg/150 ml premix 750 mg 150 mL/hr   12/11/16 1445 Given   ceFEPIme (MAXIPIME) 2 g in dextrose 5 % 50 mL IVPB 2 g 100 mL/hr   12/11/16 2239 Given   ceFEPIme (MAXIPIME) 2 g in dextrose 5 % 50 mL IVPB 2 g 100 mL/hr   12/12/16 0540 Given   ceFEPIme (MAXIPIME) 2 g in dextrose 5 % 50 mL IVPB 2 g 100 mL/hr   12/12/16 1402 Given   ceFEPIme (MAXIPIME) 2 g in dextrose 5 % 50 mL IVPB 2 g 100 mL/hr     . albuterol  2.5 mg Nebulization Q8H  . amitriptyline  25 mg Oral QHS  . busPIRone  10 mg Per Tube TID  . ceFEPime (MAXIPIME) IV  2 g Intravenous Q8H  . chlorhexidine gluconate  (MEDLINE KIT)  15 mL Mouth Rinse BID  . DULoxetine  60 mg Oral Daily  . enoxaparin (LOVENOX) injection  40 mg Subcutaneous Q24H  . famotidine  20 mg Per Tube Daily  . feeding supplement (OSMOLITE 1.5 CAL)  237 mL Per Tube Q4H  . free water  200 mL Per Tube Q8H  . hydrocerin  1 application Topical BID  . insulin aspart  0-9 Units Subcutaneous Q6H  . mouth rinse  15 mL Mouth Rinse 10 times per day  . pneumococcal 23 valent vaccine  0.5 mL Intramuscular Tomorrow-1000  . polyethylene glycol  17 g Per Tube Daily  . riluzole  50 mg Per Tube BID  . scopolamine  1 patch Transdermal Q72H  . sodium chloride flush  3 mL Intravenous Q12H  . sodium chloride flush  3 mL Intravenous Q12H    Objective: Vital signs in last 24 hours: Temp:  [98.1 F (36.7 C)-98.9 F (37.2 C)] 98.9 F (37.2 C) (03/13 1200) Pulse Rate:  [69-119] 100 (03/13 1200) Resp:  [  12-24] 14 (03/13 1200) BP: (86-114)/(48-85) 102/71 (03/13 1300) SpO2:  [97 %-100 %] 99 % (03/13 1200) FiO2 (%):  [1 %] 1 % (03/12 1959) Constitutional: thin, ill appearing HENT: anicteric Mouth/Throat: Oropharynx is clear - trach site in place Cardiovascular: Normal rate, regular rhythm and normal heart sounds.  Pulmonary/Chest: bil rhonchi Abdominal: Soft. Bowel sounds are normal. He exhibits no distension. Peg tube site wn. Lymphadenopathy:  He has no cervical adenopathy.  Neurological: He is awake, nods yes, no. Moves LE Skin: Skin is warm and dry. No rash noted. No erythema.  Psychiatric: He has a normal mood and affect. His behavior is normal.   Lab Results  Recent Labs  12/10/16 0439 12/11/16 0818 12/12/16 0434  WBC 6.8 6.0  --   HGB 9.3* 8.5*  --   HCT 26.8* 24.7*  --   NA 139 136 136  K 3.9 3.5 4.1  CL 101 97* 96*  CO2 34* 33* 33*  BUN 11 15 16   CREATININE 0.33* 0.34* 0.35*    Microbiology: Results for orders placed or performed during the hospital encounter of 12/08/16  Culture, sputum-assessment     Status: None    Collection Time: 12/08/16  1:59 PM  Result Value Ref Range Status   Specimen Description EXPECTORATED SPUTUM  Final   Special Requests NONE  Final   Sputum evaluation THIS SPECIMEN IS ACCEPTABLE FOR SPUTUM CULTURE  Final   Report Status 12/08/2016 FINAL  Final  Culture, respiratory (NON-Expectorated)     Status: None   Collection Time: 12/08/16  1:59 PM  Result Value Ref Range Status   Specimen Description EXPECTORATED SPUTUM  Final   Special Requests NONE Reflexed from [id number]  Final   Gram Stain   Final    ABUNDANT WBC PRESENT, PREDOMINANTLY PMN ABUNDANT GRAM NEGATIVE RODS ABUNDANT GRAM POSITIVE COCCI IN PAIRS ABUNDANT GRAM POSITIVE RODS    Culture   Final    Consistent with normal respiratory flora. Performed at [facility], [place] [address], [place], [place] [address]    Report Status 12/11/2016 FINAL  Final  Blood Culture (routine x 2)     Status: Abnormal   Collection Time: 12/08/16  2:07 PM  Result Value Ref Range Status   Specimen Description BLOOD RT HAND  Final   Special Requests BOTTLES DRAWN AEROBIC AND ANAEROBIC BCAV  Final   Culture  Setup Time   Final    GRAM POSITIVE COCCI IN BOTH AEROBIC AND ANAEROBIC BOTTLES CRITICAL VALUE NOTED.  VALUE IS CONSISTENT WITH PREVIOUSLY REPORTED AND CALLED VALUE.    Culture (A)  Final    STAPHYLOCOCCUS CAPITIS THE SIGNIFICANCE OF ISOLATING THIS ORGANISM FROM A SINGLE SET OF BLOOD CULTURES WHEN MULTIPLE SETS ARE DRAWN IS UNCERTAIN. PLEASE NOTIFY THE MICROBIOLOGY DEPARTMENT WITHIN ONE WEEK IF SPECIATION AND SENSITIVITIES ARE REQUIRED. Performed at [facility], [place] [address] [place],  [address]    Report Status 12/12/2016 FINAL  Final  Blood Culture (routine x 2)     Status: Abnormal   Collection Time: 12/08/16  2:07 PM  Result Value Ref Range Status   Specimen Description BLOOD  RT HAND  Final   Special Requests BOTTLES DRAWN AEROBIC AND ANAEROBIC  BCAV  Final   Culture  Setup Time   Final    GRAM POSITIVE  COCCI ANAEROBIC BOTTLE ONLY CRITICAL RESULT CALLED TO, READ BACK BY AND VERIFIED WITH: Violeta Gelinas @ [id number] 12/09/16 by [facility]    Culture (A)  Final  STAPHYLOCOCCUS HOMINIS THE SIGNIFICANCE OF ISOLATING THIS ORGANISM FROM A SINGLE SET OF BLOOD CULTURES WHEN MULTIPLE SETS ARE DRAWN IS UNCERTAIN. PLEASE NOTIFY THE MICROBIOLOGY DEPARTMENT WITHIN ONE WEEK IF SPECIATION AND SENSITIVITIES ARE REQUIRED. Performed at [facility], [place] [address] [place], [place] [address]    Report Status 12/12/2016 FINAL  Final  Blood Culture ID Panel (Reflexed)     Status: Abnormal   Collection Time: 12/08/16  2:07 PM  Result Value Ref Range Status   Enterococcus species NOT DETECTED NOT DETECTED Final   Listeria monocytogenes NOT DETECTED NOT DETECTED Final   Staphylococcus species DETECTED (A) NOT DETECTED Final    Comment: Methicillin (oxacillin) resistant coagulase negative staphylococcus. Possible blood culture contaminant (unless isolated from more than one blood culture draw or clinical case suggests pathogenicity). No antibiotic treatment is indicated for blood  culture contaminants. CRITICAL RESULT CALLED TO, READ BACK BY AND VERIFIED WITH: Violeta Gelinas @ [id number] 12/09/16 by [place]    Staphylococcus aureus NOT DETECTED NOT DETECTED Final   Methicillin resistance DETECTED (A) NOT DETECTED Final    Comment: CRITICAL RESULT CALLED TO, READ BACK BY AND VERIFIED WITH: Violeta Gelinas @ [id number] 12/09/16 by [place]    Streptococcus species NOT DETECTED NOT DETECTED Final   Streptococcus agalactiae NOT DETECTED NOT DETECTED Final   Streptococcus pneumoniae NOT DETECTED NOT DETECTED Final   Streptococcus pyogenes NOT DETECTED NOT DETECTED Final   Acinetobacter baumannii NOT DETECTED NOT DETECTED Final   Enterobacteriaceae species NOT DETECTED NOT DETECTED Final   Enterobacter cloacae complex NOT DETECTED NOT DETECTED Final   Escherichia coli NOT DETECTED NOT DETECTED Final   Klebsiella oxytoca NOT DETECTED NOT  DETECTED Final   Klebsiella pneumoniae NOT DETECTED NOT DETECTED Final   Proteus species NOT DETECTED NOT DETECTED Final   Serratia marcescens NOT DETECTED NOT DETECTED Final   Haemophilus influenzae NOT DETECTED NOT DETECTED Final   Neisseria meningitidis NOT DETECTED NOT DETECTED Final   Pseudomonas aeruginosa NOT DETECTED NOT DETECTED Final   Candida albicans NOT DETECTED NOT DETECTED Final   Candida glabrata NOT DETECTED NOT DETECTED Final   Candida krusei NOT DETECTED NOT DETECTED Final   Candida parapsilosis NOT DETECTED NOT DETECTED Final   Candida tropicalis NOT DETECTED NOT DETECTED Final  MRSA PCR Screening     Status: None   Collection Time: 12/08/16  7:06 PM  Result Value Ref Range Status   MRSA by PCR NEGATIVE NEGATIVE Final    Comment:        The GeneXpert MRSA Assay (FDA approved for NASAL specimens only), is one component of a comprehensive MRSA colonization surveillance program. It is not intended to diagnose MRSA infection nor to guide or monitor treatment for MRSA infections.   Culture, respiratory (NON-Expectorated)     Status: None   Collection Time: 12/08/16  9:34 PM  Result Value Ref Range Status   Specimen Description INDUCED SPUTUM  Final   Special Requests NONE  Final   Gram Stain   Final    ABUNDANT WBC PRESENT, PREDOMINANTLY PMN FEW GRAM POSITIVE COCCI IN PAIRS    Culture   Final    Consistent with normal respiratory flora. Performed at [facility], [place] [address] [place], [place] [address]    Report Status 12/11/2016 FINAL  Final  CULTURE, BLOOD (ROUTINE X 2) w Reflex to ID Panel     Status: None (Preliminary result)   Collection Time: 12/11/16 10:35 AM  Result Value Ref Range Status  Specimen Description BLOOD R FA  Final   Special Requests BOTTLES DRAWN AEROBIC AND ANAEROBIC BCHV  Final   Culture NO GROWTH < 24 HOURS  Final   Report Status PENDING  Incomplete  CULTURE, BLOOD (ROUTINE X 2) w Reflex to ID Panel     Status: None  (Preliminary result)   Collection Time: 12/11/16 10:38 AM  Result Value Ref Range Status   Specimen Description BLOOD R HAND  Final   Special Requests BOTTLES DRAWN AEROBIC AND ANAEROBIC BCAV  Final   Culture NO GROWTH < 24 HOURS  Final   Report Status PENDING  Incomplete    Studies/Results: No results found.  Assessment/Plan: Randy Roach is a 41 y.o. 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 Sputum cx with mixed flora. BCX 1/2 Staph hominis, 1/2 Staph capitis.  I do not think he has a bloodstream infection and the cultures with two forms of coag neg staph suggest contaminant. Repeat bcx are pending. He has no fevers, clinically improving. MRSA PCR negative  Recommendations Continue treatment with cefepime while inpatient Would rec a 7 day course of treatment for VAP - can change to oral augmentin at dc if unable to continue IV abx Thank you very much for the consult. Will follow with you.  Kyndall Amero P   12/12/2016, 3:44 PM

## 2016-12-12 NOTE — Discharge Summary (Signed)
[facility] NAME: Randy Roach    MR#:  [id number]  DATE OF BIRTH:  09-Sep-1976  DATE OF ADMISSION:  12/08/2016   ADMITTING PHYSICIAN: Theodoro Grist, MD  DATE OF DISCHARGE: 12/12/2016  PRIMARY CARE PHYSICIAN: Montel Clock, MD, MD   ADMISSION DIAGNOSIS:   Tracheitis [J04.10] Pneumonia of left lower lobe due to infectious organism ([place]) [J18.1]  DISCHARGE DIAGNOSIS:   Active Problems:   Left lower lobe pneumonia ([place])   Hyperglycemia   Anemia   SECONDARY DIAGNOSIS:   Past Medical History:  Diagnosis Date  . ALS (amyotrophic lateral sclerosis) ([place])   . 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 Repeat blood cultures are negative so far. MRSA PCR is negative. -Sputum cultures growing only mixed flora. Patient has tracheitis with increased amounts of clear tracheal secretions at this time. -Chest x-ray with left lower lobe infiltrate. Agree with scopolamine patch. -Discontinue vancomycin. Received cefepime in the hospital. Will be discharged on 7 more days of Augmentin. If worsens, can add anaerobic coverage with Flagyl.  -Appreciate ID consult and pulmonary consult. - not on pressors, BP improved with fluids-patient has borderline low blood pressure. - Continue prn suctioning - continue home vent settings for trach - wbc normal, no fevers  #2 DM- sliding scale insulin  #3 Anemia- of chronic disease, monitor hb, transfuse if <7  #4 ALS- bed bound, non verbal, can communicate yes and no - on rilutek  #5 Muscle spasms and chronic back  pain- continue muscle relaxants Also on anxiety meds  #6 Hypomagnesemia- replaced  #7 DVT Prophylaxis- lovenox  Will be discharged to long-term acute care facility today   DISCHARGE CONDITIONS:   Guarded  CONSULTS OBTAINED:   Treatment Team:  Ander Gaster Pccm, MD Wilhelmina Mcardle, MD Leonel Ramsay, MD  DRUG ALLERGIES:   No Known Allergies DISCHARGE MEDICATIONS:   Allergies as of 12/12/2016   No Known Allergies     Medication List    STOP taking these medications   bisacodyl 10 MG/30ML Enem Commonly known as:  FLEET Replaced by:  sodium phosphate 7-19 GM/118ML Enem You also have another medication with the same name that you need to continue taking as instructed.   fentaNYL 10 mcg/ml Soln infusion   HYDROcodone-acetaminophen 10-325 MG tablet Commonly known as:  NORCO   piperacillin-tazobactam 3.375 GM/50ML IVPB Commonly known as:  ZOSYN   predniSONE 10 MG (21) Tbpk tablet Commonly known as:  STERAPRED UNI-PAK 21 TAB     TAKE these medications   acetaminophen 325 MG tablet Commonly known as:  TYLENOL Take 2 tablets (650 mg total) by mouth every 6 (six) hours as needed for mild pain (or Fever >/= 101).   amoxicillin-clavulanate 875-125 MG tablet Commonly known as:  AUGMENTIN Place 1 tablet into feeding tube 2 (two) times daily. X 7 more days   baclofen 10 MG tablet Commonly known as:  LIORESAL Place 10 mg into feeding tube every 8 (eight) hours as needed for muscle spasms.   bisacodyl 10 MG suppository Commonly known as:  DULCOLAX Place 10 mg rectally as needed for moderate constipation.  What changed:  Another medication with the same name was removed. Continue taking this medication, and follow the directions you see here.   busPIRone 10 MG tablet Commonly known as:  BUSPAR Place 10 mg into feeding tube 3 (three) times daily.   chlorhexidine gluconate (MEDLINE KIT) 0.12 % solution Commonly known as:  PERIDEX 15 mLs by Mouth Rinse route 2  (two) times daily.   clonazePAM 0.5 MG tablet Commonly known as:  KLONOPIN Place 0.5 mg into feeding tube every 12 (twelve) hours as needed for anxiety.   DULoxetine 60 MG capsule Commonly known as:  CYMBALTA Take 60 mg by mouth daily. In peg tube   ELAVIL 25 MG tablet Generic drug:  amitriptyline Take 25 mg by mouth at bedtime. Patient is taking a suspension.   enoxaparin 40 MG/0.4ML injection Commonly known as:  LOVENOX Inject 40 mg into the skin daily.   eucerin cream Apply 1 application topically 2 (two) times daily.   feeding supplement (OSMOLITE 1.5 CAL) Liqd Place 237 mLs into feeding tube every 4 (four) hours. What changed:  how much to take  when to take this   free water Soln Place 200 mLs into feeding tube every 8 (eight) hours.   ipratropium-albuterol 0.5-2.5 (3) MG/3ML Soln Commonly known as:  DUONEB Take 3 mLs by nebulization every 4 (four) hours as needed.   Oxycodone HCl 20 MG Tabs 20 mg by PEG Tube route every 6 (six) hours as needed.   Oxycodone HCl 10 MG Tabs Place 10 mg into feeding tube every 4 (four) hours as needed.   polyethylene glycol packet Commonly known as:  MIRALAX / GLYCOLAX Place 17 g into feeding tube daily.   ranitidine 150 MG tablet Commonly known as:  ZANTAC Take 150 mg by mouth 2 (two) times daily.   riluzole 50 MG tablet Commonly known as:  RILUTEK Place 50 mg into feeding tube 2 (two) times daily.   scopolamine 1 MG/3DAYS Commonly known as:  TRANSDERM-SCOP Place 1 patch (1.5 mg total) onto the skin every 3 (three) days. Start taking on:  12/14/2016   Selenium Sulfide 2.25 % Sham Apply 1 application topically 2 (two) times a week. Monday and Thursday   sodium phosphate 7-19 GM/118ML Enem Place 133 mLs (1 enema total) rectally once as needed for severe constipation. Replaces:  bisacodyl 10 MG/30ML Enem   tiZANidine 4 MG tablet Commonly known as:  ZANAFLEX Place 4 mg into feeding tube every 8 (eight) hours as  needed for muscle spasms.        DISCHARGE INSTRUCTIONS:   1. Follow-up with PCP in 1 week 2. Pulmonary follow-up  DIET:   Tube feeds  ACTIVITY:   Activity as tolerated  OXYGEN:   Home Oxygen: Yes.    Oxygen Delivery: Connected to portable ventilator.  DISCHARGE LOCATION:   [facility], long-term acute care facility   If you experience worsening of your admission symptoms, develop shortness of breath, life threatening emergency, suicidal or homicidal thoughts you must seek medical attention immediately by calling 911 or calling your MD immediately  if symptoms less severe.  You Must read complete instructions/literature along with all the possible adverse reactions/side effects for all the Medicines you take and that have been prescribed to you. Take any new Medicines after you have completely understood and accpet all the possible adverse reactions/side effects.   Please note  You were cared for by a hospitalist during your hospital stay. If you have any questions about your discharge medications  or the care you received while you were in the hospital after you are discharged, you can call the unit and asked to speak with the hospitalist on call if the hospitalist that took care of you is not available. Once you are discharged, your primary care physician will handle any further medical issues. Please note that NO REFILLS for any discharge medications will be authorized once you are discharged, as it is imperative that you return to your primary care physician (or establish a relationship with a primary care physician if you do not have one) for your aftercare needs so that they can reassess your need for medications and monitor your lab values.    On the day of Discharge:  VITAL SIGNS:   Blood pressure (!) 89/60, pulse 96, temperature 98.7 F (37.1 C), resp. rate 16, height 5' 7"  (1.702 m), weight 51.5 kg (113 lb 8.6 oz), SpO2 99 %.  PHYSICAL EXAMINATION:    GENERAL:  41  y.o.-year-old thin built, pleasant patient lying in the bed with no acute distress.  EYES: Pupils equal, round, reactive to light and accommodation. No scleral icterus. Extraocular muscles intact.  HEENT: Head atraumatic, normocephalic. Oropharynx and nasopharynx clear. Trach in place NECK:  Supple, no jugular venous distention. No thyroid enlargement, no tenderness.  LUNGS: improving coarse breath sounds bilaterally, no wheezing, rales or crepitation. No use of accessory muscles of respiration.  CARDIOVASCULAR: S1, S2 normal. No murmurs, rubs, or gallops.  ABDOMEN: Soft, nontender, nondistended. PEG tube in place, Bowel sounds present. No organomegaly or mass.  EXTREMITIES: No pedal edema, cyanosis, or clubbing. 1+ dorsum of hand edema NEUROLOGIC: able to express yes and no, non verbal, able to move his eyes and lower extremities. PSYCHIATRIC: The patient is alert.  SKIN: No obvious rash, lesion, or ulcer.    DATA REVIEW:   CBC  Recent Labs Lab 12/11/16 0818  WBC 6.0  HGB 8.5*  HCT 24.7*  PLT 336    Chemistries   Recent Labs Lab 12/08/16 1359  12/10/16 0439  12/12/16 0434  NA 138  < > 139  < > 136  K 4.1  < > 3.9  < > 4.1  CL 102  < > 101  < > 96*  CO2 28  < > 34*  < > 33*  GLUCOSE 176*  < > 107*  < > 137*  BUN 14  < > 11  < > 16  CREATININE 0.46*  < > 0.33*  < > 0.35*  CALCIUM 9.2  < > 9.4  < > 9.5  MG  --   < > 2.1  --   --   AST 32  --   --   --   --   ALT 38  --   --   --   --   ALKPHOS 108  --   --   --   --   BILITOT 0.1*  --   --   --   --   < > = values in this interval not displayed.   Microbiology Results  Results for orders placed or performed during the hospital encounter of 12/08/16  Culture, sputum-assessment     Status: None   Collection Time: 12/08/16  1:59 PM  Result Value Ref Range Status   Specimen Description EXPECTORATED SPUTUM  Final   Special Requests NONE  Final   Sputum evaluation THIS SPECIMEN IS ACCEPTABLE FOR SPUTUM CULTURE  Final     Report Status 12/08/2016 FINAL  Final  Culture, respiratory (NON-Expectorated)     Status: None   Collection Time: 12/08/16  1:59 PM  Result Value Ref Range Status   Specimen Description EXPECTORATED SPUTUM  Final   Special Requests NONE Reflexed from [id number]  Final   Gram Stain   Final    ABUNDANT WBC PRESENT, PREDOMINANTLY PMN ABUNDANT GRAM NEGATIVE RODS ABUNDANT GRAM POSITIVE COCCI IN PAIRS ABUNDANT GRAM POSITIVE RODS    Culture   Final    Consistent with normal respiratory flora. Performed at [facility], [place] [address], [place], [place] [address]    Report Status 12/11/2016 FINAL  Final  Blood Culture (routine x 2)     Status: Abnormal   Collection Time: 12/08/16  2:07 PM  Result Value Ref Range Status   Specimen Description BLOOD RT HAND  Final   Special Requests BOTTLES DRAWN AEROBIC AND ANAEROBIC BCAV  Final   Culture  Setup Time   Final    GRAM POSITIVE COCCI IN BOTH AEROBIC AND ANAEROBIC BOTTLES CRITICAL VALUE NOTED.  VALUE IS CONSISTENT WITH PREVIOUSLY REPORTED AND CALLED VALUE.    Culture (A)  Final    STAPHYLOCOCCUS CAPITIS THE SIGNIFICANCE OF ISOLATING THIS ORGANISM FROM A SINGLE SET OF BLOOD CULTURES WHEN MULTIPLE SETS ARE DRAWN IS UNCERTAIN. PLEASE NOTIFY THE MICROBIOLOGY DEPARTMENT WITHIN ONE WEEK IF SPECIATION AND SENSITIVITIES ARE REQUIRED. Performed at [facility], [place] [address] [place], [place] [address]    Report Status 12/12/2016 FINAL  Final  Blood Culture (routine x 2)     Status: Abnormal   Collection Time: 12/08/16  2:07 PM  Result Value Ref Range Status   Specimen Description BLOOD  RT HAND  Final   Special Requests BOTTLES DRAWN AEROBIC AND ANAEROBIC  BCAV  Final   Culture  Setup Time   Final    GRAM POSITIVE COCCI ANAEROBIC BOTTLE ONLY CRITICAL RESULT CALLED TO, READ BACK BY AND VERIFIED WITH: Violeta Gelinas @ 1721 12/09/16 by [facility]    Culture (A)  Final    STAPHYLOCOCCUS HOMINIS THE SIGNIFICANCE OF ISOLATING THIS ORGANISM FROM  A SINGLE SET OF BLOOD CULTURES WHEN MULTIPLE SETS ARE DRAWN IS UNCERTAIN. PLEASE NOTIFY THE MICROBIOLOGY DEPARTMENT WITHIN ONE WEEK IF SPECIATION AND SENSITIVITIES ARE REQUIRED. Performed at [facility], [place] [address], [place], [place] [address]    Report Status 12/12/2016 FINAL  Final  Blood Culture ID Panel (Reflexed)     Status: Abnormal   Collection Time: 12/08/16  2:07 PM  Result Value Ref Range Status   Enterococcus species NOT DETECTED NOT DETECTED Final   Listeria monocytogenes NOT DETECTED NOT DETECTED Final   Staphylococcus species DETECTED (A) NOT DETECTED Final    Comment: Methicillin (oxacillin) resistant coagulase negative staphylococcus. Possible blood culture contaminant (unless isolated from more than one blood culture draw or clinical case suggests pathogenicity). No antibiotic treatment is indicated for blood  culture contaminants. CRITICAL RESULT CALLED TO, READ BACK BY AND VERIFIED WITH: Violeta Gelinas @ 3383 12/09/16 by [facility]    Staphylococcus aureus NOT DETECTED NOT DETECTED Final   Methicillin resistance DETECTED (A) NOT DETECTED Final    Comment: CRITICAL RESULT CALLED TO, READ BACK BY AND VERIFIED WITH: Violeta Gelinas @ 2919 12/09/16 by [facility]    Streptococcus species NOT DETECTED NOT DETECTED Final   Streptococcus agalactiae NOT DETECTED NOT DETECTED Final   Streptococcus pneumoniae NOT DETECTED NOT DETECTED Final   Streptococcus pyogenes NOT DETECTED NOT DETECTED Final   Acinetobacter baumannii  NOT DETECTED NOT DETECTED Final   Enterobacteriaceae species NOT DETECTED NOT DETECTED Final   Enterobacter cloacae complex NOT DETECTED NOT DETECTED Final   Escherichia coli NOT DETECTED NOT DETECTED Final   Klebsiella oxytoca NOT DETECTED NOT DETECTED Final   Klebsiella pneumoniae NOT DETECTED NOT DETECTED Final   Proteus species NOT DETECTED NOT DETECTED Final   Serratia marcescens NOT DETECTED NOT DETECTED Final   Haemophilus influenzae NOT DETECTED NOT DETECTED  Final   Neisseria meningitidis NOT DETECTED NOT DETECTED Final   Pseudomonas aeruginosa NOT DETECTED NOT DETECTED Final   Candida albicans NOT DETECTED NOT DETECTED Final   Candida glabrata NOT DETECTED NOT DETECTED Final   Candida krusei NOT DETECTED NOT DETECTED Final   Candida parapsilosis NOT DETECTED NOT DETECTED Final   Candida tropicalis NOT DETECTED NOT DETECTED Final  MRSA PCR Screening     Status: None   Collection Time: 12/08/16  7:06 PM  Result Value Ref Range Status   MRSA by PCR NEGATIVE NEGATIVE Final    Comment:        The GeneXpert MRSA Assay (FDA approved for NASAL specimens only), is one component of a comprehensive MRSA colonization surveillance program. It is not intended to diagnose MRSA infection nor to guide or monitor treatment for MRSA infections.   Culture, respiratory (NON-Expectorated)     Status: None   Collection Time: 12/08/16  9:34 PM  Result Value Ref Range Status   Specimen Description INDUCED SPUTUM  Final   Special Requests NONE  Final   Gram Stain   Final    ABUNDANT WBC PRESENT, PREDOMINANTLY PMN FEW GRAM POSITIVE COCCI IN PAIRS    Culture   Final    Consistent with normal respiratory flora. Performed at [facility], [place] [address], [place], [place] [address]    Report Status 12/11/2016 FINAL  Final  CULTURE, BLOOD (ROUTINE X 2) w Reflex to ID Panel     Status: None (Preliminary result)   Collection Time: 12/11/16 10:35 AM  Result Value Ref Range Status   Specimen Description BLOOD R FA  Final   Special Requests BOTTLES DRAWN AEROBIC AND ANAEROBIC BCHV  Final   Culture NO GROWTH < 24 HOURS  Final   Report Status PENDING  Incomplete  CULTURE, BLOOD (ROUTINE X 2) w Reflex to ID Panel     Status: None (Preliminary result)   Collection Time: 12/11/16 10:38 AM  Result Value Ref Range Status   Specimen Description BLOOD R HAND  Final   Special Requests BOTTLES DRAWN AEROBIC AND ANAEROBIC BCAV  Final   Culture NO GROWTH < 24  HOURS  Final   Report Status PENDING  Incomplete    RADIOLOGY:  No results found.   Management plans discussed with the patient, family and they are in agreement.  CODE STATUS:     Code Status Orders        Start     Ordered   12/08/16 1815  Full code  Continuous     12/08/16 1815    Code Status History    Date Active Date Inactive Code Status Order ID Comments User Context   05/10/2016  1:23 PM 05/11/2016  6:25 PM Partial Code [id number]  Flora Lipps, MD Inpatient   05/09/2016 12:19 AM 05/10/2016  1:23 PM Full Code [id number]  Harvie Bridge, DO Inpatient   11/26/2015  3:15 PM 11/27/2015  3:32 AM Full Code [id number]  Corrie Mckusick, DO HOV      TOTAL  TIME TAKING CARE OF THIS PATIENT: 38 minutes.    Gladstone Lighter M.D on 12/12/2016 at 10:17 AM  Between 7am to 6pm - Pager - [phone]  After 6pm go to [email or web address] - Proofreader  [facility]  Office  [phone]  CC: Primary care physician; Montel Clock, MD, MD   Note: This dictation was prepared with Dragon dictation along with smaller phrase technology. Any transcriptional errors that result from this process are unintentional.

## 2016-12-12 NOTE — Progress Notes (Signed)
1650 Mr. Randy Roach enroute to [facility] via [facility]. Belongings sent home with mom except for 3 replacement inner cannulas size 7 xlt , 2 HME filters, home prescription of Rilutek 50mg . And Eucerin cream. Family aware.

## 2016-12-16 LAB — CULTURE, BLOOD (ROUTINE X 2)
Culture: NO GROWTH
Culture: NO GROWTH

## 2017-01-15 DIAGNOSIS — K5903 Drug induced constipation: Secondary | ICD-10-CM | POA: Insufficient documentation

## 2017-02-23 DIAGNOSIS — J9610 Chronic respiratory failure, unspecified whether with hypoxia or hypercapnia: Secondary | ICD-10-CM | POA: Insufficient documentation

## 2017-03-18 DIAGNOSIS — R238 Other skin changes: Secondary | ICD-10-CM | POA: Insufficient documentation

## 2017-03-27 ENCOUNTER — Other Ambulatory Visit: Payer: Self-pay

## 2017-03-27 DIAGNOSIS — Z431 Encounter for attention to gastrostomy: Secondary | ICD-10-CM

## 2017-04-08 IMAGING — CT CT HEAD WITHOUT CONTRAST
1 series · 16 of 30 positions shown, 20 images · non-contrast
Comparison: 03/28/2014

CLINICAL DATA: CVA.  Slurred speech, alert but not oriented.

EXAM:
CT HEAD WITHOUT CONTRAST
TECHNIQUE: Contiguous axial images were obtained from the base of the skull
through the vertex without intravenous contrast.

[Series 2: head wo · axial · 0.40mm/px · z∈[-582,-438]mm · 16 of 36 slices shown, 20 images]
[im 2/36  brain]
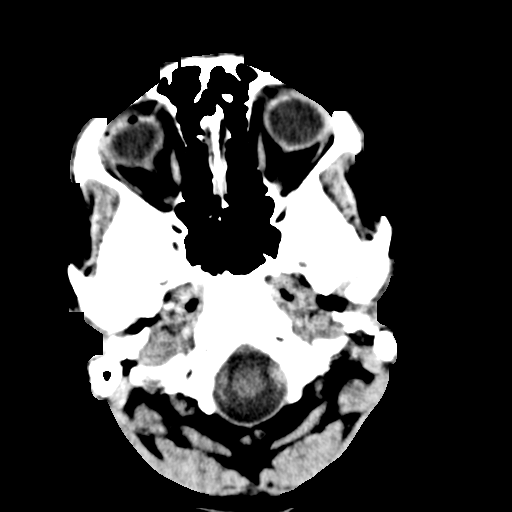
[im 2/36  bone]
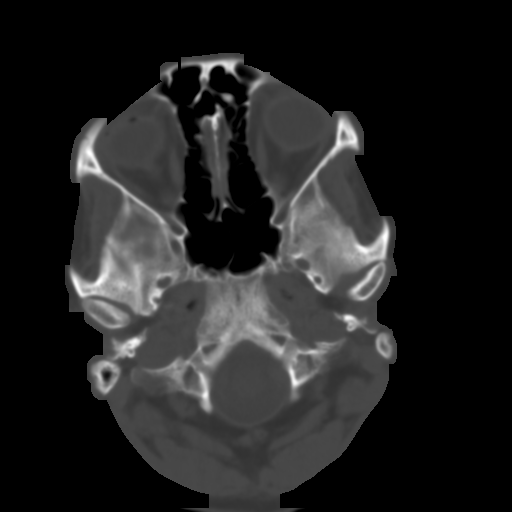
[im 4/36  brain]
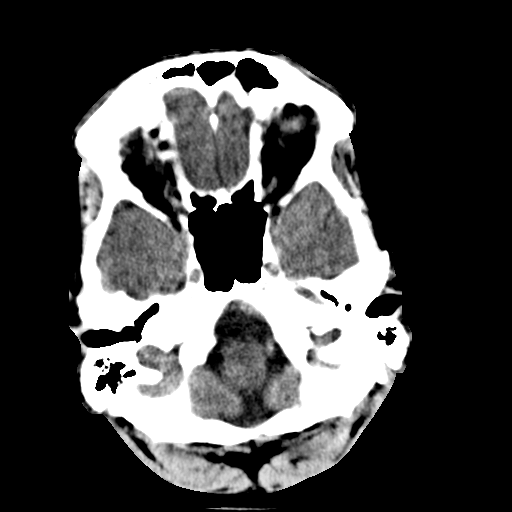
[im 7/36  brain]
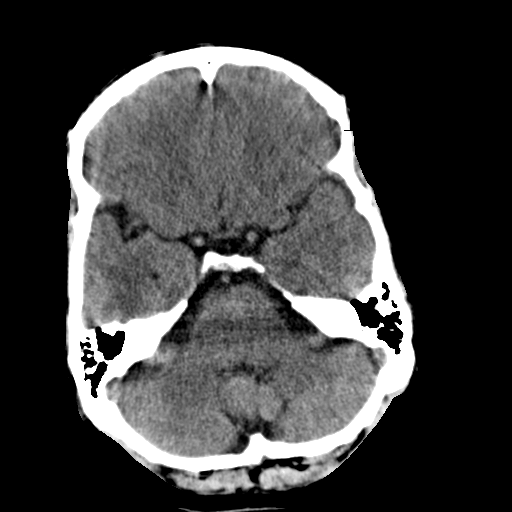
[im 9/36  brain]
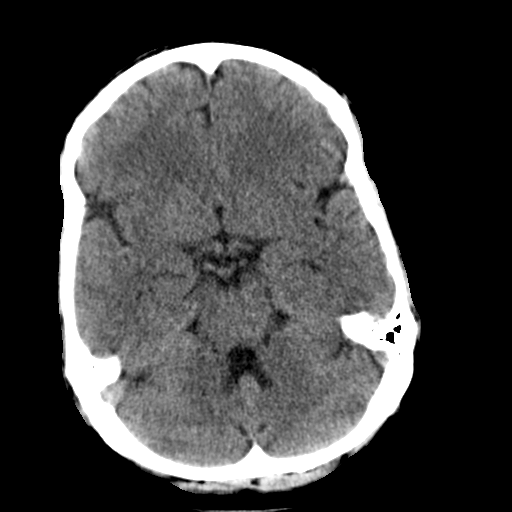
[im 10/36  brain]
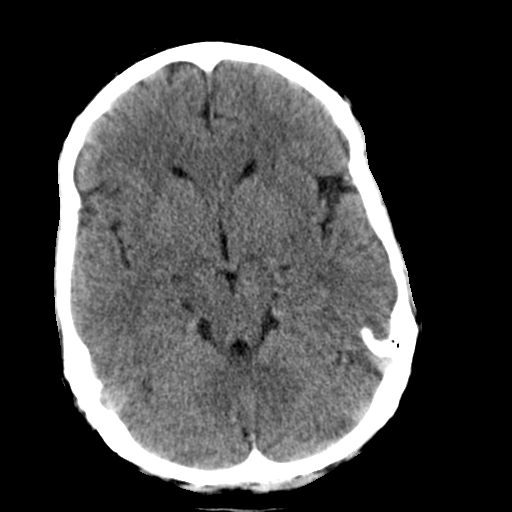
[im 10/36  bone]
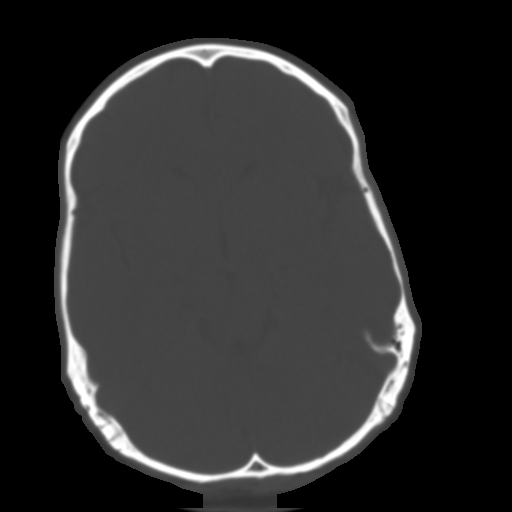
[im 13/36  brain]
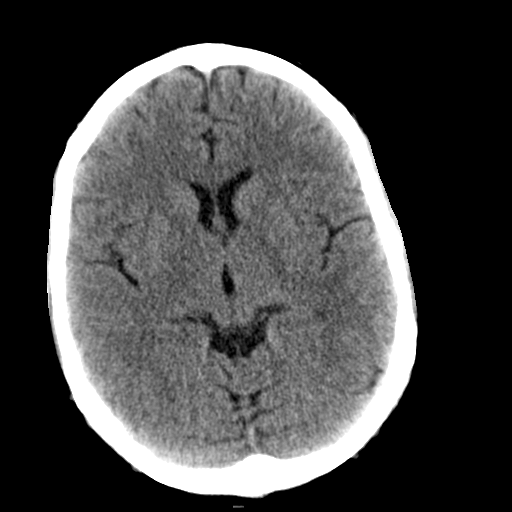
[im 15/36  brain]
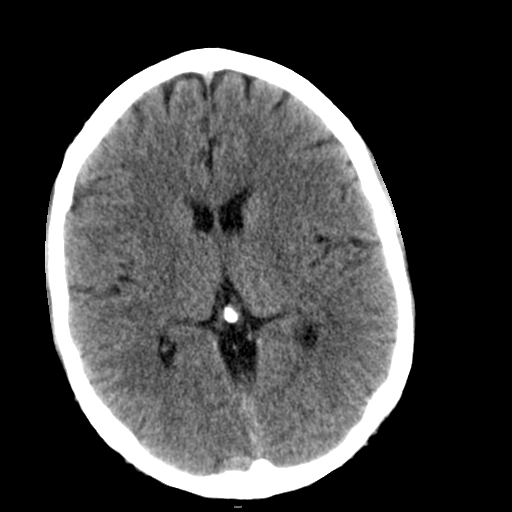
[im 17/36  brain]
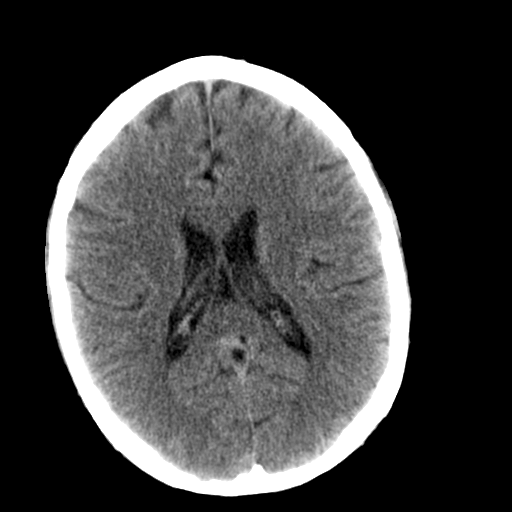
[im 19/36  brain]
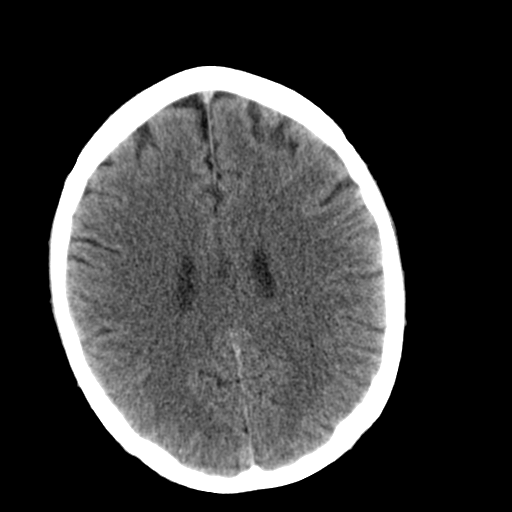
[im 19/36  bone]
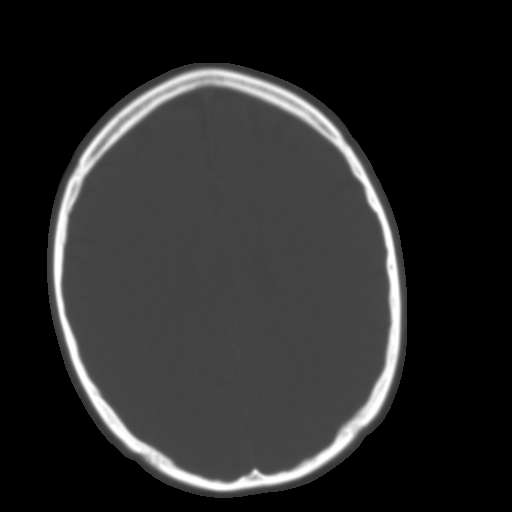
[im 21/36  brain]
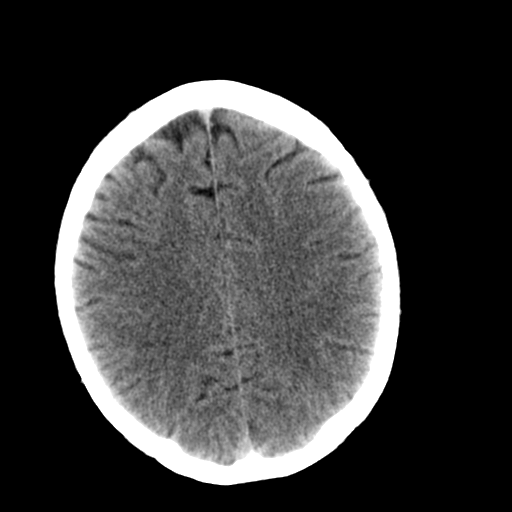
[im 23/36  brain]
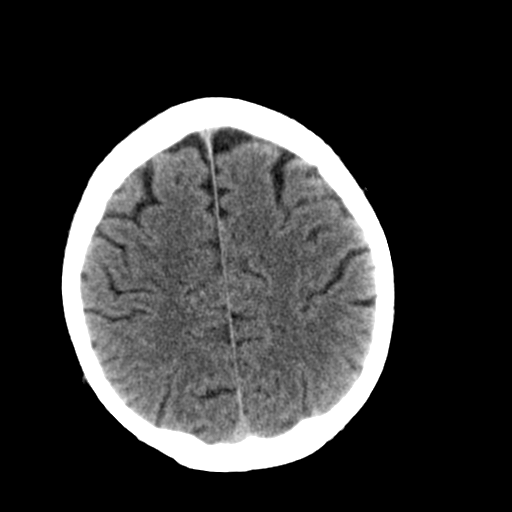
[im 26/36  brain]
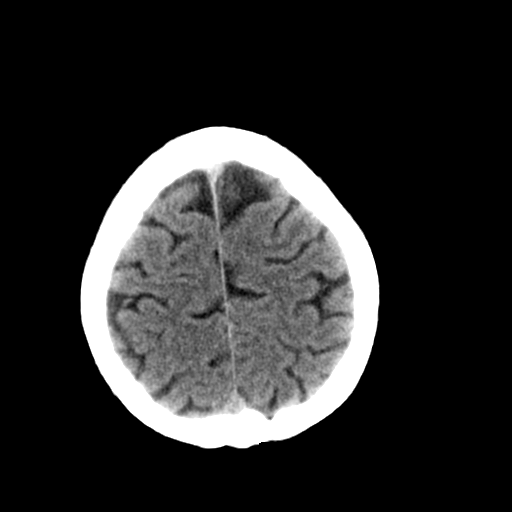
[im 27/36  brain]
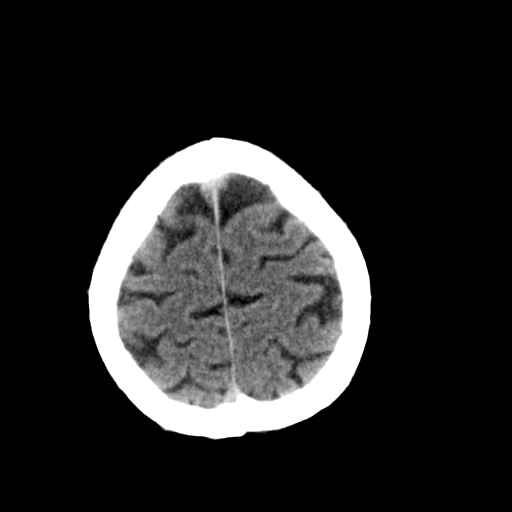
[im 27/36  bone]
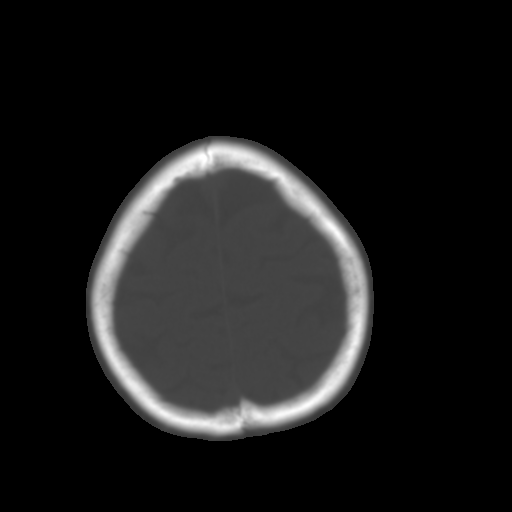
[im 29/36  brain]
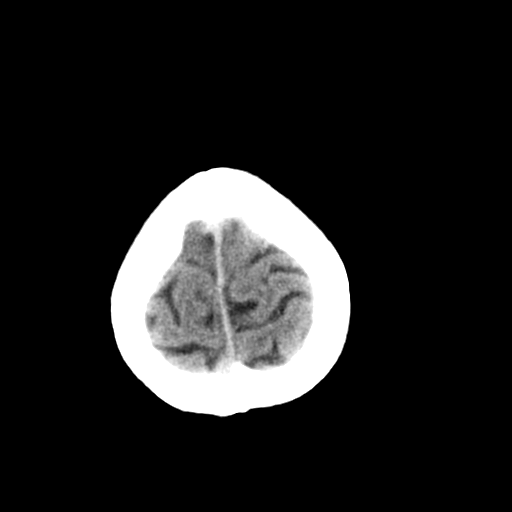
[im 32/36  brain]
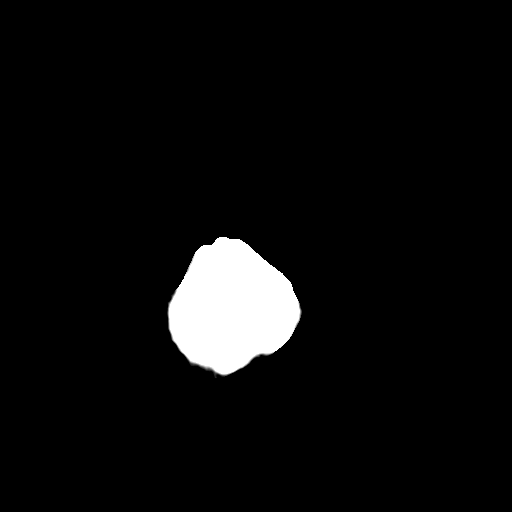
[im 34/36  brain]
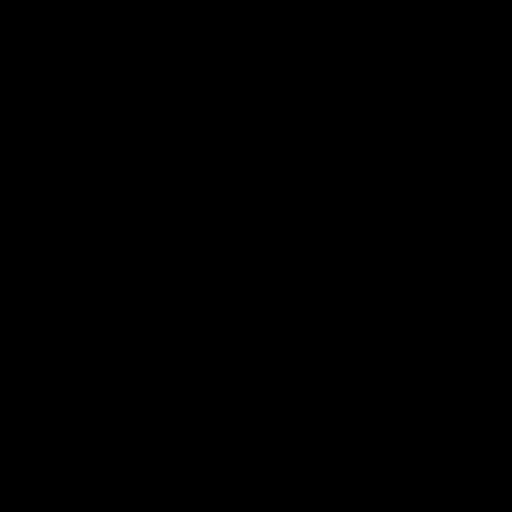

[16 of 30 positions shown; findings below may reference images not displayed]

FINDINGS: No intracranial hemorrhage, mass effect, or midline shift. No
hydrocephalus. The basilar cisterns are patent. No evidence of
territorial infarct. No intracranial fluid collection. Calvarium is
intact. Included paranasal sinuses and mastoid air cells are well
aerated.
IMPRESSION: No acute intracranial abnormality.

## 2017-04-19 ENCOUNTER — Ambulatory Visit
Admission: RE | Admit: 2017-04-19 | Discharge: 2017-04-19 | Disposition: A | Discharge status: Still In Hospital | Payer: Medicare Other | Source: Ambulatory Visit | Attending: Gastroenterology | Admitting: Gastroenterology

## 2017-04-19 ENCOUNTER — Ambulatory Visit: Admission: RE | Admit: 2017-04-19 | Payer: Medicare Other | Source: Ambulatory Visit | Admitting: Gastroenterology

## 2017-04-19 ENCOUNTER — Ambulatory Visit: Payer: Medicare Other | Admitting: Anesthesiology

## 2017-04-19 ENCOUNTER — Encounter
Admission: RE | Disposition: A | Discharge status: Still In Hospital | Payer: Self-pay | Source: Ambulatory Visit | Attending: Gastroenterology

## 2017-04-19 ENCOUNTER — Ambulatory Visit
Admission: RE | Admit: 2017-04-19 | Discharge: 2017-04-19 | Disposition: A | Discharge status: Home / Self Care | Payer: Medicare Other | Source: Ambulatory Visit | Attending: Gastroenterology | Admitting: Gastroenterology

## 2017-04-19 ENCOUNTER — Encounter
Admission: RE | Disposition: A | Discharge status: Home / Self Care | Payer: Self-pay | Source: Ambulatory Visit | Attending: Gastroenterology

## 2017-04-19 DIAGNOSIS — J9610 Chronic respiratory failure, unspecified whether with hypoxia or hypercapnia: Secondary | ICD-10-CM

## 2017-04-19 DIAGNOSIS — Z7902 Long term (current) use of antithrombotics/antiplatelets: Secondary | ICD-10-CM | POA: Diagnosis not present

## 2017-04-19 DIAGNOSIS — G1221 Amyotrophic lateral sclerosis: Secondary | ICD-10-CM | POA: Insufficient documentation

## 2017-04-19 DIAGNOSIS — Z9911 Dependence on respirator [ventilator] status: Secondary | ICD-10-CM | POA: Insufficient documentation

## 2017-04-19 DIAGNOSIS — R1310 Dysphagia, unspecified: Secondary | ICD-10-CM

## 2017-04-19 DIAGNOSIS — E119 Type 2 diabetes mellitus without complications: Secondary | ICD-10-CM | POA: Insufficient documentation

## 2017-04-19 DIAGNOSIS — K219 Gastro-esophageal reflux disease without esophagitis: Secondary | ICD-10-CM | POA: Insufficient documentation

## 2017-04-19 DIAGNOSIS — Z87891 Personal history of nicotine dependence: Secondary | ICD-10-CM

## 2017-04-19 DIAGNOSIS — Z79899 Other long term (current) drug therapy: Secondary | ICD-10-CM | POA: Insufficient documentation

## 2017-04-19 DIAGNOSIS — Z431 Encounter for attention to gastrostomy: Secondary | ICD-10-CM

## 2017-04-19 DIAGNOSIS — K9423 Gastrostomy malfunction: Secondary | ICD-10-CM | POA: Insufficient documentation

## 2017-04-19 DIAGNOSIS — F481 Depersonalization-derealization syndrome: Secondary | ICD-10-CM | POA: Insufficient documentation

## 2017-04-19 DIAGNOSIS — Z538 Procedure and treatment not carried out for other reasons: Secondary | ICD-10-CM | POA: Diagnosis not present

## 2017-04-19 DIAGNOSIS — Z930 Tracheostomy status: Secondary | ICD-10-CM | POA: Diagnosis not present

## 2017-04-19 HISTORY — PX: PEG PLACEMENT: SHX5437

## 2017-04-19 HISTORY — DX: Chronic respiratory failure, unspecified whether with hypoxia or hypercapnia: J96.10

## 2017-04-19 SURGERY — INSERTION, PEG TUBE
Anesthesia: General

## 2017-04-19 MED ORDER — FENTANYL CITRATE (PF) 100 MCG/2ML IJ SOLN
INTRAMUSCULAR | Status: AC
  Filled 2017-04-19: qty 2

## 2017-04-19 MED ORDER — MIDAZOLAM HCL 2 MG/2ML IJ SOLN
INTRAMUSCULAR | Status: DC | PRN
  Administered 2017-04-19: 2 mg via INTRAVENOUS

## 2017-04-19 MED ORDER — SODIUM CHLORIDE 0.9 % IV SOLN
INTRAVENOUS | Status: DC
  Administered 2017-04-19: 11:00:00 via INTRAVENOUS

## 2017-04-19 MED ORDER — PROPOFOL 500 MG/50ML IV EMUL
INTRAVENOUS | Status: DC | PRN
  Administered 2017-04-19: 140 ug/kg/min via INTRAVENOUS

## 2017-04-19 MED ORDER — LIDOCAINE HCL (PF) 2 % IJ SOLN
INTRAMUSCULAR | Status: AC
  Filled 2017-04-19: qty 2

## 2017-04-19 MED ORDER — LIDOCAINE HCL (CARDIAC) 20 MG/ML IV SOLN
INTRAVENOUS | Status: DC | PRN
  Administered 2017-04-19: 30 mg via INTRAVENOUS

## 2017-04-19 MED ORDER — PROPOFOL 500 MG/50ML IV EMUL
INTRAVENOUS | Status: AC
  Filled 2017-04-19: qty 50

## 2017-04-19 MED ORDER — MIDAZOLAM HCL 2 MG/2ML IJ SOLN
INTRAMUSCULAR | Status: AC
Start: 2017-04-19 — End: ?
  Filled 2017-04-19: qty 2

## 2017-04-19 MED ORDER — PROPOFOL 500 MG/50ML IV EMUL
INTRAVENOUS | Status: DC | PRN
  Administered 2017-04-19: 100 ug/kg/min via INTRAVENOUS

## 2017-04-19 MED ORDER — FENTANYL CITRATE (PF) 100 MCG/2ML IJ SOLN
INTRAMUSCULAR | Status: DC | PRN
  Administered 2017-04-19 (×2): 50 ug via INTRAVENOUS

## 2017-04-19 MED ORDER — FENTANYL CITRATE (PF) 100 MCG/2ML IJ SOLN
50.0000 ug | INTRAMUSCULAR | Status: DC | PRN
  Administered 2017-04-19: 50 ug via INTRAVENOUS

## 2017-04-19 MED ORDER — SODIUM CHLORIDE 0.9 % IV SOLN
INTRAVENOUS | Status: DC | PRN
  Administered 2017-04-19: 13:00:00 via INTRAVENOUS

## 2017-04-19 NOTE — Anesthesia Procedure Notes (Signed)
Performed by: Tonia GhentOOK-MARTIN, Terecia Plaut Pre-anesthesia Checklist: Patient identified, Emergency Drugs available, Suction available, Patient being monitored and Timeout performed Patient Re-evaluated:Patient Re-evaluated prior to induction Preoxygenation: Pre-oxygenation with 100% oxygen Induction Type: IV induction and Tracheostomy Airway Equipment and Method: Patient positioned with wedge pillow and Tracheostomy Placement Confirmation: positive ETCO2 and CO2 detector

## 2017-04-19 NOTE — Op Note (Signed)
[facility] Patient Name: Randy Roach Procedure Date: 04/19/2017 12:36 PM MRN: [id number] Account #: [id number] Date of Birth: 11-Mar-1976 Admit Type: Outpatient Age: 41 Room: [facility] ENDO ROOM 3 Gender: Male Note Status: Finalized Procedure:            Upper GI endoscopy Indications:          Replace PEG tube due to clogged gastrostomy tube Providers:            Jonathon Bellows MD, MD Referring MD:         No Local Md, MD (Referring MD) Medicines:            Monitored Anesthesia Care Complications:        No immediate complications. Procedure:            Pre-Anesthesia Assessment:                       - Prior to the procedure, a History and Physical was                        performed, and patient medications, allergies and                        sensitivities were reviewed. The patient's tolerance of                        previous anesthesia was reviewed.                       - The risks and benefits of the procedure and the                        sedation options and risks were discussed with the                        patient. All questions were answered and informed                        consent was obtained.                       - The risks and benefits of the procedure and the                        sedation options and risks were discussed with the                        patient. All questions were answered and informed                        consent was obtained.                       After obtaining informed consent, the endoscope was                        passed under direct vision. Throughout the procedure,                        the patient's blood pressure, pulse, and oxygen  saturations were monitored continuously. The Endoscope                        was introduced through the mouth, and advanced to the                        duodenal bulb. The upper GI endoscopy was accomplished                        with  ease. The patient tolerated the procedure well. Findings:      The old peg tube was pulled out externally and a [employer] 57F       replacement KIt with a straight Bolster was used. Position confirmed       endoscpically      The esophagus was normal.      The stomach was normal. Impression:           - Normal esophagus.                       - Normal stomach.                       - No specimens collected. Recommendation:       - Discharge patient to home (with escort).                       - Please follow the post-PEG recommendations including:                        external bolster snug to abdominal wall, change                        dressing once per day and check site for bleeding q 4                        hrs. Procedure Code(s):    --- Professional ---                       [phone], Esophagogastroduodenoscopy, flexible, transoral;                        diagnostic, including collection of specimen(s) by                        brushing or washing, when performed (separate procedure) Diagnosis Code(s):    --- Professional ---                       Z43.1, Encounter for attention to gastrostomy CPT copyright 2016 American Medical Association. All rights reserved. The codes documented in this report are preliminary and upon coder review may  be revised to meet current compliance requirements. Jonathon Bellows, MD Jonathon Bellows MD, MD 04/19/2017 1:02:10 PM This report has been signed electronically. Number of Addenda: 0 Note Initiated On: 04/19/2017 12:36 PM      [facility]

## 2017-04-19 NOTE — Anesthesia Preprocedure Evaluation (Signed)
Anesthesia Evaluation  Patient identified by MRN, date of birth, ID band Patient awake    Reviewed: Allergy & Precautions, H&P , NPO status , Patient's Chart, lab work & pertinent test results, reviewed documented beta blocker date and time   Airway       Comment: Patient has trach in place and is on ventilator Dental   Pulmonary former smoker,  Trach and on respirator          Cardiovascular Exercise Tolerance: Good negative cardio ROS       Neuro/Psych ALS negative psych ROS   GI/Hepatic Neg liver ROS, GERD  ,  Endo/Other  diabetes  Renal/GU negative Renal ROS  negative genitourinary   Musculoskeletal   Abdominal   Peds  Hematology negative hematology ROS (+)   Anesthesia Other Findings Past Medical History: No date: ALS (amyotrophic lateral sclerosis) (HCC) No date: Diabetes mellitus without complication (HCC) No date: Respiratory failure, chronic (HCC)  Plan is to do this without sedation, but we have discussed propofol infusion if it is needed.  Reproductive/Obstetrics negative OB ROS                             Anesthesia Physical  Anesthesia Plan  ASA: IV  Anesthesia Plan: General   Post-op Pain Management:    Induction:   PONV Risk Score and Plan:   Airway Management Planned:   Additional Equipment:   Intra-op Plan:   Post-operative Plan:   Informed Consent: I have reviewed the patients History and Physical, chart, labs and discussed the procedure including the risks, benefits and alternatives for the proposed anesthesia with the patient or authorized representative who has indicated his/her understanding and acceptance.   Dental Advisory Given  Plan Discussed with: Anesthesiologist, CRNA and Surgeon  Anesthesia Plan Comments:         Anesthesia Quick Evaluation  

## 2017-04-19 NOTE — Anesthesia Preprocedure Evaluation (Signed)
Anesthesia Evaluation  Patient identified by MRN, date of birth, ID band Patient awake    Reviewed: Allergy & Precautions, H&P , NPO status , Patient's Chart, lab work & pertinent test results, reviewed documented beta blocker date and time   Airway       Comment: Patient has trach in place and is on ventilator Dental   Pulmonary former smoker,  Insurance underwriterTrach and on respirator          Cardiovascular Exercise Tolerance: Good negative cardio ROS       Neuro/Psych ALS negative psych ROS   GI/Hepatic Neg liver ROS, GERD  ,  Endo/Other  diabetes  Renal/GU negative Renal ROS  negative genitourinary   Musculoskeletal   Abdominal   Peds  Hematology negative hematology ROS (+)   Anesthesia Other Findings Past Medical History: No date: ALS (amyotrophic lateral sclerosis) (HCC) No date: Diabetes mellitus without complication (HCC) No date: Respiratory failure, chronic (HCC)  Plan is to do this without sedation, but we have discussed propofol infusion if it is needed.  Reproductive/Obstetrics negative OB ROS                             Anesthesia Physical  Anesthesia Plan  ASA: IV  Anesthesia Plan: General   Post-op Pain Management:    Induction:   PONV Risk Score and Plan:   Airway Management Planned:   Additional Equipment:   Intra-op Plan:   Post-operative Plan:   Informed Consent: I have reviewed the patients History and Physical, chart, labs and discussed the procedure including the risks, benefits and alternatives for the proposed anesthesia with the patient or authorized representative who has indicated his/her understanding and acceptance.   Dental Advisory Given  Plan Discussed with: Anesthesiologist, CRNA and Surgeon  Anesthesia Plan Comments:         Anesthesia Quick Evaluation

## 2017-04-19 NOTE — H&P (Signed)
Jonathon Bellows MD [address], [place], [place] [address] Phone: [phone] Fax : [phone]  Primary Care Physician:  Montel Clock, MD Primary Gastroenterologist:  Dr. Jonathon Bellows   Pre-Procedure History & Physical: HPI:  Randy Roach is a 41 y.o. male is here for an endoscopy.   Past Medical History:  Diagnosis Date  . ALS (amyotrophic lateral sclerosis) ([place])   . Diabetes mellitus without complication ([place])   . Respiratory failure, chronic (HCC)     Past Surgical History:  Procedure Laterality Date  . APPENDECTOMY    . fractured hands    . PEG TUBE PLACEMENT Left   . TRACHEOSTOMY      Prior to Admission medications   Medication Sig Start Date End Date Taking? Authorizing Provider  acetaminophen (TYLENOL) 325 MG tablet Take 2 tablets (650 mg total) by mouth every 6 (six) hours as needed for mild pain (or Fever >/= 101). 05/11/16  Yes Kasa, Maretta Bees, MD  amitriptyline (ELAVIL) 25 MG tablet Take 25 mg by mouth at bedtime. Patient is taking a suspension. 12/24/15  Yes [provider]  baclofen (LIORESAL) 10 MG tablet Place 10 mg into feeding tube every 8 (eight) hours as needed for muscle spasms.   Yes [provider]  bisacodyl (DULCOLAX) 10 MG suppository Place 10 mg rectally as needed for moderate constipation.   Yes [provider]  busPIRone (BUSPAR) 10 MG tablet Place 10 mg into feeding tube 3 (three) times daily.   Yes [provider]  chlorhexidine gluconate, SAGE KIT, (PERIDEX) 0.12 % solution 15 mLs by Mouth Rinse route 2 (two) times daily. 05/11/16  Yes Kasa, Maretta Bees, MD  clonazePAM (KLONOPIN) 0.5 MG tablet Place 0.5 mg into feeding tube every 12 (twelve) hours as needed for anxiety.   Yes [provider]  DULoxetine (CYMBALTA) 60 MG capsule Take 60 mg by mouth daily. In peg tube   Yes [provider]  Nutritional Supplements (FEEDING SUPPLEMENT, OSMOLITE 1.5 CAL,) LIQD Place 237 mLs into feeding tube every  4 (four) hours. 12/12/16  Yes Gladstone Lighter, MD  Oxycodone HCl 10 MG TABS Place 10 mg into feeding tube every 4 (four) hours as needed.   Yes [provider]  Oxycodone HCl 20 MG TABS 20 mg by PEG Tube route every 6 (six) hours as needed.   Yes [provider]  polyethylene glycol (MIRALAX / GLYCOLAX) packet Place 17 g into feeding tube daily.   Yes [provider]  ranitidine (ZANTAC) 150 MG tablet Take 150 mg by mouth 2 (two) times daily.   Yes [provider]  riluzole (RILUTEK) 50 MG tablet Place 50 mg into feeding tube 2 (two) times daily.    Yes [provider]  scopolamine (TRANSDERM-SCOP) 1 MG/3DAYS Place 1 patch (1.5 mg total) onto the skin every 3 (three) days. 12/14/16  Yes Gladstone Lighter, MD  Selenium Sulfide 2.25 % SHAM Apply 1 application topically 2 (two) times a week. Monday and Thursday   Yes [provider]  Skin Protectants, Misc. (EUCERIN) cream Apply 1 application topically 2 (two) times daily.   Yes [provider]  sodium phosphate (FLEET) 7-19 GM/118ML ENEM Place 133 mLs (1 enema total) rectally once as needed for severe constipation. 12/12/16  Yes Gladstone Lighter, MD  tiZANidine (ZANAFLEX) 4 MG tablet Place 4 mg into feeding tube every 8 (eight) hours as needed for muscle spasms.   Yes [provider]  Water For Irrigation, Sterile (FREE WATER) SOLN  Place 200 mLs into feeding tube every 8 (eight) hours. 12/12/16  Yes Gladstone Lighter, MD  amoxicillin-clavulanate (AUGMENTIN) 875-125 MG tablet Place 1 tablet into feeding tube 2 (two) times daily. X 7 more days Patient not taking: Reported on 04/19/2017 12/12/16   Gladstone Lighter, MD  enoxaparin (LOVENOX) 40 MG/0.4ML injection Inject 40 mg into the skin daily.    [provider]  ipratropium-albuterol (DUONEB) 0.5-2.5 (3) MG/3ML SOLN Take 3 mLs by nebulization every 4 (four) hours as needed. Patient not taking: Reported on 12/08/2016  05/11/16   Flora Lipps, MD    Allergies as of 03/27/2017  . (No Known Allergies)    History reviewed. No pertinent family history.  Social History   Social History  . Marital status: Divorced    Spouse name: N/A  . Number of children: N/A  . Years of education: N/A   Occupational History  . Not on file.   Social History Main Topics  . Smoking status: Former Smoker    Packs/day: 1.00    Types: Cigarettes  . Smokeless tobacco: Never Used  . Alcohol use No     Comment: occ  . Drug use: No  . Sexual activity: Not on file   Other Topics Concern  . Not on file   Social History Narrative  . No narrative on file    Review of Systems: See HPI, otherwise negative ROS  Physical Exam: BP 109/81   Pulse (!) 114   Temp 98.1 F (36.7 C) (Tympanic)   Resp 18   Ht _0  (1.702 m)   Wt 110 lb (49.9 kg)   SpO2 100%   BMI 17.23 kg/m  General:   On vent cannot communicate  Head:  Normocephalic and atraumatic. Neck:  Supple; no masses or thyromegaly. Lungs:  Clear throughout to auscultation.  Vent/trac  Heart:  Regular rate and rhythm. Abdomen:  PEG tube noted Soft, nontender and nondistended. Normal bowel sounds, without guarding, and without rebound.   Neurologic:  Alert and  oriented x0  Impression/Plan: Randy Roach is here for an endoscopy to be performed for change of PEG tube   Risks, benefits, limitations, and alternatives regarding  endoscopy have been reviewed with the family .  Questions have been answered.  All parties agreeable.   Jonathon Bellows, MD  04/19/2017, 11:14 AM

## 2017-04-19 NOTE — Anesthesia Procedure Notes (Signed)
Performed by: Tonia GhentOOK-MARTIN, Camdyn Laden Pre-anesthesia Checklist: Patient identified, Emergency Drugs available, Suction available, Patient being monitored and Timeout performed Patient Re-evaluated:Patient Re-evaluated prior to induction Preoxygenation: Pre-oxygenation with 100% oxygen Induction Type: IV induction Airway Equipment and Method: Tracheostomy Placement Confirmation: positive ETCO2 and CO2 detector

## 2017-04-19 NOTE — Transfer of Care (Signed)
Immediate Anesthesia Transfer of Care Note  Patient: Randy Roach  Procedure(s) Performed: Procedure(s): PERCUTANEOUS ENDOSCOPIC GASTROSTOMY (PEG) REPLACEMENT (N/A)  Patient Location: PACU and ICU  Anesthesia Type:General  Level of Consciousness: awake and sedated  Airway & Oxygen Therapy: Patient Spontanous Breathing and Patient connected to nasal cannula oxygen  Post-op Assessment: Report given to RN and Post -op Vital signs reviewed and stable  Post vital signs: Reviewed and stable  Last Vitals:  Vitals:   04/19/17 1049  BP: 109/81  Pulse: (!) 114  Resp: 18  Temp: 36.7 C    Last Pain:  Vitals:   04/19/17 1049  TempSrc: Tympanic         Complications: No apparent anesthesia complications

## 2017-04-19 NOTE — Op Note (Signed)
[facility] Gastroenterology Patient Name: Randy Roach Procedure Date: 04/19/2017 11:22 AM MRN: [id number] Account #: [id number] Date of Birth: 08/06/1976 Admit Type: Outpatient Age: [age] [facility] 3 Gender: Male Note Status: Finalized Procedure:            Upper GI endoscopy Indications:          Dysphagia Providers:            Wyline MoodKiran Mckinze Poirier MD, MD Referring MD:         No Local Md, MD (Referring MD) Medicines:            Monitored Anesthesia Care Complications:        No immediate complications. Procedure:            Pre-Anesthesia Assessment:                       - ASA Grade Assessment: IV - A patient with severe                        systemic disease that is a constant threat to life.                       After obtaining informed consent, the endoscope was                        passed under direct vision. Throughout the procedure,                        the patient's blood pressure, pulse, and oxygen                        saturations were monitored continuously. The procedure                        was aborted. The scope was not inserted. Medications                        were given. The upper GI endoscopy was somewhat                        difficult due to my inability to identify the upper                        esophageal spincter The procedure was aborted. Findings:      Unable to visualize the vocal cords or the upper esophageal spincter Impression:           - The procedure was aborted.                       -                       - No specimens collected. Recommendation:       - I will plan to reattempt the procedure with Dr Servando SnareWohl                        later today . Diagnosis Code(s):    --- Professional ---                       Z53.8, Procedure and  treatment not carried out for                        other reasons                       R13.10, Dysphagia, unspecified Wyline Mood, MD Wyline Mood MD, MD 04/19/2017 11:51:28 AM This  report has been signed electronically. Number of Addenda: 0 Note Initiated On: 04/19/2017 11:22 AM      [facility]

## 2017-04-19 NOTE — H&P (Signed)
Jonathon Bellows MD [address], [place], [place] [address] Phone: [phone] Fax : [phone]  Primary Care Physician:  Montel Clock, MD Primary Gastroenterologist:  Dr. Jonathon Bellows   Pre-Procedure History & Physical: HPI:  ROME ECHAVARRIA is a 41 y.o. male is here for an endoscopy.   Past Medical History:  Diagnosis Date  . ALS (amyotrophic lateral sclerosis) ([place])   . Diabetes mellitus without complication ([place])   . Respiratory failure, chronic (HCC)     Past Surgical History:  Procedure Laterality Date  . APPENDECTOMY    . fractured hands    . PEG TUBE PLACEMENT Left   . TRACHEOSTOMY      Prior to Admission medications   Medication Sig Start Date End Date Taking? Authorizing Provider  acetaminophen (TYLENOL) 325 MG tablet Take 2 tablets (650 mg total) by mouth every 6 (six) hours as needed for mild pain (or Fever >/= 101). 05/11/16   Flora Lipps, MD  amitriptyline (ELAVIL) 25 MG tablet Take 25 mg by mouth at bedtime. Patient is taking a suspension. 12/24/15   [provider]  amoxicillin-clavulanate (AUGMENTIN) 875-125 MG tablet Place 1 tablet into feeding tube 2 (two) times daily. X 7 more days Patient not taking: Reported on 04/19/2017 12/12/16   Gladstone Lighter, MD  baclofen (LIORESAL) 10 MG tablet Place 10 mg into feeding tube every 8 (eight) hours as needed for muscle spasms.    [provider]  bisacodyl (DULCOLAX) 10 MG suppository Place 10 mg rectally as needed for moderate constipation.    [provider]  busPIRone (BUSPAR) 10 MG tablet Place 10 mg into feeding tube 3 (three) times daily.    [provider]  chlorhexidine gluconate, SAGE KIT, (PERIDEX) 0.12 % solution 15 mLs by Mouth Rinse route 2 (two) times daily. 05/11/16   Flora Lipps, MD  clonazePAM (KLONOPIN) 0.5 MG tablet Place 0.5 mg into feeding tube every 12 (twelve) hours as needed for anxiety.    [provider]  DULoxetine (CYMBALTA) 60 MG capsule  Take 60 mg by mouth daily. In peg tube    [provider]  enoxaparin (LOVENOX) 40 MG/0.4ML injection Inject 40 mg into the skin daily.    [provider]  ipratropium-albuterol (DUONEB) 0.5-2.5 (3) MG/3ML SOLN Take 3 mLs by nebulization every 4 (four) hours as needed. Patient not taking: Reported on 12/08/2016 05/11/16   Flora Lipps, MD  Nutritional Supplements (FEEDING SUPPLEMENT, OSMOLITE 1.5 CAL,) LIQD Place 237 mLs into feeding tube every 4 (four) hours. 12/12/16   Gladstone Lighter, MD  Oxycodone HCl 10 MG TABS Place 10 mg into feeding tube every 4 (four) hours as needed.    [provider]  Oxycodone HCl 20 MG TABS 20 mg by PEG Tube route every 6 (six) hours as needed.    [provider]  polyethylene glycol (MIRALAX / GLYCOLAX) packet Place 17 g into feeding tube daily.    [provider]  ranitidine (ZANTAC) 150 MG tablet Take 150 mg by mouth 2 (two) times daily.    [provider]  riluzole (RILUTEK) 50 MG tablet Place 50 mg into feeding tube 2 (two) times daily.     [provider]  scopolamine (TRANSDERM-SCOP) 1 MG/3DAYS Place 1 patch (1.5 mg total) onto the skin every 3 (three) days. 12/14/16   Gladstone Lighter, MD  Selenium Sulfide 2.25 % SHAM Apply 1 application topically 2 (two) times a week. Monday and Thursday    [provider]  Skin Protectants, Misc. (EUCERIN) cream Apply 1 application topically 2 (two) times daily.    [provider]  sodium phosphate (FLEET) 7-19 GM/118ML ENEM Place 133 mLs (1 enema total) rectally once as needed for severe constipation. 12/12/16   Gladstone Lighter, MD  tiZANidine (ZANAFLEX) 4 MG tablet Place 4 mg into feeding tube every 8 (eight) hours as needed for muscle spasms.    [provider]  Water For Irrigation, Sterile (FREE WATER) SOLN Place 200 mLs into feeding tube every 8 (eight) hours. 12/12/16   Gladstone Lighter, MD    Allergies as of 04/19/2017  .  (No Known Allergies)    No family history on file.  Social History   Social History  . Marital status: Divorced    Spouse name: N/A  . Number of children: N/A  . Years of education: N/A   Occupational History  . Not on file.   Social History Main Topics  . Smoking status: Former Smoker    Packs/day: 1.00    Types: Cigarettes  . Smokeless tobacco: Never Used  . Alcohol use No     Comment: occ  . Drug use: No  . Sexual activity: Not on file   Other Topics Concern  . Not on file   Social History Narrative  . No narrative on file    Review of Systems: See HPI, otherwise negative ROS  Physical Exam: There were no vitals taken for this visit. General:   Alert, Head:  Normocephalic and atraumatic. Neck:  Supple; no masses or thyromegaly. Lungs:  Clear throughout to auscultation.    Heart:  Regular rate and rhythm. Abdomen:  Soft, nontender and nondistended. Normal bowel sounds, without guarding, and without rebound.   Neurologic:  Alert and  oriented x0  Impression/Plan: TYRAE ALCOSER is here for an endoscopy to be performed for G tube replacement   Risks, benefits, limitations, and alternatives regarding  endoscopy have been reviewed with the patient.  Questions have been answered.  All parties agreeable.   Jonathon Bellows, MD  04/19/2017, 1:04 PM

## 2017-04-19 NOTE — Anesthesia Post-op Follow-up Note (Cosign Needed)
Anesthesia QCDR form completed.        

## 2017-04-19 NOTE — Transfer of Care (Signed)
Immediate Anesthesia Transfer of Care Note  Patient: Randy Roach  Procedure(s) Performed: Procedure(s): PERCUTANEOUS ENDOSCOPIC GASTROSTOMY (PEG) PLACEMENT (N/A)  Patient Location: PACU  Anesthesia Type:General  Level of Consciousness: awake and sedated  Airway & Oxygen Therapy: Patient Spontanous Breathing and Patient connected to nasal cannula oxygen  Post-op Assessment: Report given to RN and Post -op Vital signs reviewed and stable  Post vital signs: Reviewed and stable  Last Vitals: There were no vitals filed for this visit.  Last Pain: There were no vitals filed for this visit.       Complications: No apparent anesthesia complications

## 2017-04-20 ENCOUNTER — Encounter: Payer: Self-pay | Admitting: Gastroenterology

## 2017-04-22 NOTE — Addendum Note (Signed)
Addendum  created 04/22/17 [id number] by Lenard SimmerKarenz, Charliegh Vasudevan, MD   Sign clinical note

## 2017-04-22 NOTE — Anesthesia Postprocedure Evaluation (Signed)
Anesthesia Post Note  Patient: Randy Roach  Procedure(s) Performed: Procedure(s) (LRB): PERCUTANEOUS ENDOSCOPIC GASTROSTOMY (PEG) PLACEMENT (N/A)  Patient location during evaluation: Endoscopy Anesthesia Type: General Level of consciousness: awake and alert Pain management: pain level controlled Vital Signs Assessment: post-procedure vital signs reviewed and stable Respiratory status: respiratory function stable and patient on ventilator - see flowsheet for VS (Patient with ALS on home vent) Cardiovascular status: blood pressure returned to baseline and stable Postop Assessment: no signs of nausea or vomiting Anesthetic complications: no     Last Vitals:  Vitals:   04/19/17 1305 04/19/17 1335  BP: 124/88 110/85  Pulse: (!) 104   Resp: 18   Temp: 37.7 C     Last Pain:  Vitals:   04/19/17 1305  TempSrc: Tympanic                 Lenard SimmerAndrew Kenny Rea

## 2017-04-22 NOTE — Anesthesia Postprocedure Evaluation (Addendum)
Anesthesia Post Note  Patient: Randy Roach  Procedure(s) Performed: Procedure(s) (LRB): PERCUTANEOUS ENDOSCOPIC GASTROSTOMY (PEG) REPLACEMENT (N/A)  Patient location during evaluation: Endoscopy Anesthesia Type: General Level of consciousness: awake and alert Pain management: pain level controlled Vital Signs Assessment: post-procedure vital signs reviewed and stable Respiratory status: respiratory function stable and patient on ventilator - see flowsheet for VS (Patient on home vent) Cardiovascular status: blood pressure returned to baseline and stable Postop Assessment: no signs of nausea or vomiting Anesthetic complications: no Comments: Patient is to have a second attempt at the same procedure.  He has recovered from his first anesthetic at this time.     Last Vitals:  Vitals:   04/19/17 1159 04/19/17 1209  BP: 107/82 112/87  Pulse: (!) 112 (!) 105  Resp: 16 16  Temp: 36.9 C     Last Pain:  Vitals:   04/20/17 0734  TempSrc:   PainSc: 0-No pain                 Lenard SimmerAndrew Curstin Schmale

## 2017-10-26 DIAGNOSIS — R845 Abnormal microbiological findings in specimens from respiratory organs and thorax: Secondary | ICD-10-CM | POA: Insufficient documentation

## 2017-12-10 ENCOUNTER — Encounter: Payer: Self-pay | Admitting: Emergency Medicine

## 2017-12-10 ENCOUNTER — Other Ambulatory Visit: Payer: Self-pay

## 2017-12-10 ENCOUNTER — Emergency Department
Admission: EM | Admit: 2017-12-10 | Discharge: 2017-12-10 | Disposition: A | Discharge status: Home / Self Care | Payer: Medicare Other | Attending: Emergency Medicine | Admitting: Emergency Medicine

## 2017-12-10 ENCOUNTER — Emergency Department: Payer: Medicare Other

## 2017-12-10 DIAGNOSIS — Z87891 Personal history of nicotine dependence: Secondary | ICD-10-CM | POA: Diagnosis not present

## 2017-12-10 DIAGNOSIS — E119 Type 2 diabetes mellitus without complications: Secondary | ICD-10-CM | POA: Diagnosis not present

## 2017-12-10 DIAGNOSIS — T85598A Other mechanical complication of other gastrointestinal prosthetic devices, implants and grafts, initial encounter: Secondary | ICD-10-CM

## 2017-12-10 DIAGNOSIS — Y731 Therapeutic (nonsurgical) and rehabilitative gastroenterology and urology devices associated with adverse incidents: Secondary | ICD-10-CM | POA: Insufficient documentation

## 2017-12-10 MED ORDER — IOPAMIDOL (ISOVUE-300) INJECTION 61%
50.0000 mL | Freq: Once | INTRAVENOUS | Status: AC | PRN
  Administered 2017-12-10: 50 mL

## 2017-12-10 NOTE — ED Provider Notes (Signed)
[facility] Emergency Department Provider Note       Time seen: ----------------------------------------- 8:15 AM on 12/10/2017 -----------------------------------------   I have reviewed the triage vital signs and the nursing notes.  HISTORY   Chief Complaint G tube cracked    HPI Randy Roach is a 42 y.o. male with a history of ALS, diabetes and chronic respiratory failure who presents to the ED for G-tube replacement.  Caregiver reports that his G-tube cracked several days ago.  He is on a vent with trach from home.  He has not had any other issues according to his caregiver.  Past Medical History:  Diagnosis Date  . ALS (amyotrophic lateral sclerosis) (HCC)   . Diabetes mellitus without complication (HCC)   . Respiratory failure, chronic [facility](HCC)     Patient Active Problem List   Diagnosis Date Noted  . Left lower lobe pneumonia (HCC) 12/08/2016  . Hyperglycemia 12/08/2016  . Anemia 12/08/2016  . Sepsis due to pneumonia (HCC) 05/08/2016    Past Surgical History:  Procedure Laterality Date  . APPENDECTOMY    . fractured hands    . PEG PLACEMENT N/A 04/19/2017   Procedure: PERCUTANEOUS ENDOSCOPIC GASTROSTOMY (PEG) REPLACEMENT;  Surgeon: Wyline MoodAnna, Kiran, MD;  [facility];  Service: Endoscopy;  Laterality: N/A;  . PEG PLACEMENT N/A 04/19/2017   Procedure: PERCUTANEOUS ENDOSCOPIC GASTROSTOMY (PEG) PLACEMENT;  Surgeon: Wyline MoodAnna, Kiran, MD;  [facility];  Service: Gastroenterology;  Laterality: N/A;  . PEG TUBE PLACEMENT Left   . TRACHEOSTOMY      Allergies Patient has no known allergies.  Social History Social History   Tobacco Use  . Smoking status: Former Smoker    Packs/day: 1.00    Types: Cigarettes  . Smokeless tobacco: Never Used  Substance Use Topics  . Alcohol use: No    Comment: occ  . Drug use: No   Review of Systems Constitutional: Negative for fever. Cardiovascular: Negative for chest  pain. Respiratory: Negative for shortness of breath. Gastrointestinal: Positive for G-tube dysfunction Skin: Negative for rash. Neurological: Negative for headaches, focal weakness or numbness.  All systems negative/normal/unremarkable except as stated in the HPI  ____________________________________________   PHYSICAL EXAM:  VITAL SIGNS: ED Triage Vitals [12/10/17 0802]  Enc Vitals Group     BP      Pulse      Resp      Temp      Temp src      SpO2      Weight 110 lb (49.9 kg)     Height      Head Circumference      Peak Flow      Pain Score      Pain Loc      Pain Edu?      Excl. in GC?    Constitutional: Alert, no distress ENT   Neck: No stridor.  Randy Roach is in place at this time Cardiovascular: Normal rate, regular rhythm. No murmurs, rubs, or gallops. Respiratory: Normal respiratory effort without tachypnea nor retractions. Breath sounds are clear and equal bilaterally. No wheezes/rales/rhonchi. Gastrointestinal: Soft and nontender.  PEG tube site appears unremarkable.  There is a crack in the proximal aspect of the G-tube Musculoskeletal: Patient has movement in the lower extremities but not in the upper extremities Neurologic:  No gross focal neurologic deficits are appreciated.  Skin:  Skin is warm, dry and intact. No rash noted. ____________________________________________  ED COURSE:  As part of my medical decision making, I  reviewed the following data within the electronic MEDICAL RECORD NUMBER History obtained from family if available, nursing notes, old chart and ekg, as well as notes from prior ED visits. Patient presented for G-tube dysfunction, will replace the G-tube and obtain a KUB with Gastrografin   FEEDING TUBE REPLACEMENT Date/Time: 12/10/2017 8:19 AM Performed by: Emily Filbert, MD Authorized by: Emily Filbert, MD  Consent: Verbal consent obtained. Consent given by: patient Patient understanding: patient states understanding of the  procedure being performed Indications: tube cracked Local anesthesia used: no  Anesthesia: Local anesthesia used: no  Sedation: Patient sedated: no  Tube type: gastrostomy Patient position: supine Tube size: 18 Fr Endoscope used: no Bulb inflation volume: 15 (ml) Bulb inflation fluid: normal saline Placement/position confirmation: x-ray Tube placement difficulty: none Patient tolerance: Patient tolerated the procedure well with no immediate complications    ____________________________________________   RADIOLOGY Images were viewed by me KUB  IMPRESSION: Gastrostomy tube tip appears to be well positioned within gastric lumen. There is no evidence of bowel obstruction or ileus. ____________________________________________  DIFFERENTIAL DIAGNOSIS   Feeding tube replacement, feeding tube dislodgment, dysfunction, ALS  FINAL ASSESSMENT AND PLAN  Feeding tube replacement  Plan: The patient had presented for feeding tube replacement. Patient's imaging did confirm proper G-tube replacement.  He is stable for outpatient follow-up.   Ulice Dash, MD   Note: This note was generated in part or whole with voice recognition software. Voice recognition is usually quite accurate but there are transcription errors that can and very often do occur. I apologize for any typographical errors that were not detected and corrected.     Emily Filbert, MD 12/10/17 [phone]

## 2017-12-10 NOTE — ED Triage Notes (Signed)
G tube cracked.  Arrived on vent with trach from home. Caregiver present.  No needs other than g tube replacement

## 2017-12-10 NOTE — ED Notes (Signed)
[facility] called for family request to fill out HCPOA paperwork here

## 2017-12-10 NOTE — ED Notes (Signed)
Pt stable on discharge. Leaving with EMS.

## 2017-12-10 NOTE — Progress Notes (Signed)
Chaplain responded to page at 08:34 and visited patient and aunt, Okey RegalCarol, at 08:41 for the completion of a Health Care Power of [id number]. Patient was not able to communicate verbally and Chaplain established a Yes/No protocol for answering HCPOA questions by blinking. After education, arranging for volunteers, the ED notaries did not feel comfortable completing the HCPOA.  The Chaplain gave Okey RegalCarol the incomplete AD and suggested completion outside of [place]. Further, Okey RegalCarol was instructed  To [facility] for inclusion in the patient's record. The patient was visibly upset and the Chaplain offered prayer and spiritual support.

## 2017-12-10 NOTE — ED Notes (Signed)
Waiting on transport back home via EMS

## 2017-12-10 NOTE — ED Notes (Signed)
g tube replaced by dr Mayford Knifewilliams.

## 2018-03-19 IMAGING — CR DG ABDOMEN 1V
1 series · 1 of 1 positions shown · non-contrast
Comparison: 11/26/2015

CLINICAL DATA: Gastrostomy tube evaluation

EXAM:
ABDOMEN - 1 VIEW

[dg abd 1 view]
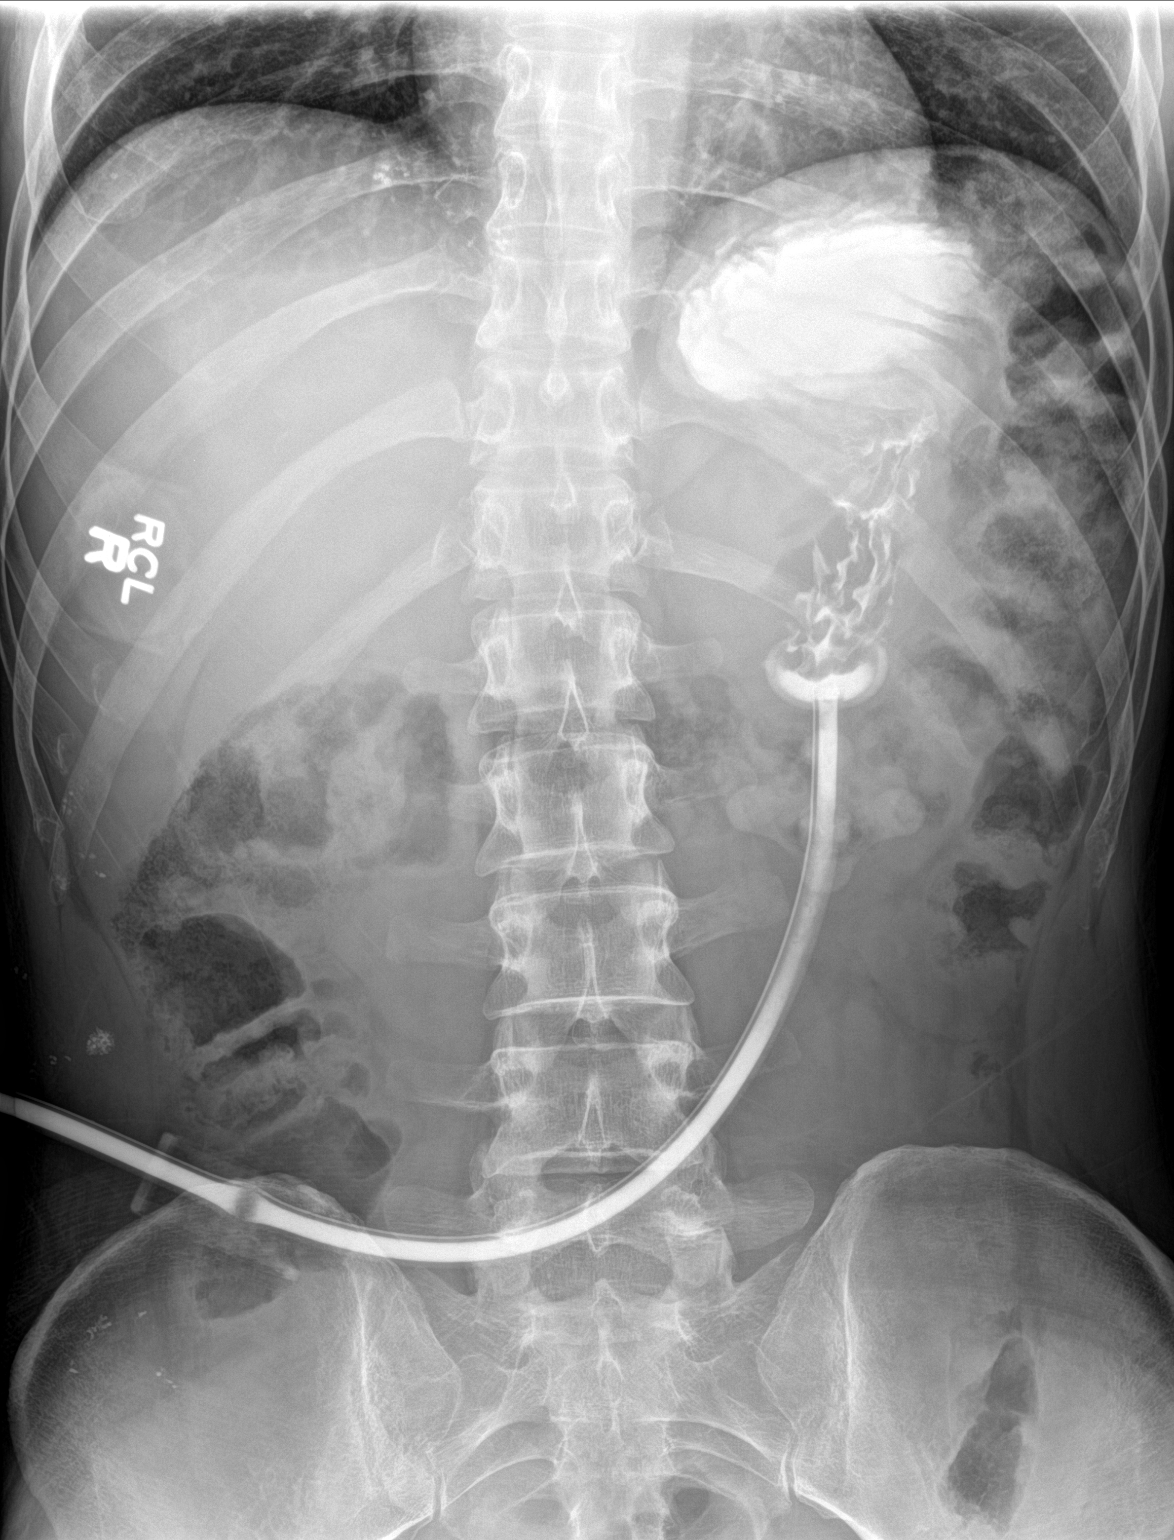

[1 of 1 positions shown; findings below may reference images not displayed]

FINDINGS: Contrast fills the gastrostomy tube and stomach. There is no
extravasated contrast. There are no disproportionally dilated loops
of bowel. The tip of the gastrostomy tube is in the body of the
stomach.
IMPRESSION: The gastrostomy tube is appropriately positioned within the body of
the stomach. There is no extravasated contrast to suggest leak.

## 2019-04-19 DIAGNOSIS — Z79899 Other long term (current) drug therapy: Secondary | ICD-10-CM | POA: Insufficient documentation

## 2019-06-22 ENCOUNTER — Emergency Department
Admission: EM | Admit: 2019-06-22 | Discharge: 2019-06-22 | Disposition: A | Discharge status: Home / Self Care | Payer: Medicare Other | Attending: Emergency Medicine | Admitting: Emergency Medicine

## 2019-06-22 ENCOUNTER — Emergency Department: Payer: Medicare Other

## 2019-06-22 ENCOUNTER — Encounter: Payer: Self-pay | Admitting: Emergency Medicine

## 2019-06-22 DIAGNOSIS — Z79899 Other long term (current) drug therapy: Secondary | ICD-10-CM | POA: Diagnosis not present

## 2019-06-22 DIAGNOSIS — E119 Type 2 diabetes mellitus without complications: Secondary | ICD-10-CM | POA: Insufficient documentation

## 2019-06-22 DIAGNOSIS — K9423 Gastrostomy malfunction: Secondary | ICD-10-CM

## 2019-06-22 DIAGNOSIS — Z7901 Long term (current) use of anticoagulants: Secondary | ICD-10-CM | POA: Insufficient documentation

## 2019-06-22 DIAGNOSIS — Z87891 Personal history of nicotine dependence: Secondary | ICD-10-CM | POA: Insufficient documentation

## 2019-06-22 DIAGNOSIS — K9429 Other complications of gastrostomy: Secondary | ICD-10-CM | POA: Insufficient documentation

## 2019-06-22 MED ORDER — IOHEXOL 300 MG/ML  SOLN
50.0000 mL | Freq: Once | INTRAMUSCULAR | Status: AC | PRN
  Administered 2019-06-22: 09:00:00 50 mL

## 2019-06-22 NOTE — ED Notes (Signed)
Pt's caregiver verbalized understanding of discharge instructions. NAD at this time. 

## 2019-06-22 NOTE — ED Notes (Signed)
Discharge signed by Pt's Aunt [facility]).

## 2019-06-22 NOTE — ED Provider Notes (Signed)
[facility] Emergency Department Provider Note   ____________________________________________    I have reviewed the triage vital signs and the nursing notes.   HISTORY  Chief Complaint Feeding Tube Dislodged   Patient nonverbal  HPI Randy Roach is a 43 y.o. male with history of ALS, diabetes who is bedbound cared for by his aunt who is with the patient today.  She reports she went to give him his medication today and his feeding tube slipped out.  She reports it is been in for over 2 years.  Had been working well.  Past Medical History:  Diagnosis Date  . ALS (amyotrophic lateral sclerosis) ([place])   . Diabetes mellitus without complication ([place])   . Respiratory failure, chronic [facility])     Patient Active Problem List   Diagnosis Date Noted  . Left lower lobe pneumonia ([place]) 12/08/2016  . Hyperglycemia 12/08/2016  . Anemia 12/08/2016  . Sepsis due to pneumonia ([place]) 05/08/2016    Past Surgical History:  Procedure Laterality Date  . APPENDECTOMY    . fractured hands    . PEG PLACEMENT N/A 04/19/2017   Procedure: PERCUTANEOUS ENDOSCOPIC GASTROSTOMY (PEG) REPLACEMENT;  Surgeon: Jonathon Bellows, MD;  Location: [facility] ENDOSCOPY;  Service: Endoscopy;  Laterality: N/A;  . PEG PLACEMENT N/A 04/19/2017   Procedure: PERCUTANEOUS ENDOSCOPIC GASTROSTOMY (PEG) PLACEMENT;  Surgeon: Jonathon Bellows, MD;  Location: [facility] ENDOSCOPY;  Service: Gastroenterology;  Laterality: N/A;  . PEG TUBE PLACEMENT Left   . TRACHEOSTOMY      Prior to Admission medications   Medication Sig Start Date End Date Taking? Authorizing Provider  acetaminophen (TYLENOL) 325 MG tablet Take 2 tablets (650 mg total) by mouth every 6 (six) hours as needed for mild pain (or Fever >/= 101). 05/11/16   Flora Lipps, MD  amitriptyline (ELAVIL) 25 MG tablet Take 25 mg by mouth at bedtime. Patient is taking a suspension. 12/24/15   [provider]  amoxicillin-clavulanate (AUGMENTIN) 875-125  MG tablet Place 1 tablet into feeding tube 2 (two) times daily. X 7 more days Patient not taking: Reported on 04/19/2017 12/12/16   Gladstone Lighter, MD  baclofen (LIORESAL) 10 MG tablet Place 10 mg into feeding tube every 8 (eight) hours as needed for muscle spasms.    [provider]  bisacodyl (DULCOLAX) 10 MG suppository Place 10 mg rectally as needed for moderate constipation.    [provider]  busPIRone (BUSPAR) 10 MG tablet Place 10 mg into feeding tube 3 (three) times daily.    [provider]  chlorhexidine gluconate, SAGE KIT, (PERIDEX) 0.12 % solution 15 mLs by Mouth Rinse route 2 (two) times daily. 05/11/16   Flora Lipps, MD  clonazePAM (KLONOPIN) 0.5 MG tablet Place 0.5 mg into feeding tube every 12 (twelve) hours as needed for anxiety.    [provider]  DULoxetine (CYMBALTA) 60 MG capsule Take 60 mg by mouth daily. In peg tube    [provider]  enoxaparin (LOVENOX) 40 MG/0.4ML injection Inject 40 mg into the skin daily.    [provider]  ipratropium-albuterol (DUONEB) 0.5-2.5 (3) MG/3ML SOLN Take 3 mLs by nebulization every 4 (four) hours as needed. Patient not taking: Reported on 12/08/2016 05/11/16   Flora Lipps, MD  Nutritional Supplements (FEEDING SUPPLEMENT, OSMOLITE 1.5 CAL,) LIQD Place 237 mLs into feeding tube every 4 (four) hours. 12/12/16   Gladstone Lighter, MD  Oxycodone HCl 10 MG TABS Place 10 mg into feeding tube every 4 (four) hours as  needed.    [provider]  Oxycodone HCl 20 MG TABS 20 mg by PEG Tube route every 6 (six) hours as needed.    [provider]  polyethylene glycol (MIRALAX / GLYCOLAX) packet Place 17 g into feeding tube daily.    [provider]  ranitidine (ZANTAC) 150 MG tablet Take 150 mg by mouth 2 (two) times daily.    [provider]  riluzole (RILUTEK) 50 MG tablet Place 50 mg into feeding tube 2 (two) times daily.     [provider]   scopolamine (TRANSDERM-SCOP) 1 MG/3DAYS Place 1 patch (1.5 mg total) onto the skin every 3 (three) days. 12/14/16   Gladstone Lighter, MD  Selenium Sulfide 2.25 % SHAM Apply 1 application topically 2 (two) times a week. Monday and Thursday    [provider]  Skin Protectants, Misc. (EUCERIN) cream Apply 1 application topically 2 (two) times daily.    [provider]  sodium phosphate (FLEET) 7-19 GM/118ML ENEM Place 133 mLs (1 enema total) rectally once as needed for severe constipation. 12/12/16   Gladstone Lighter, MD  tiZANidine (ZANAFLEX) 4 MG tablet Place 4 mg into feeding tube every 8 (eight) hours as needed for muscle spasms.    [provider]  Water For Irrigation, Sterile (FREE WATER) SOLN Place 200 mLs into feeding tube every 8 (eight) hours. 12/12/16   Gladstone Lighter, MD     Allergies Patient has no known allergies.  History reviewed. No pertinent family history.  Social History Social History   Tobacco Use  . Smoking status: Former Smoker    Packs/day: 1.00    Types: Cigarettes  . Smokeless tobacco: Never Used  Substance Use Topics  . Alcohol use: No    Comment: occ  . Drug use: No    Review of Systems limited as patient unable to provide history  Constitutional: No reports of fevers  Respiratory: No cough Gastrointestinal: No reports of abdominal distention    ____________________________________________   PHYSICAL EXAM:  VITAL SIGNS: ED Triage Vitals  Enc Vitals Group     BP 06/22/19 0900 (!) 135/95     Pulse --      Resp 06/22/19 0900 16     Temp --      Temp src --      SpO2 06/22/19 0853 99 %     Weight --      Height --      Head Circumference --      Peak Flow --      Pain Score --      Pain Loc --      Pain Edu? --      Excl. in [place]? --     Constitutional: Alert ENT: Patient with trach  Cardiovascular: Normal rate, regular rhythm.  Good peripheral circulation. Respiratory: Normal respiratory effort.   No retractions. Lungs CTAB. Gastrointestinal: G-tube site with some granulation tissue surrounding, otherwise CDI, aunt has G-tube 22 [place], balloon is intact  Musculoskeletal:   Warm and well perfused  Skin:  Skin is warm, dry and intact. No rash noted.   ____________________________________________   LABS (all labs ordered are listed, but only abnormal results are displayed)  Labs Reviewed - No data to display ____________________________________________  EKG  None ____________________________________________  RADIOLOGY  KUB ____________________________________________   PROCEDURES  Procedure(s) performed:yes  Gastrostomy tube replacement  Date/Time: 06/22/2019 9:33 AM Performed by: Lavonia Drafts, MD Authorized by: Lavonia Drafts, MD  Consent: Verbal consent obtained. Risks  and benefits: risks, benefits and alternatives were discussed Consent given by: patient and guardian Patient understanding: patient states understanding of the procedure being performed Patient consent: the patient's understanding of the procedure matches consent given Procedure consent: procedure consent matches procedure scheduled Required items: required blood products, implants, devices, and special equipment available Patient identity confirmed: arm band Preparation: Patient was prepped and draped in the usual sterile fashion. Local anesthesia used: no  Anesthesia: Local anesthesia used: no  Sedation: Patient sedated: no  Patient tolerance: patient tolerated the procedure well with no immediate complications      Critical Care performed: No ____________________________________________   INITIAL IMPRESSION / ASSESSMENT AND PLAN / ED COURSE  Pertinent labs & imaging results that were available during my care of the patient were reviewed by me and considered in my medical decision making (see chart for details).  Patient's G-tube dislodged, it was replaced without  difficulty, x-ray with contrast confirms placement.  Appropriate for discharge at this time    ____________________________________________   FINAL CLINICAL IMPRESSION(S) / ED DIAGNOSES  Final diagnoses:  Malfunction of percutaneous endoscopic gastrostomy (PEG) tube [facility])        Note:  This document was prepared using Dragon voice recognition software and may include unintentional dictation errors.   Lavonia Drafts, MD 06/22/19 [phone]

## 2019-06-22 NOTE — ED Notes (Signed)
MD Kinner at bedside to replace feeding tube.

## 2019-06-22 NOTE — ED Notes (Signed)
Called [facility] for transport  To [place]. [address]  [phone]

## 2019-06-22 NOTE — ED Triage Notes (Signed)
Pt to ED by EMS due to feeding tube dislodging. Per care giver has been in place for approx 2 years. Some bleeding noted at site but no s/s of infection. Pt has hx of ALS.

## 2019-06-24 ENCOUNTER — Encounter: Payer: Self-pay | Admitting: Emergency Medicine

## 2019-06-24 ENCOUNTER — Emergency Department: Payer: Medicare Other

## 2019-06-24 ENCOUNTER — Other Ambulatory Visit: Payer: Self-pay

## 2019-06-24 ENCOUNTER — Emergency Department
Admission: EM | Admit: 2019-06-24 | Discharge: 2019-06-24 | Disposition: A | Discharge status: Home / Self Care | Payer: Medicare Other | Attending: Emergency Medicine | Admitting: Emergency Medicine

## 2019-06-24 DIAGNOSIS — E119 Type 2 diabetes mellitus without complications: Secondary | ICD-10-CM | POA: Diagnosis not present

## 2019-06-24 DIAGNOSIS — Z79899 Other long term (current) drug therapy: Secondary | ICD-10-CM | POA: Diagnosis not present

## 2019-06-24 DIAGNOSIS — K9423 Gastrostomy malfunction: Secondary | ICD-10-CM | POA: Insufficient documentation

## 2019-06-24 DIAGNOSIS — Z87891 Personal history of nicotine dependence: Secondary | ICD-10-CM | POA: Diagnosis not present

## 2019-06-24 MED ORDER — DIATRIZOATE MEGLUMINE & SODIUM 66-10 % PO SOLN
30.0000 mL | Freq: Once | ORAL | Status: AC
  Administered 2019-06-24: 30 mL

## 2019-06-24 NOTE — ED Notes (Signed)
Dr Jari Pigg reinserted 22 Fr G tube at bedside, inflated with 69ml.

## 2019-06-24 NOTE — ED Provider Notes (Signed)
[facility] Emergency Department Provider Note  ____________________________________________   First MD Initiated Contact with Patient 06/24/19 1543     (approximate)  I have reviewed the triage vital signs and the nursing notes.   HISTORY  Chief Complaint G-tube issue  HPI Randy Roach is a 42 y.o. male with ALS, diabetes who is bedbound who comes in today with a nurse for his G-tube falling out.  Patient is G-tube was replaced 2 days ago after falling out.  Had been in there for over 2 years.  Was initially placed by GI.  Had not had any issues prior.  With the balloon came out today there was about 3 to 5 cc of normal saline in it.  He is not had any vomiting or any other symptoms.  Patient is nonverbal and trached at baseline.  Patient has a DNR.  Unable to get full HPI due to patient being nonverbal          Past Medical History:  Diagnosis Date  . ALS (amyotrophic lateral sclerosis) ([place])   . Diabetes mellitus without complication ([place])   . Respiratory failure, chronic [facility])     Patient Active Problem List   Diagnosis Date Noted  . Left lower lobe pneumonia ([place]) 12/08/2016  . Hyperglycemia 12/08/2016  . Anemia 12/08/2016  . Sepsis due to pneumonia ([place]) 05/08/2016    Past Surgical History:  Procedure Laterality Date  . APPENDECTOMY    . fractured hands    . PEG PLACEMENT N/A 04/19/2017   Procedure: PERCUTANEOUS ENDOSCOPIC GASTROSTOMY (PEG) REPLACEMENT;  Surgeon: Jonathon Bellows, MD;  Location: [facility] ENDOSCOPY;  Service: Endoscopy;  Laterality: N/A;  . PEG PLACEMENT N/A 04/19/2017   Procedure: PERCUTANEOUS ENDOSCOPIC GASTROSTOMY (PEG) PLACEMENT;  Surgeon: Jonathon Bellows, MD;  Location: [facility] ENDOSCOPY;  Service: Gastroenterology;  Laterality: N/A;  . PEG TUBE PLACEMENT Left   . TRACHEOSTOMY      Prior to Admission medications   Medication Sig Start Date End Date Taking? Authorizing Provider  acetaminophen (TYLENOL) 325 MG tablet Take 2  tablets (650 mg total) by mouth every 6 (six) hours as needed for mild pain (or Fever >/= 101). 05/11/16   Flora Lipps, MD  amitriptyline (ELAVIL) 25 MG tablet Take 25 mg by mouth at bedtime. Patient is taking a suspension. 12/24/15   [provider]  amoxicillin-clavulanate (AUGMENTIN) 875-125 MG tablet Place 1 tablet into feeding tube 2 (two) times daily. X 7 more days Patient not taking: Reported on 04/19/2017 12/12/16   Gladstone Lighter, MD  baclofen (LIORESAL) 10 MG tablet Place 10 mg into feeding tube every 8 (eight) hours as needed for muscle spasms.    [provider]  bisacodyl (DULCOLAX) 10 MG suppository Place 10 mg rectally as needed for moderate constipation.    [provider]  busPIRone (BUSPAR) 10 MG tablet Place 10 mg into feeding tube 3 (three) times daily.    [provider]  chlorhexidine gluconate, SAGE KIT, (PERIDEX) 0.12 % solution 15 mLs by Mouth Rinse route 2 (two) times daily. 05/11/16   Flora Lipps, MD  clonazePAM (KLONOPIN) 0.5 MG tablet Place 0.5 mg into feeding tube every 12 (twelve) hours as needed for anxiety.    [provider]  DULoxetine (CYMBALTA) 60 MG capsule Take 60 mg by mouth daily. In peg tube    [provider]  enoxaparin (LOVENOX) 40 MG/0.4ML injection Inject 40 mg into the skin daily.    [provider]  ipratropium-albuterol (DUONEB) 0.5-2.5 (  3) MG/3ML SOLN Take 3 mLs by nebulization every 4 (four) hours as needed. Patient not taking: Reported on 12/08/2016 05/11/16   Flora Lipps, MD  Nutritional Supplements (FEEDING SUPPLEMENT, OSMOLITE 1.5 CAL,) LIQD Place 237 mLs into feeding tube every 4 (four) hours. 12/12/16   Gladstone Lighter, MD  Oxycodone HCl 10 MG TABS Place 10 mg into feeding tube every 4 (four) hours as needed.    [provider]  Oxycodone HCl 20 MG TABS 20 mg by PEG Tube route every 6 (six) hours as needed.    [provider]  polyethylene glycol (MIRALAX /  GLYCOLAX) packet Place 17 g into feeding tube daily.    [provider]  ranitidine (ZANTAC) 150 MG tablet Take 150 mg by mouth 2 (two) times daily.    [provider]  riluzole (RILUTEK) 50 MG tablet Place 50 mg into feeding tube 2 (two) times daily.     [provider]  scopolamine (TRANSDERM-SCOP) 1 MG/3DAYS Place 1 patch (1.5 mg total) onto the skin every 3 (three) days. 12/14/16   Gladstone Lighter, MD  Selenium Sulfide 2.25 % SHAM Apply 1 application topically 2 (two) times a week. Monday and Thursday    [provider]  Skin Protectants, Misc. (EUCERIN) cream Apply 1 application topically 2 (two) times daily.    [provider]  sodium phosphate (FLEET) 7-19 GM/118ML ENEM Place 133 mLs (1 enema total) rectally once as needed for severe constipation. 12/12/16   Gladstone Lighter, MD  tiZANidine (ZANAFLEX) 4 MG tablet Place 4 mg into feeding tube every 8 (eight) hours as needed for muscle spasms.    [provider]  Water For Irrigation, Sterile (FREE WATER) SOLN Place 200 mLs into feeding tube every 8 (eight) hours. 12/12/16   Gladstone Lighter, MD    Allergies Patient has no known allergies.  No family history on file.  Social History Social History   Tobacco Use  . Smoking status: Former Smoker    Packs/day: 1.00    Types: Cigarettes  . Smokeless tobacco: Never Used  Substance Use Topics  . Alcohol use: No    Comment: occ  . Drug use: No      Review of Systems Unable to get full review of system due to baseline nonverbal ____________________________________________   PHYSICAL EXAM:  VITAL SIGNS: Blood pressure (!) 127/91, pulse (!) 122, temperature 99.1 F (37.3 C), temperature source Oral, resp. rate 12, SpO2 98 %.  Constitutional: Patient is in no acute distress, trached Eyes: Conjunctivae are normal. EOMI. Head: Atraumatic. Nose: No congestion/rhinnorhea. Mouth/Throat: Mucous membranes are moist.   Neck:  No stridor. Trachea Midline. FROM Cardiovascular: Cardiac, regular rhythm. Grossly normal heart sounds.  Good peripheral circulation. Respiratory: Trach in place, coarse breath sounds Gastrointestinal: Soft and nontender.  G-tube out.  Stoma with some mild bleeding around it baseline per nurse Musculoskeletal: No lower extremity tenderness nor edema.  No joint effusions. Neurologic: Nonverbal not able to move Skin:  Skin is warm, dry and intact. No rash noted. Psychiatric: Unable to fully assess GU: Deferred   _ RADIOLOGY I, Vanessa Mora, personally viewed and evaluated these images (plain radiographs) as part of my medical decision making, as well as reviewing the written report by the radiologist.  ED MD interpretation:  Contrast in stomach.   Official radiology report(s): Dg Abdomen 1 View  Result Date: 06/24/2019 CLINICAL DATA:  Contrast for the G-tube check EXAM: ABDOMEN - 1 VIEW COMPARISON:  June 22, 2019 FINDINGS:  There is contrast injection seen through the gastrotomy tube with contrast seen within the gastric fundus, distal portion of the stomach as as the first portion the duodenum. IMPRESSION: Contrast injection with contrast in the stomach. Electronically Signed   By: Prudencio Pair M.D.   On: 06/24/2019 17:08    ____________________________________________   PROCEDURES  Procedure(s) performed (including Critical Care):  Gastrostomy tube replacement  Date/Time: 06/24/2019 5:18 PM Performed by: Vanessa [place], MD Authorized by: Vanessa Camino, MD  Consent: Verbal consent obtained. Written consent not obtained. Risks and benefits: risks, benefits and alternatives were discussed Time out: Immediately prior to procedure a "time out" was called to verify the correct patient, procedure, equipment, support staff and site/side marked as required. Preparation: Patient was prepped and draped in the usual sterile fashion. Local anesthesia used: no  Anesthesia: Local  anesthesia used: no  Sedation: Patient sedated: no  Patient tolerance: patient tolerated the procedure well with no immediate complications    22 french G tube with 20 ML NS.   ____________________________________________   INITIAL IMPRESSION / ASSESSMENT AND PLAN / ED COURSE  Randy Roach was evaluated in Emergency Department on 06/24/2019 for the symptoms described in the history of present illness. He was evaluated in the context of the global COVID-19 pandemic, which necessitated consideration that the patient might be at risk for infection with the SARS-CoV-2 virus that causes COVID-19. Institutional protocols and algorithms that pertain to the evaluation of patients at risk for COVID-19 are in a state of rapid change based on information released by regulatory bodies including the CDC and federal and state organizations. These policies and algorithms were followed during the patient's care in the ED.    Patient presents with G-tube dislodgment.  After the G-tube came out a little there is 3- 5 cc of fluid in it.  Is possible that this is secondary to the balloon having an issue and deflating vs not filled correctly with 20 cc. Place a new G-tube and inflated to 20 cc.  Will do a tube check to ensure the place.  No vomiting or severe pain to suggest perforation.  No evidence of cellulitis.   I discussed the provisional nature of ED diagnosis, the treatment so far, the ongoing plan of care, follow up appointments and return precautions with the patient and any family or support people present. They expressed understanding and agreed with the plan, discharged home.           ____________________________________________   FINAL CLINICAL IMPRESSION(S) / ED DIAGNOSES   Final diagnoses:  Malfunction of gastrostomy tube ([place])      MEDICATIONS GIVEN DURING THIS VISIT:  Medications  diatrizoate meglumine-sodium (GASTROGRAFIN) 66-10 % solution 30 mL (30 mLs Per Tube Given  06/24/19 1636)     ED Discharge Orders    None       Note:  This document was prepared using Dragon voice recognition software and may include unintentional dictation errors.   Vanessa [place], MD 06/24/19 [phone]

## 2019-06-24 NOTE — ED Triage Notes (Signed)
Pt arrived via [facility], they were called after Pt's G tube fell out. VS were not collected by EMS per family's request.

## 2019-06-24 NOTE — Discharge Instructions (Addendum)
He replaced his G-tube today.  Return to ER with any other concerns.

## 2019-07-23 ENCOUNTER — Encounter (INDEPENDENT_AMBULATORY_CARE_PROVIDER_SITE_OTHER): Payer: Self-pay

## 2019-07-23 ENCOUNTER — Other Ambulatory Visit: Payer: Self-pay

## 2019-07-23 ENCOUNTER — Ambulatory Visit (INDEPENDENT_AMBULATORY_CARE_PROVIDER_SITE_OTHER): Payer: Medicare Other | Admitting: Gastroenterology

## 2019-07-23 ENCOUNTER — Ambulatory Visit: Payer: Self-pay | Admitting: Surgery

## 2019-07-23 DIAGNOSIS — K9423 Gastrostomy malfunction: Secondary | ICD-10-CM | POA: Diagnosis not present

## 2019-07-23 NOTE — Progress Notes (Signed)
Jonathon Bellows MD, MRCP(U.K) [address]  [place]  [place], [place] [address]  Main: [phone]  Fax: [phone]   Gastroenterology Consultation  Referring Provider:     Montel Clock, MD Primary Care Physician:  Montel Clock, MD Primary Gastroenterologist:  Dr. Jonathon Bellows  Reason for Consultation:     PEG tube malfunction        HPI:   Randy Roach is a 43 y.o. y/o male referred for consultation & management  by Dr. Carolynn Comment, Mariel Kansky, MD.    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  ED visit on 06/30/2021 for G-tube coming out.  He is here today to see me for that.  He is here today with his aunt and his aide who have come in with the patient.  They said that the tube is not coming out at this point but has been a lot of drainage and purulent material coming out from the location.   Past Medical History:  Diagnosis Date  . ALS (amyotrophic lateral sclerosis) ([place])   . Diabetes mellitus without complication ([place])   . Respiratory failure, chronic (HCC)     Past Surgical History:  Procedure Laterality Date  . APPENDECTOMY    . fractured hands    . PEG PLACEMENT N/A 04/19/2017   Procedure: PERCUTANEOUS ENDOSCOPIC GASTROSTOMY (PEG) REPLACEMENT;  Surgeon: Jonathon Bellows, MD;  Location: [facility] ENDOSCOPY;  Service: Endoscopy;  Laterality: N/A;  . PEG PLACEMENT N/A 04/19/2017   Procedure: PERCUTANEOUS ENDOSCOPIC GASTROSTOMY (PEG) PLACEMENT;  Surgeon: Jonathon Bellows, MD;  Location: [facility] ENDOSCOPY;  Service: Gastroenterology;  Laterality: N/A;  . PEG TUBE PLACEMENT Left   . TRACHEOSTOMY      Prior to Admission medications   Medication Sig Start Date End Date Taking? Authorizing Provider  acetaminophen (TYLENOL) 325 MG tablet Take 2 tablets (650 mg total) by mouth every 6 (six) hours as needed for mild pain (or Fever >/= 101). 05/11/16   Flora Lipps, MD  amitriptyline (ELAVIL) 25 MG tablet Take 25 mg by mouth at bedtime. Patient is taking a  suspension. 12/24/15   [provider]  amoxicillin-clavulanate (AUGMENTIN) 875-125 MG tablet Place 1 tablet into feeding tube 2 (two) times daily. X 7 more days Patient not taking: Reported on 04/19/2017 12/12/16   Gladstone Lighter, MD  baclofen (LIORESAL) 10 MG tablet Place 10 mg into feeding tube every 8 (eight) hours as needed for muscle spasms.    [provider]  bisacodyl (DULCOLAX) 10 MG suppository Place 10 mg rectally as needed for moderate constipation.    [provider]  busPIRone (BUSPAR) 10 MG tablet Place 10 mg into feeding tube 3 (three) times daily.    [provider]  chlorhexidine gluconate, SAGE KIT, (PERIDEX) 0.12 % solution 15 mLs by Mouth Rinse route 2 (two) times daily. 05/11/16   Flora Lipps, MD  clonazePAM (KLONOPIN) 0.5 MG tablet Place 0.5 mg into feeding tube every 12 (twelve) hours as needed for anxiety.    [provider]  DULoxetine (CYMBALTA) 60 MG capsule Take 60 mg by mouth daily. In peg tube    [provider]  enoxaparin (LOVENOX) 40 MG/0.4ML injection Inject 40 mg into the skin daily.    [provider]  ipratropium-albuterol (DUONEB) 0.5-2.5 (3) MG/3ML SOLN Take 3 mLs by nebulization every 4 (four) hours as needed. Patient not taking: Reported on 12/08/2016 05/11/16   Flora Lipps, MD  Nutritional Supplements (FEEDING SUPPLEMENT, OSMOLITE 1.5  CAL,) LIQD Place 237 mLs into feeding tube every 4 (four) hours. 12/12/16   Gladstone Lighter, MD  Oxycodone HCl 10 MG TABS Place 10 mg into feeding tube every 4 (four) hours as needed.    [provider]  Oxycodone HCl 20 MG TABS 20 mg by PEG Tube route every 6 (six) hours as needed.    [provider]  polyethylene glycol (MIRALAX / GLYCOLAX) packet Place 17 g into feeding tube daily.    [provider]  ranitidine (ZANTAC) 150 MG tablet Take 150 mg by mouth 2 (two) times daily.    [provider]  riluzole (RILUTEK) 50 MG  tablet Place 50 mg into feeding tube 2 (two) times daily.     [provider]  scopolamine (TRANSDERM-SCOP) 1 MG/3DAYS Place 1 patch (1.5 mg total) onto the skin every 3 (three) days. 12/14/16   Gladstone Lighter, MD  Selenium Sulfide 2.25 % SHAM Apply 1 application topically 2 (two) times a week. Monday and Thursday    [provider]  Skin Protectants, Misc. (EUCERIN) cream Apply 1 application topically 2 (two) times daily.    [provider]  sodium phosphate (FLEET) 7-19 GM/118ML ENEM Place 133 mLs (1 enema total) rectally once as needed for severe constipation. 12/12/16   Gladstone Lighter, MD  tiZANidine (ZANAFLEX) 4 MG tablet Place 4 mg into feeding tube every 8 (eight) hours as needed for muscle spasms.    [provider]  Water For Irrigation, Sterile (FREE WATER) SOLN Place 200 mLs into feeding tube every 8 (eight) hours. 12/12/16   Gladstone Lighter, MD    No family history on file.   Social History   Tobacco Use  . Smoking status: Former Smoker    Packs/day: 1.00    Types: Cigarettes  . Smokeless tobacco: Never Used  Substance Use Topics  . Alcohol use: No    Comment: occ  . Drug use: No    Allergies as of 07/23/2019 - Review Complete 06/24/2019  Allergen Reaction Noted  . Debrox [carbamide peroxide]  06/24/2019  . Latex  06/24/2019    Review of Systems:    All systems reviewed and negative except where noted in HPI.   Physical Exam:  There were no vitals taken for this visit. No LMP for male patient. Psych: Lying comfortable on the stretcher communicates by blinking his eyes General:   Alert,   Head:  Normocephalic and atraumatic. Eyes:  Sclera clear, no icterus.   Conjunctiva pink. Ears: Grossly normal Nose:  No deformity, discharge, or lesions. Mouth: Did not assess Neck:  Supple; no masses or thyromegaly. Abdomen:  Normal bowel sounds.  No bruits.  Soft, non-tender and non-distended without masses, hepatosplenomegaly or  hernias noted.  No guarding or rebound tenderness.   PEG tube site shows granulation tissue versus mildly inflamed appearance.  The tube is clearly rotatable.  When I apply pressure on the edges of the gastrostomy site I noticed purulent material coming out from a point.  No tenderness no erythema. Neurologic:  Alert . Skin:  Intact without significant lesions or rashes. No jaundice. Lymph Nodes:  No significant cervical adenopathy. Psych: Cannot assess  Imaging Studies: Dg Abdomen 1 View  Result Date: 06/24/2019 CLINICAL DATA:  Contrast for the G-tube check EXAM: ABDOMEN - 1 VIEW COMPARISON:  June 22, 2019 FINDINGS: There is contrast injection seen through the gastrotomy tube with contrast seen within the gastric fundus, distal portion of the stomach as as the first portion  the duodenum. IMPRESSION: Contrast injection with contrast in the stomach. Electronically Signed   By: Prudencio Pair M.D.   On: 06/24/2019 17:08    Assessment and Plan:   DEROY NOAH is a 43 y.o. y/o male has been referred for valuation of malfunctioning G-tube.  To ER visit for G-tube that keep falling out.  Last visit on 06/24/2019 when the balloon came out it only had 3 to 5 cc of normal saline in it.  Likely secondary to the balloon not being adequately filled with saline.  Referred to me for evaluation.  At this point of time the G-tube appears secure.  It is freely rotatable.  I did note purulent material coming out from the 5 o'clock position on application of pressure.  It does not appear to be mucus but rather purulent.  There was no clear appearance of an abscess.  There is possibly some granulation tissue or an inflammation around the site.  I suggested that we have the patient seen by surgery to see if there is anything they can incise and drain from a possible tiny abscess.  We will have the staff call the surgical office to see if anyone can see the patient today contributing to the fact that it is hard for  him to be coming back at a later point.  I do not believe that the G-tube needs to be changed at this point of time.   Follow up as needed  Dr Jonathon Bellows MD,MRCP(U.K)

## 2019-07-24 ENCOUNTER — Ambulatory Visit (INDEPENDENT_AMBULATORY_CARE_PROVIDER_SITE_OTHER): Payer: Medicare Other | Admitting: General Surgery

## 2019-07-24 ENCOUNTER — Encounter: Payer: Self-pay | Admitting: General Surgery

## 2019-07-24 ENCOUNTER — Other Ambulatory Visit: Payer: Self-pay

## 2019-07-24 VITALS — BP 132/87 | HR 103 | Temp 97.3°F | Wt 174.8 lb

## 2019-07-24 DIAGNOSIS — L918 Other hypertrophic disorders of the skin: Secondary | ICD-10-CM | POA: Diagnosis not present

## 2019-07-24 NOTE — Progress Notes (Signed)
Patient ID: Randy Roach, male   DOB: [birth year], 43 y.o.   MRN: [id number]  Chief Complaint  Patient presents with  . New Patient (Initial Visit)    abscess    HPI Randy Roach is a 43 y.o. male.   He has amyotrophic lateral sclerosis and is G-tube dependent.  His G-tube was placed in 2018.  He has recently had two emergency department visits for his G-tube falling out.  The tube was replaced without difficulty in the emergency department.  He was seen yesterday in gastroenterology by Dr. Vicente Males.  There was some purulent material expressed from the tract and Dr. Vicente Males sent him to general surgery to evaluate whether or not an incision and drainage would be indicated.  History is limited secondary to the patient's medical condition.   Past Medical History:  Diagnosis Date  . ALS (amyotrophic lateral sclerosis) ([place])   . Diabetes mellitus without complication ([place])   . Respiratory failure, chronic (HCC)     Past Surgical History:  Procedure Laterality Date  . APPENDECTOMY    . fractured hands    . PEG PLACEMENT N/A 04/19/2017   Procedure: PERCUTANEOUS ENDOSCOPIC GASTROSTOMY (PEG) REPLACEMENT;  Surgeon: Jonathon Bellows, MD;  Location: [facility] ENDOSCOPY;  Service: Endoscopy;  Laterality: N/A;  . PEG PLACEMENT N/A 04/19/2017   Procedure: PERCUTANEOUS ENDOSCOPIC GASTROSTOMY (PEG) PLACEMENT;  Surgeon: Jonathon Bellows, MD;  Location: [facility] ENDOSCOPY;  Service: Gastroenterology;  Laterality: N/A;  . PEG TUBE PLACEMENT Left   . TRACHEOSTOMY      Family History  Problem Relation Age of Onset  . Heart disease Mother   . ALS Father     Social History Social History   Tobacco Use  . Smoking status: Former Smoker    Packs/day: 1.00    Types: Cigarettes  . Smokeless tobacco: Never Used  Substance Use Topics  . Alcohol use: No    Comment: occ  . Drug use: No    Allergies  Allergen Reactions  . Debrox [Carbamide Peroxide]   . Latex     Current Outpatient Medications  Medication Sig  Dispense Refill  . acetaminophen (TYLENOL) 325 MG tablet Take 2 tablets (650 mg total) by mouth every 6 (six) hours as needed for mild pain (or Fever >/= 101). 1 tablet 0  . baclofen (LIORESAL) 10 MG tablet Place 10 mg into feeding tube every 8 (eight) hours as needed for muscle spasms.    . bisacodyl (DULCOLAX) 10 MG suppository Place 10 mg rectally as needed for moderate constipation.    . busPIRone (BUSPAR) 10 MG tablet Place 10 mg into feeding tube 3 (three) times daily.    . chlorhexidine gluconate, SAGE KIT, (PERIDEX) 0.12 % solution 15 mLs by Mouth Rinse route 2 (two) times daily. 120 mL 0  . diazepam (VALIUM) 5 MG tablet Take 5 mg by mouth every 12 (twelve) hours as needed for anxiety.    . famotidine (PEPCID) 20 MG tablet Take 20 mg by mouth 2 (two) times daily.    . insulin NPH Human (NOVOLIN N) 100 UNIT/ML injection Inject 20 Units into the skin 2 (two) times daily.    . Nutritional Supplements (FEEDING SUPPLEMENT, OSMOLITE 1.5 CAL,) LIQD Place 237 mLs into feeding tube every 4 (four) hours. 10000 mL 0  . Oxycodone HCl 10 MG TABS Place 10 mg into feeding tube every 4 (four) hours as needed.    . Oxycodone HCl 20 MG TABS 25 mg by PEG Tube route every 6 (six)  hours as needed.     . polyethylene glycol (MIRALAX / GLYCOLAX) packet Place 17 g into feeding tube daily.    Marland Kitchen scopolamine (TRANSDERM-SCOP) 1 MG/3DAYS Place 1 patch (1.5 mg total) onto the skin every 3 (three) days. 10 patch 12  . Skin Protectants, Misc. (EUCERIN) cream Apply 1 application topically 2 (two) times daily.    . sodium phosphate (FLEET) 7-19 GM/118ML ENEM Place 133 mLs (1 enema total) rectally once as needed for severe constipation. 1000 mL 0  . tamsulosin (FLOMAX) 0.4 MG CAPS capsule Take 0.4 mg by mouth as needed.    Marland Kitchen tiZANidine (ZANAFLEX) 4 MG tablet Place 4 mg into feeding tube every 8 (eight) hours as needed for muscle spasms.    Marland Kitchen venlafaxine (EFFEXOR) 75 MG tablet Take 75 mg by mouth every morning.    .  venlafaxine XR (EFFEXOR-XR) 150 MG 24 hr capsule Take 150 mg by mouth every evening.    . Water For Irrigation, Sterile (FREE WATER) SOLN Place 200 mLs into feeding tube every 8 (eight) hours. 1000 mL 0  . zolpidem (AMBIEN) 5 MG tablet Take 5 mg by mouth at bedtime as needed for sleep.     No current facility-administered medications for this visit.     Review of Systems Review of Systems  Unable to perform ROS: Other    Blood pressure 132/87, pulse (!) 103, temperature (!) 97.3 F (36.3 C), weight 174 lb 12.8 oz (79.3 kg). Body mass index is 23.71 kg/m.   Physical Exam Physical Exam Constitutional:      Appearance: He is ill-appearing. He is not toxic-appearing.     Comments: He communicates his responses to questions by eye movement.  HENT:     Head: Normocephalic and atraumatic.     Nose: Nose normal.  Eyes:     General: No scleral icterus.       Right eye: No discharge.        Left eye: No discharge.  Neck:     Comments: Tracheostomy and cervical collar in place. Cardiovascular:     Rate and Rhythm: Tachycardia present.  Pulmonary:     Effort: No respiratory distress.     Comments: Chronically ventilator dependent. Abdominal:     Palpations: Abdomen is soft.       Comments: Gastrostomy tube in the left upper quadrant.  The flange is not flush with the skin.  There is hypertrophic granulation tissue surrounding the tract.  With pressure, there is some mucopurulent drainage.  I do not appreciate any fluctuance or areas that suggest a focal abscess that could be drained.  Genitourinary:    Comments: Deferred Musculoskeletal:     Comments: He is essentially immobile due to his disease.  Skin:    General: Skin is warm.     Coloration: Skin is not jaundiced.  Neurological:     Mental Status: He is alert. Mental status is at baseline.  Psychiatric:        Behavior: Behavior normal.     Data Reviewed I reviewed the 2 emergency department visits of September 20 and  June 24, 2019.  These describe the replacement of the gastrostomy tube without difficulty.  On the second visit, there was very minimal water within the balloon.  A full 20 cc was placed in the balloon at the time of that visit and they have not had any further issues with G-tube displacement.  I also reviewed Dr. Georgeann Oppenheim note of 07/23/2019.  His description of  the site is similar to mine.  Assessment and plan This is a 43 year old male with ALS who is both trach/ventilator dependent and G-tube dependent.  I did not identify an obvious abscess to incise and drain.  The hypertrophic granulation tissue was treated with silver nitrate.  I also treated the inside of the tract.  After replacing the drain sponges, I cinched the flange down to ensure that the tube stays tightly affixed to the abdominal wall.  I will see him next week for reevaluation and likely a second treatment.  If he is responding well to the silver nitrate, I will write a prescription so that his home health aide is able to continue to treat the area, rather than require him to return to clinic each week, as transportation is understandably quite challenging.     Randy Roach 07/24/2019, 11:59 AM

## 2019-07-24 NOTE — Patient Instructions (Addendum)
Patient will follow up with Dr.Cannon in one week on 07/31/19 at 11:30 am.

## 2019-07-31 ENCOUNTER — Encounter: Payer: Self-pay | Admitting: General Surgery

## 2019-07-31 ENCOUNTER — Other Ambulatory Visit: Payer: Self-pay

## 2019-07-31 ENCOUNTER — Ambulatory Visit (INDEPENDENT_AMBULATORY_CARE_PROVIDER_SITE_OTHER): Payer: Medicare Other | Admitting: General Surgery

## 2019-07-31 VITALS — BP 126/82 | HR 92 | Temp 97.9°F | Wt 174.0 lb

## 2019-07-31 DIAGNOSIS — L918 Other hypertrophic disorders of the skin: Secondary | ICD-10-CM | POA: Diagnosis not present

## 2019-07-31 MED ORDER — SILVER NITRATE-POT NITRATE 75-25 % EX MISC: CUTANEOUS | 99 refills | Status: AC

## 2019-07-31 NOTE — Progress Notes (Signed)
Randy Roach is here today for reevaluation of granulation tissue around his PEG tube.  He is a 43 year old man with amyotrophic lateral sclerosis and is both tracheostomy and PEG dependent.  He had been having some difficulty with his tube, including 2 episodes of having a fall out.  There is hypertrophic granulation tissue around the site and he was referred to general surgery for evaluation of this as well as some purulent drainage.  I saw him last week and treated the site with silver nitrate sticks.  I did not appreciate an abscess that could be drained.  He is here today for reevaluation.  His nurse accompanies him today, along with his mother.  The nurse reports that the granulation tissue does seem to be slightly improved.  He does have some skin maceration at the tube site just outside the rim of granulation tissue.  She has also noticed some further purulent drainage.  Today's Vitals   07/31/19 1146  BP: 126/82  Pulse: 92  Temp: 97.9 F (36.6 C)  SpO2: 99%  Weight: 174 lb (78.9 kg)   Body mass index is 23.6 kg/m. Focused examination of the PEG tube site reveals that the granulation tissue is still present, although it seems less robust than a week ago.  There is some erythema of the skin surrounding the granulation tissue.  No abscess or drainable fluid collection is identified, although there is a small amount of fibrinous exudate within the tract.  I applied silver nitrate sticks to the granulation tissue again today.  The nurse reports that she has been applying zinc barrier cream to the skin surrounding the G-tube, to minimize skin maceration.  Today, I prescribed silver nitrate sticks so that the nurse may perform these treatments at home, rather than having to transport Randy Roach to our clinic.  I agree that barrier cream would be an appropriate treatment, but should he develop a more candidiasis-appearing rash, she should try nystatin powder, which she has available.  We will  see Randy Roach on an as-needed basis.

## 2019-07-31 NOTE — Patient Instructions (Addendum)
Continue Silver Nitrate treatments until the area has healed. May clean the area with distilled water prior to treatment. May use Chlorhexidine also to clean as needed.    Follow-up with our office as needed.  Please call and ask to speak with a nurse if you develop questions or concerns.

## 2019-09-11 DIAGNOSIS — Z931 Gastrostomy status: Secondary | ICD-10-CM | POA: Insufficient documentation

## 2019-10-07 ENCOUNTER — Other Ambulatory Visit: Payer: Self-pay

## 2019-10-07 ENCOUNTER — Telehealth: Payer: Self-pay | Admitting: Gastroenterology

## 2019-10-07 DIAGNOSIS — K9423 Gastrostomy malfunction: Secondary | ICD-10-CM

## 2019-10-07 NOTE — Telephone Encounter (Signed)
Butch Penny caregiver of pt would like a call regarding pt cb [phone]

## 2019-10-07 NOTE — Telephone Encounter (Signed)
Spoke with pt's home health nurse, Shawna Orleans, she states pt's G-tube came out, she was able to place the tube back in but states the balloon has "burst". Pt nurse is requesting a replacement balloon. I informed her that Dr. Tobi Bastos recommends scheduling pt to visit the [facility] Endo unit tomorrow to have the balloon replaced. She and pt's guardian agree and pt has been scheduled.

## 2019-10-08 ENCOUNTER — Ambulatory Visit: Admission: RE | Admit: 2019-10-08 | Payer: Medicare Other | Source: Home / Self Care | Admitting: Gastroenterology

## 2019-10-08 SURGERY — REPLACEMENT, PEG TUBE, WITHOUT ENDOSCOPY

## 2019-10-08 NOTE — H&P (Signed)
Patient is here today with caregiver for bedside change of G-tube which fell off yesterday.  He had a 22 French tube in place inflatable. G-tube site was shows an opening which is probably larger than a 22 French diameter.  We only had a 20 [place] in stock which was put in place and inflated with 6 cc of water.  It was not coming out but there was leakage seen from the sides of the tube.  Probably requires a larger diameter G-tube.  It will be ordered so that it can be changed over the next few days.  I will also get in touch with Dr. Lady Gary to see if the suture is possible at the G-tube site to help maintain patency of the lumen.  Dr Wyline Mood MD,MRCP [facility]) Gastroenterology/Hepatology Pager: [phone]

## 2019-10-09 ENCOUNTER — Telehealth: Payer: Self-pay

## 2019-10-09 NOTE — Telephone Encounter (Signed)
Spoke with pt's home health nurse and informed her that we were able to schedule pt's appointment with [facility] to follow up with Dr. Lady Gary. I informed her of appointment information.

## 2019-10-09 NOTE — Telephone Encounter (Signed)
-----   Message from Duanne Guess, MD sent at 10/08/2019  2:10 PM EST ----- Regarding: RE: G tube mutual patient Sure. I can see him and maybe pursestring the site in clinic. ----- Message ----- From: Wyline Mood, MD Sent: 10/08/2019   2:06 PM EST To: Duanne Guess, MD, Isa Rankin, CMA Subject: G tube mutual patient                          Good afternoon Dr. Gayla Medicus patient of ours.  Has a G-tube in place.  Came in for a G-tube change today.  It appears the opening on his abdomen has a very wide diameter and despite having a 22 [place] tube appears to have seepage from all around.  Is it possible for you to place a suture or help make the opening tighter to reduce leakage and subsequent dermatitis.  I explained to the caregiver that we will get back to them once I hear back from you.  RegardsKiran  Dr Wyline Mood MD,MRCP [facility]) Gastroenterology/Hepatology Pager: [phone]   C/c : Denton Meek please follow up

## 2019-10-13 ENCOUNTER — Telehealth: Payer: Self-pay | Admitting: General Surgery

## 2019-10-13 NOTE — Telephone Encounter (Signed)
Patient has some questions about an appt for tomorrow. Please call patient and advise.

## 2019-10-13 NOTE — Telephone Encounter (Signed)
Called patient in regards to questions about his appointment tomorrow; spoke to June (caregiver). States pt is coming to the office to see if Dr. Lady Gary needs to place sutures around g-tube to hold it in place. Is asking if it's okay for patient to be given meds in the morning.  Advised June that it is okay to continue medications as she normally does. Verbalized understanding.

## 2019-10-14 ENCOUNTER — Telehealth: Payer: Self-pay

## 2019-10-14 ENCOUNTER — Inpatient Hospital Stay: Admission: AD | Admit: 2019-10-14 | Payer: Medicare Other | Source: Ambulatory Visit | Admitting: Internal Medicine

## 2019-10-14 ENCOUNTER — Other Ambulatory Visit: Payer: Self-pay

## 2019-10-14 ENCOUNTER — Encounter: Payer: Self-pay | Admitting: General Surgery

## 2019-10-14 ENCOUNTER — Ambulatory Visit (INDEPENDENT_AMBULATORY_CARE_PROVIDER_SITE_OTHER): Payer: Medicare Other | Admitting: General Surgery

## 2019-10-14 VITALS — BP 122/85 | HR 96 | Temp 97.9°F | Resp 18 | Ht 66.0 in | Wt 174.8 lb

## 2019-10-14 DIAGNOSIS — E119 Type 2 diabetes mellitus without complications: Secondary | ICD-10-CM | POA: Insufficient documentation

## 2019-10-14 DIAGNOSIS — K9420 Gastrostomy complication, unspecified: Secondary | ICD-10-CM

## 2019-10-14 NOTE — Telephone Encounter (Signed)
Okay can you let Randy Roach know that we are waiting for the larger G-tube.  Also can you let the hospital coordinator know that the patient has refused admission i and to cancel it.  C/c Dr Antonietta Breach

## 2019-10-14 NOTE — Patient Instructions (Signed)
Continue to use the G-tube and await further instructions from Dr.Anna.

## 2019-10-14 NOTE — Telephone Encounter (Signed)
Pt's hospital admission has been cancelled.

## 2019-10-14 NOTE — Telephone Encounter (Signed)
Pt's caregiver June, called to inform Dr. Tobi Bastos that pt has decided he does not want to be admitted to the hospital. Pt caregiver states pt wants to keep his current G-tube in place while waiting for the larger G-tube to arrive at [facility] End[facility], although they are aware the time frame for the arrival of the G-tube is uncertain. I explained that I will inform Dr. Tobi Bastos of pt's decision.

## 2019-10-14 NOTE — Progress Notes (Signed)
Patient ID: Randy Roach, male   DOB: [birth year], 44 y.o.   MRN: [id number]  Chief Complaint  Patient presents with  . Follow-up    gtube suture    HPI Randy Roach is a 44 y.o. male.  I initially saw him in October 2020 for evaluation of some G-tube complications.  He had hypertrophic granulation tissue surrounding the tube, which was causing oozing.  This was treated with silver nitrate sticks and seems to have resolved.  More recently, however, there have been issues with leakage around the G-tube.  I was contacted by Dr. Vicente Males to see if I could place a pursestring suture around the tube to minimize this.  Today, in speaking with his mother and his home health nurse, it sounds like the tube was recently replaced with another 17 French tube but with a 10 mL balloon, rather than a 20 mL balloon, which is what he has had in the past.  They are still able to use the tube for nutrition, medication, and hydration.    Past Medical History:  Diagnosis Date  . ALS (amyotrophic lateral sclerosis) ([place])   . Diabetes mellitus without complication ([place])   . Respiratory failure, chronic (HCC)     Past Surgical History:  Procedure Laterality Date  . APPENDECTOMY    . fractured hands    . PEG PLACEMENT N/A 04/19/2017   Procedure: PERCUTANEOUS ENDOSCOPIC GASTROSTOMY (PEG) REPLACEMENT;  Surgeon: Jonathon Bellows, MD;  Location: [facility] ENDOSCOPY;  Service: Endoscopy;  Laterality: N/A;  . PEG PLACEMENT N/A 04/19/2017   Procedure: PERCUTANEOUS ENDOSCOPIC GASTROSTOMY (PEG) PLACEMENT;  Surgeon: Jonathon Bellows, MD;  Location: [facility] ENDOSCOPY;  Service: Gastroenterology;  Laterality: N/A;  . PEG TUBE PLACEMENT Left   . TRACHEOSTOMY      Family History  Problem Relation Age of Onset  . Heart disease Mother   . ALS Father     Social History Social History   Tobacco Use  . Smoking status: Former Smoker    Packs/day: 1.00    Types: Cigarettes    Quit date: 10/14/2015    Years since quitting: 4.0  .  Smokeless tobacco: Never Used  Substance Use Topics  . Alcohol use: No    Comment: occ  . Drug use: No    Allergies  Allergen Reactions  . Debrox [Carbamide Peroxide]   . Latex     Current Outpatient Medications  Medication Sig Dispense Refill  . acetaminophen (TYLENOL) 325 MG tablet Take 2 tablets (650 mg total) by mouth every 6 (six) hours as needed for mild pain (or Fever >/= 101). 1 tablet 0  . baclofen (LIORESAL) 10 MG tablet Place 10 mg into feeding tube every 8 (eight) hours as needed for muscle spasms.    . bisacodyl (DULCOLAX) 10 MG suppository Place 10 mg rectally as needed for moderate constipation.    . busPIRone (BUSPAR) 10 MG tablet Place 10 mg into feeding tube 3 (three) times daily.    . chlorhexidine gluconate, SAGE KIT, (PERIDEX) 0.12 % solution 15 mLs by Mouth Rinse route 2 (two) times daily. 120 mL 0  . diazepam (VALIUM) 5 MG tablet Take 5 mg by mouth every 12 (twelve) hours as needed for anxiety.    . famotidine (PEPCID) 20 MG tablet Take 20 mg by mouth 2 (two) times daily.    . insulin NPH Human (NOVOLIN N) 100 UNIT/ML injection Inject 28 Units into the skin 2 (two) times daily.     . Nutritional Supplements (FEEDING  SUPPLEMENT, OSMOLITE 1.5 CAL,) LIQD Place 237 mLs into feeding tube every 4 (four) hours. 10000 mL 0  . Oxycodone HCl 10 MG TABS Place 10 mg into feeding tube every 4 (four) hours as needed.    . Oxycodone HCl 20 MG TABS 25 mg by PEG Tube route every 6 (six) hours as needed.     . polyethylene glycol (MIRALAX / GLYCOLAX) packet Place 17 g into feeding tube daily.    Marland Kitchen scopolamine (TRANSDERM-SCOP) 1 MG/3DAYS Place 1 patch (1.5 mg total) onto the skin every 3 (three) days. 10 patch 12  . silver nitrate applicators 30-86 % applicator Two to three sticks every two-three days as needed for granulation tissue. 100 each prn  . Skin Protectants, Misc. (EUCERIN) cream Apply 1 application topically 2 (two) times daily.    . sodium phosphate (FLEET) 7-19 GM/118ML  ENEM Place 133 mLs (1 enema total) rectally once as needed for severe constipation. 1000 mL 0  . tamsulosin (FLOMAX) 0.4 MG CAPS capsule Take 0.4 mg by mouth as needed.    Marland Kitchen tiZANidine (ZANAFLEX) 4 MG tablet Place 4 mg into feeding tube every 8 (eight) hours as needed for muscle spasms.    Marland Kitchen venlafaxine (EFFEXOR) 75 MG tablet Take 75 mg by mouth every morning.    . venlafaxine XR (EFFEXOR-XR) 150 MG 24 hr capsule Take 150 mg by mouth every evening.    . Water For Irrigation, Sterile (FREE WATER) SOLN Place 200 mLs into feeding tube every 8 (eight) hours. 1000 mL 0  . zolpidem (AMBIEN) 5 MG tablet Take 5 mg by mouth at bedtime as needed for sleep.     No current facility-administered medications for this visit.    Review of Systems Review of Systems  Unable to perform ROS: Other  ALS  Blood pressure 122/85, pulse 96, temperature 97.9 F (36.6 C), temperature source Temporal, resp. rate 18, height 5' 6"  (1.676 m), weight 174 lb 12.8 oz (79.3 kg), SpO2 98 %.  Physical Exam Physical Exam Constitutional:      General: He is not in acute distress.    Comments: Nonverbal, lying on a stretcher.  HENT:     Mouth/Throat:     Comments: Covered with a mask secondary to COVID-19 precautions Eyes:     General: No scleral icterus.       Right eye: No discharge.        Left eye: No discharge.  Neck:     Comments: Tracheostomy in place Cardiovascular:     Rate and Rhythm: Normal rate and regular rhythm.  Pulmonary:     Effort: No respiratory distress.  Abdominal:     Palpations: Abdomen is soft.     Comments: The granulation tissue surrounding the G-tube site has improved markedly, however the tissue does appear to be still somewhat friable..  There is substantial play around the tube.  The flange is cinched appropriately against the abdominal wall.  The current tube is a 56 [place] with a 10 mL balloon.  There is no evidence of leakage, drainage, or other tube malfunction.  Neurological:      Comments: He appears to be at his baseline.  Psychiatric:     Comments: Unable to assess      Data Reviewed I reviewed Dr. Georgeann Oppenheim note from 10/08/2019.  That note reports that the patient was at his office to have the G-tube replaced.  The prior tube was 93 [place] and was apparently replaced with a 20 [place].  The balloon was filled with 6 cc of water.  At that point, Dr. Vicente Males describes leakage coming from the sides of the tube and he thought that perhaps a larger diameter tube would need to be placed.  At that point, he got in touch with me to see if a suture could be placed at the site.  Assessment This is a 44 year old man with amyotrophic lateral sclerosis.  He is dependent on a gastrostomy tube for all of his medications, nutrition, and hydration.  I was asked to place a pursestring suture around the tube due to leakage, but on my assessment today, the tissue surrounding the tube is far too friable and would not hold a suture well.  In addition, placement of a suture is simply a temporizing measure that would not resolve the issue of the enlarging tract.  Plan After evaluating the site, I contacted Dr. Vicente Males.  I explained to him that a pursestring suture was not going to be an effective remedy to this.  I recommended that he consider removing the G-tube for 24 to 48 hours to allow the tract to partially close and then replace it.  2 ensure that Randy Roach received all of his necessary medications and nutrition, I suggested placement of a nasojejunal feeding tube as a temporizing measure.  Dr. Vicente Males had planned to admit the patient to the hospital in order to facilitate this.  In speaking with the patient's mother and his caregiver, however, it sounds like there have been difficulties in the past with placing anything via the patient's esophagus.  They report that "everything is collapsed up there" due to the ALS.  I spent greater than 50% of the approximately 45 minute visit with the patient, his  caregiver, and his mother discussing the options and attempting to coordinate his care.  I also mentioned the possibility of parenteral access for medications, nutrition, and hydration.  At the time of our visit terminated, Dr. Vicente Males was planning to arrange admission with the hospitalist service.  I later received notification that the patient had declined admission and would wait for a larger gastrostomy tube.  I do have concerns that continual upsizing of the tube will simply cause the sinus tract to continue to enlarge, potentially resulting in no further commercially available options.  The alternative, however, of having a tube free interval without any other access is also unlikely to be feasible, in light of the patient's extensive debility.  I have not scheduled a return visit, but certainly, I am happy to help with any future issues that may arise.    Fredirick Maudlin 10/14/2019, 12:11 PM

## 2019-10-16 ENCOUNTER — Telehealth: Payer: Self-pay | Admitting: Gastroenterology

## 2019-10-16 NOTE — Telephone Encounter (Signed)
Randy Roach left vm for Dr. Johnney Killian nurse  Randy Roach pt is at [facility] in [place] and she wanted to see if Dr. Tobi Bastos would call up there to see if they can replace the G-tube while he is there please call her at [phone]

## 2019-10-16 NOTE — Telephone Encounter (Signed)
Spoke with pt caregiver, June, she informed me that pt is currently at [facility] ED being seen for elevated blood pressure and pneumonia. She states the physician that's currently treating pt states they have to address pt's acute issues before deciding to change the g-tube. She states she's picked up the G-tube from [facility] and now plans to have pt's home health nurse change the tube when pt is back home, she states if nurse is not able to change she'll contact our office to schedule with Dr. Tobi Bastos to have tube changed. Dr. Tobi Bastos agrees.

## 2019-11-27 MED ORDER — ENOXAPARIN SODIUM 40 MG/0.4ML ~~LOC~~ SOLN
40.00 | SUBCUTANEOUS | Status: DC
Start: 2019-12-02 — End: 2019-11-27

## 2019-11-27 MED ORDER — VENLAFAXINE HCL 75 MG PO TABS
150.00 | ORAL_TABLET | ORAL | Status: DC
Start: 2019-12-01 — End: 2019-11-27

## 2019-11-27 MED ORDER — SENNOSIDES 8.6 MG PO TABS
2.00 | ORAL_TABLET | ORAL | Status: DC
Start: ? — End: 2019-11-27

## 2019-11-27 MED ORDER — SODIUM CHLORIDE 3 % IN NEBU
4.00 | INHALATION_SOLUTION | RESPIRATORY_TRACT | Status: DC
Start: 2019-12-01 — End: 2019-11-27

## 2019-11-27 MED ORDER — TIZANIDINE HCL 4 MG PO TABS
4.00 | ORAL_TABLET | ORAL | Status: DC
Start: 2019-12-01 — End: 2019-11-27

## 2019-11-27 MED ORDER — GLYCERIN (ADULT) 2 G RE SUPP
1.00 | RECTAL | Status: DC
Start: ? — End: 2019-11-27

## 2019-11-27 MED ORDER — BUSPIRONE HCL 10 MG PO TABS
10.00 | ORAL_TABLET | ORAL | Status: DC
Start: 2019-12-01 — End: 2019-11-27

## 2019-11-27 MED ORDER — INSULIN NPH (HUMAN) (ISOPHANE) 100 UNIT/ML ~~LOC~~ SUSP
40.00 | SUBCUTANEOUS | Status: DC
Start: 2019-12-01 — End: 2019-11-27

## 2019-11-27 MED ORDER — BACLOFEN 10 MG PO TABS
10.00 | ORAL_TABLET | ORAL | Status: DC
Start: 2019-12-01 — End: 2019-11-27

## 2019-11-27 MED ORDER — HYDROCORTISONE 1 % EX CREA
TOPICAL_CREAM | CUTANEOUS | Status: DC
Start: 2019-12-01 — End: 2019-11-27

## 2019-11-27 MED ORDER — DIAZEPAM 5 MG PO TABS
5.00 | ORAL_TABLET | ORAL | Status: DC
Start: 2019-12-01 — End: 2019-11-27

## 2019-11-27 MED ORDER — OXYCODONE HCL 5 MG PO TABS
25.00 | ORAL_TABLET | ORAL | Status: DC
Start: 2019-12-01 — End: 2019-11-27

## 2019-11-27 MED ORDER — VENLAFAXINE HCL 75 MG PO TABS
75.00 | ORAL_TABLET | ORAL | Status: DC
Start: 2019-12-02 — End: 2019-11-27

## 2019-11-27 MED ORDER — FAMOTIDINE 20 MG PO TABS
20.00 | ORAL_TABLET | ORAL | Status: DC
Start: 2019-12-01 — End: 2019-11-27

## 2019-11-27 MED ORDER — NUTRISOURCE FIBER PO PACK
1.00 | PACK | ORAL | Status: DC
Start: 2019-12-01 — End: 2019-11-27

## 2019-11-27 MED ORDER — NYSTATIN 100000 UNIT/GM EX CREA
1.00 | TOPICAL_CREAM | CUTANEOUS | Status: DC
Start: 2019-12-01 — End: 2019-11-27

## 2019-11-27 MED ORDER — SCOPOLAMINE 1 MG/3DAYS TD PT72
1.00 | MEDICATED_PATCH | TRANSDERMAL | Status: DC
Start: 2019-12-04 — End: 2019-11-27

## 2019-11-27 MED ORDER — INSULIN LISPRO 100 UNIT/ML ~~LOC~~ SOLN
0.00 | SUBCUTANEOUS | Status: DC
Start: 2019-12-01 — End: 2019-11-27

## 2019-11-27 MED ORDER — DEXTROSE 50 % IV SOLN
12.50 | INTRAVENOUS | Status: DC
Start: ? — End: 2019-11-27

## 2019-11-27 MED ORDER — POLYETHYLENE GLYCOL 3350 17 GM/SCOOP PO POWD
17.00 | ORAL | Status: DC
Start: ? — End: 2019-11-27

## 2019-12-01 MED ORDER — OXYCODONE HCL 5 MG PO TABS
5.00 | ORAL_TABLET | ORAL | Status: DC
Start: ? — End: 2019-12-01

## 2019-12-17 ENCOUNTER — Other Ambulatory Visit: Payer: Medicare Other | Admitting: Adult Health Nurse Practitioner

## 2019-12-17 DIAGNOSIS — G1221 Amyotrophic lateral sclerosis: Secondary | ICD-10-CM

## 2019-12-17 DIAGNOSIS — Z515 Encounter for palliative care: Secondary | ICD-10-CM

## 2019-12-17 NOTE — Progress Notes (Addendum)
[facility] Consult Note Telephone: [phone]  Fax: [phone]  PATIENT NAME: Randy Roach DOB: March 20, 1976 MRN: [id number]  PRIMARY CARE PROVIDER:   Montel Clock, MD  REFERRING PROVIDER:  Montel Clock, MD [id number] [facility] [place],  [place] [address]  RESPONSIBLE PARTY:   Vanetta Mulders, brother/HCPOA   June Taylor, [place]:  [phone] C: [phone]    RECOMMENDATIONS and PLAN:  1.  Advanced care planning.  Patient is a DNR.  Patient has MOST completed indicating DNR, limited interventions, antibiotics and IV fluids as indicated.  Uploaded MOST to [employer].  2.  ALS.  Patient is bedbound requiring total care.  He is nonverbal.  He is able to answer yes/no questions with his eyes.  He will blink for yes and not blink for no. He receives all nutrition and meds through PEG tube. He has trach with ventilator at 4L. He recently had a 2 month hospital stay which was complicated by several infections including pneumonia and UTI.  Since he has been home he been requiring in/out catheterization every 8 hours due to urinary retention.  He has orders for foley catheter to be placed and awaiting supplies. He has appointment with urology on 01/07/20.Patient has elevated HR that stays around 118 due to his ALS and will spike a fever that will come down.  PCP manages patient's ALS. Patient is not on specific ALS medications.  Patient mostly receives mediations for comfort, such as oxy and muscle relaxers for pain and miralax for constipation. Patient has mild excoriation to buttocks but no pressure injury. Continue supportive care  3.  Goals of care.  Patient lives with his aunt and has a caregiver from [place] who comes in for 12 hours 3 days a week.  There has been discussion for hospice services but family would lose [facility] services which is a big help for the aunt. Patient indicated yesterday with his PCP  that he did not want to go back to the hospital when his heart rate went up to 220.  Today patient indicates that if needed he would like to be treated at the hospital.  Discussed at length with his aunt keeping palliative involved to keep the patient comfortable at home and working with his PCP to keep him at home as much as possible.  Palliative care will remain involved to monitor when decline would warrant transition to inpatient hospice. The aunt in agreement with this plan. Palliative will monitor for symptom management/decline and make recommendations as needed.  Next appointment is in 3 weeks.  Of note [facility] nurse stated that we do need send any new orders through them.  Attention to [employer], Publishing copy.  Phone: [phone] fax: [phone]  Email: [email or web address]  I spent 90 minutes providing this consultation,  from 12:15 to 1:45 including time with patient/family, chart review, provider coordination, and documentation. More than 50% of the time in this consultation was spent coordinating communication.   HISTORY OF PRESENT ILLNESS:  Randy Roach is a 44 y.o. year old male with multiple medical problems including ALS, DMT2, anxiety, depression, chronic neuromuscular respiratory failure, urinary retention. Palliative Care was asked to help address goals of care.   CODE STATUS: DNR  PPS: 20% HOSPICE ELIGIBILITY/DIAGNOSIS: TBD  PHYSICAL EXAM:  HR 113  O2 98% on 4 L via trach with  General: NAD, frail appearing, thin Extremities: no edema, no joint deformities Skin: no rashes  Neurological: bedbound; unable to speak; can use eyes to answer yes/no questions   PAST MEDICAL HISTORY:  Past Medical History:  Diagnosis Date  . ALS (amyotrophic lateral sclerosis) ([place])   . Diabetes mellitus without complication ([place])   . Respiratory failure, chronic ([place])     SOCIAL HX:  Social History   Tobacco Use  . Smoking status: Former Smoker    Packs/day: 1.00     Types: Cigarettes    Quit date: 10/14/2015    Years since quitting: 4.1  . Smokeless tobacco: Never Used  Substance Use Topics  . Alcohol use: No    Comment: occ    ALLERGIES:  Allergies  Allergen Reactions  . Debrox [Carbamide Peroxide]   . Latex      PERTINENT MEDICATIONS:  Outpatient Encounter Medications as of 12/17/2019  Medication Sig  . acetaminophen (TYLENOL) 325 MG tablet Take 2 tablets (650 mg total) by mouth every 6 (six) hours as needed for mild pain (or Fever >/= 101).  . baclofen (LIORESAL) 10 MG tablet Place 10 mg into feeding tube every 8 (eight) hours as needed for muscle spasms.  . bisacodyl (DULCOLAX) 10 MG suppository Place 10 mg rectally as needed for moderate constipation.  . busPIRone (BUSPAR) 10 MG tablet Place 10 mg into feeding tube 3 (three) times daily.  . chlorhexidine gluconate, SAGE KIT, (PERIDEX) 0.12 % solution 15 mLs by Mouth Rinse route 2 (two) times daily.  . diazepam (VALIUM) 5 MG tablet Take 5 mg by mouth every 12 (twelve) hours as needed for anxiety.  . famotidine (PEPCID) 20 MG tablet Take 20 mg by mouth 2 (two) times daily.  . insulin NPH Human (NOVOLIN N) 100 UNIT/ML injection Inject 28 Units into the skin 2 (two) times daily.   . Nutritional Supplements (FEEDING SUPPLEMENT, OSMOLITE 1.5 CAL,) LIQD Place 237 mLs into feeding tube every 4 (four) hours.  . Oxycodone HCl 10 MG TABS Place 10 mg into feeding tube every 4 (four) hours as needed.  . Oxycodone HCl 20 MG TABS 25 mg by PEG Tube route every 6 (six) hours as needed.   . polyethylene glycol (MIRALAX / GLYCOLAX) packet Place 17 g into feeding tube daily.  Marland Kitchen scopolamine (TRANSDERM-SCOP) 1 MG/3DAYS Place 1 patch (1.5 mg total) onto the skin every 3 (three) days.  . silver nitrate applicators 16-38 % applicator Two to three sticks every two-three days as needed for granulation tissue.  . Skin Protectants, Misc. (EUCERIN) cream Apply 1 application topically 2 (two) times daily.  . sodium  phosphate (FLEET) 7-19 GM/118ML ENEM Place 133 mLs (1 enema total) rectally once as needed for severe constipation.  . tamsulosin (FLOMAX) 0.4 MG CAPS capsule Take 0.4 mg by mouth as needed.  Marland Kitchen tiZANidine (ZANAFLEX) 4 MG tablet Place 4 mg into feeding tube every 8 (eight) hours as needed for muscle spasms.  Marland Kitchen venlafaxine (EFFEXOR) 75 MG tablet Take 75 mg by mouth every morning.  . venlafaxine XR (EFFEXOR-XR) 150 MG 24 hr capsule Take 150 mg by mouth every evening.  . Water For Irrigation, Sterile (FREE WATER) SOLN Place 200 mLs into feeding tube every 8 (eight) hours.  Marland Kitchen zolpidem (AMBIEN) 5 MG tablet Take 5 mg by mouth at bedtime as needed for sleep.   No facility-administered encounter medications on file as of 12/17/2019.     Sammy Douthitt Jenetta Downer, NP

## 2019-12-18 ENCOUNTER — Other Ambulatory Visit: Payer: Self-pay

## 2020-01-08 ENCOUNTER — Other Ambulatory Visit: Payer: Medicare Other | Admitting: Adult Health Nurse Practitioner

## 2020-01-08 ENCOUNTER — Other Ambulatory Visit: Payer: Self-pay

## 2020-01-08 DIAGNOSIS — G1221 Amyotrophic lateral sclerosis: Secondary | ICD-10-CM

## 2020-01-08 DIAGNOSIS — Z515 Encounter for palliative care: Secondary | ICD-10-CM

## 2020-01-08 NOTE — Progress Notes (Signed)
[facility] Consult Note Telephone: [phone]  Fax: [phone]  PATIENT NAME: Randy Roach DOB: [birth year] MRN: [id number]  PRIMARY CARE PROVIDER:   Montel Clock, MD  REFERRING PROVIDER:  Montel Clock, MD [id number] [facility] [place],  [place] [address]  RESPONSIBLE PARTY:   Vanetta Mulders, brother   June Taylor, aunt/caregiver  H:  [phone] C: [phone]    RECOMMENDATIONS and PLAN:  1.  Advanced care planning.  Patient is a DNR.  Patient has MOST completed indicating DNR, limited interventions, antibiotics and IV fluids as indicated  2.  ALS.  Patient is bedbound requiring total care.  He is nonverbal.  He is able to answer yes/no questions with his eyes.  He will blink for yes and not blink for no. He receives all nutrition and meds through PEG tube. He has trach with ventilator at 4L.  Patient has been having urinary retention and requiring in/out caths every 8 hours.  He had urology appointment and will be having a suprapubic catheter placed on 01/23/20.    3.  Pain.  Patient has occasional pain and will get relief with as needed tylenol for moderate pain and as needed oxycodone 10 mg for more severe pain.  Continue current pain regimen  4.  Support.  Aunt is primary caregiver and had been receiving in home caretakers through [facility].  The home health RN that had been coming through the day had left.  She still gets help in the evening through [facility] but right now they are short staffed and do not have the RNs to fill the spot during the day.  Aunt is overwhelmed.  Will reach out to SW for resources that may be able to provide extra help in the home.    Patient is stable right now.  No reported fevers, N/V/D, constipation.  Palliative will continue to monitor for symptom management/decline and make recommendations as needed.  Will call in 6 weeks to see if visit is needed.  Aunt encouraged to  call with any concerns  I spent 90 minutes providing this consultation,  from 11:00 to 12:30  including time with patient/family, chart review, provider coordination, and documentation. More than 50% of the time in this consultation was spent coordinating communication.   HISTORY OF PRESENT ILLNESS:  Randy Roach is a 44 y.o. year old male with multiple medical problems including ALS, DMT2, anxiety, depression, chronic neuromuscular respiratory failure, urinary retention. Palliative Care was asked to help address goals of care.   CODE STATUS: DNR  PPS: 20% HOSPICE ELIGIBILITY/DIAGNOSIS: TBD  PHYSICAL EXAM:  HR 100  O2 99% on 4 L via trach General: NAD, frail appearing, thin Cardiovascular: regular rate and rhythm Pulmonary: lung sounds clear Extremities: trace edema to bilateral feet, no joint deformities Skin: no rashes Neurological: bedbound; unable to speak; can use eyes to answer yes/no questions  PAST MEDICAL HISTORY:  Past Medical History:  Diagnosis Date  . ALS (amyotrophic lateral sclerosis) ([place])   . Diabetes mellitus without complication ([place])   . Respiratory failure, chronic ([place])     SOCIAL HX:  Social History   Tobacco Use  . Smoking status: Former Smoker    Packs/day: 1.00    Types: Cigarettes    Quit date: 10/14/2015    Years since quitting: 4.2  . Smokeless tobacco: Never Used  Substance Use Topics  . Alcohol use: No    Comment: occ  ALLERGIES:  Allergies  Allergen Reactions  . Debrox [Carbamide Peroxide]   . Latex      PERTINENT MEDICATIONS:  Outpatient Encounter Medications as of 01/08/2020  Medication Sig  . acetaminophen (TYLENOL) 325 MG tablet Take 2 tablets (650 mg total) by mouth every 6 (six) hours as needed for mild pain (or Fever >/= 101).  . baclofen (LIORESAL) 10 MG tablet Place 10 mg into feeding tube every 8 (eight) hours as needed for muscle spasms.  . bisacodyl (DULCOLAX) 10 MG suppository Place 10 mg rectally as needed for  moderate constipation.  . busPIRone (BUSPAR) 10 MG tablet Place 10 mg into feeding tube 3 (three) times daily.  . chlorhexidine gluconate, SAGE KIT, (PERIDEX) 0.12 % solution 15 mLs by Mouth Rinse route 2 (two) times daily.  . diazepam (VALIUM) 5 MG tablet Take 5 mg by mouth every 12 (twelve) hours as needed for anxiety.  . famotidine (PEPCID) 20 MG tablet Take 20 mg by mouth 2 (two) times daily.  . insulin NPH Human (NOVOLIN N) 100 UNIT/ML injection Inject 28 Units into the skin 2 (two) times daily.   . Nutritional Supplements (FEEDING SUPPLEMENT, OSMOLITE 1.5 CAL,) LIQD Place 237 mLs into feeding tube every 4 (four) hours.  . Oxycodone HCl 10 MG TABS Place 10 mg into feeding tube every 4 (four) hours as needed.  . Oxycodone HCl 20 MG TABS 25 mg by PEG Tube route every 6 (six) hours as needed.   . polyethylene glycol (MIRALAX / GLYCOLAX) packet Place 17 g into feeding tube daily.  Marland Kitchen scopolamine (TRANSDERM-SCOP) 1 MG/3DAYS Place 1 patch (1.5 mg total) onto the skin every 3 (three) days.  . silver nitrate applicators 30-14 % applicator Two to three sticks every two-three days as needed for granulation tissue.  . Skin Protectants, Misc. (EUCERIN) cream Apply 1 application topically 2 (two) times daily.  . sodium phosphate (FLEET) 7-19 GM/118ML ENEM Place 133 mLs (1 enema total) rectally once as needed for severe constipation.  . tamsulosin (FLOMAX) 0.4 MG CAPS capsule Take 0.4 mg by mouth as needed.  Marland Kitchen tiZANidine (ZANAFLEX) 4 MG tablet Place 4 mg into feeding tube every 8 (eight) hours as needed for muscle spasms.  Marland Kitchen venlafaxine (EFFEXOR) 75 MG tablet Take 75 mg by mouth every morning.  . venlafaxine XR (EFFEXOR-XR) 150 MG 24 hr capsule Take 150 mg by mouth every evening.  . Water For Irrigation, Sterile (FREE WATER) SOLN Place 200 mLs into feeding tube every 8 (eight) hours.  Marland Kitchen zolpidem (AMBIEN) 5 MG tablet Take 5 mg by mouth at bedtime as needed for sleep.   No facility-administered encounter  medications on file as of 01/08/2020.      Elidia Bonenfant Jenetta Downer, NP

## 2020-01-10 ENCOUNTER — Emergency Department: Payer: Medicare Other

## 2020-01-10 ENCOUNTER — Emergency Department
Admission: EM | Admit: 2020-01-10 | Discharge: 2020-01-10 | Disposition: A | Discharge status: Home / Self Care | Payer: Medicare Other | Attending: Student in an Organized Health Care Education/Training Program | Admitting: Student in an Organized Health Care Education/Training Program

## 2020-01-10 ENCOUNTER — Other Ambulatory Visit: Payer: Self-pay

## 2020-01-10 DIAGNOSIS — T85528A Displacement of other gastrointestinal prosthetic devices, implants and grafts, initial encounter: Secondary | ICD-10-CM | POA: Diagnosis present

## 2020-01-10 DIAGNOSIS — E119 Type 2 diabetes mellitus without complications: Secondary | ICD-10-CM | POA: Diagnosis not present

## 2020-01-10 DIAGNOSIS — Z79899 Other long term (current) drug therapy: Secondary | ICD-10-CM | POA: Diagnosis not present

## 2020-01-10 DIAGNOSIS — Z794 Long term (current) use of insulin: Secondary | ICD-10-CM | POA: Insufficient documentation

## 2020-01-10 DIAGNOSIS — Y733 Surgical instruments, materials and gastroenterology and urology devices (including sutures) associated with adverse incidents: Secondary | ICD-10-CM | POA: Insufficient documentation

## 2020-01-10 DIAGNOSIS — Z87891 Personal history of nicotine dependence: Secondary | ICD-10-CM | POA: Diagnosis not present

## 2020-01-10 MED ORDER — IOHEXOL 300 MG/ML  SOLN
50.0000 mL | Freq: Once | INTRAMUSCULAR | Status: AC | PRN
  Administered 2020-01-10: 10:00:00 50 mL

## 2020-01-10 NOTE — ED Provider Notes (Signed)
[facility] Emergency Department Provider Note    First MD Initiated Contact with Patient 01/10/20 0848     (approximate)  I have reviewed the triage vital signs and the nursing notes.   HISTORY  Chief Complaint g-tube displaced    HPI Randy Roach is a 44 y.o. male the below listed past medical history presents to the ER for evaluation of displaced G-tube.  G-tube fell out at some point overnight.  For member attempted replacement but it would not stay.  No other complaints.  He is on home vent and trach and PEG dependent.    Past Medical History:  Diagnosis Date  . ALS (amyotrophic lateral sclerosis) ([place])   . Diabetes mellitus without complication ([place])   . Respiratory failure, chronic (HCC)    Family History  Problem Relation Age of Onset  . Heart disease Mother   . ALS Father    Past Surgical History:  Procedure Laterality Date  . APPENDECTOMY    . fractured hands    . PEG PLACEMENT N/A 04/19/2017   Procedure: PERCUTANEOUS ENDOSCOPIC GASTROSTOMY (PEG) REPLACEMENT;  Surgeon: Jonathon Bellows, MD;  Location: [facility] ENDOSCOPY;  Service: Endoscopy;  Laterality: N/A;  . PEG PLACEMENT N/A 04/19/2017   Procedure: PERCUTANEOUS ENDOSCOPIC GASTROSTOMY (PEG) PLACEMENT;  Surgeon: Jonathon Bellows, MD;  Location: [facility] ENDOSCOPY;  Service: Gastroenterology;  Laterality: N/A;  . PEG TUBE PLACEMENT Left   . TRACHEOSTOMY     Patient Active Problem List   Diagnosis Date Noted  . Diabetes mellitus, type 2 ([place]) 10/14/2019  . S/P percutaneous endoscopic gastrostomy (PEG) tube placement ([place]) 09/11/2019  . Hypertrophic granulation tissue 07/24/2019  . Medication management 04/19/2019  . Sputum culture positive for Pseudomonas 10/26/2017  . Skin irritation 03/18/2017  . 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 Drug-induced constipation 01/15/2017  . Left lower lobe pneumonia 12/08/2016  . Hyperglycemia 12/08/2016  . Anemia 12/05/2016  .  Anxiety and depression 12/05/2016  . Sepsis due to pneumonia ([place]) 05/08/2016  . Malnutrition compromising bodily function ([place]) 03/09/2016  . 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 Pain 03/09/2016  . 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 Oropharyngeal dysphagia 08/20/2015      Prior to Admission medications   Medication Sig Start Date End Date Taking? Authorizing Provider  acetaminophen (TYLENOL) 325 MG tablet Take 2 tablets (650 mg total) by mouth every 6 (six) hours as needed for mild pain (or Fever >/= 101). 05/11/16   Flora Lipps, MD  baclofen (LIORESAL) 10 MG tablet Place 10 mg into feeding tube every 8 (eight) hours as needed for muscle spasms.    [provider]  bisacodyl (DULCOLAX) 10 MG suppository Place 10 mg rectally as needed for moderate constipation.    [provider]  busPIRone (BUSPAR) 10 MG tablet Place 10 mg into feeding tube 3 (three) times daily.    [provider]  chlorhexidine gluconate, SAGE KIT, (PERIDEX) 0.12 % solution 15 mLs by Mouth Rinse route 2 (two) times daily. 05/11/16   Flora Lipps, MD  diazepam (VALIUM) 5 MG tablet Take 5 mg by mouth every 12 (twelve) hours as needed for anxiety.    [provider]  famotidine (PEPCID) 20 MG tablet Take 20 mg by mouth 2 (two) times daily. 04/18/19 04/17/20  [provider]  insulin NPH Human (NOVOLIN N) 100 UNIT/ML injection Inject 28 Units into the skin 2 (two) times daily.  05/29/19   [provider]  Nutritional Supplements (FEEDING SUPPLEMENT, OSMOLITE  1.5 CAL,) LIQD Place 237 mLs into feeding tube every 4 (four) hours. 12/12/16   Gladstone Lighter, MD  Oxycodone HCl 10 MG TABS Place 10 mg into feeding tube every 4 (four) hours as needed.    [provider]  Oxycodone HCl 20 MG TABS 25 mg by PEG Tube route every 6 (six) hours as needed.     [provider]  polyethylene glycol (MIRALAX / GLYCOLAX) packet Place 17 g into feeding tube daily.     [provider]  scopolamine (TRANSDERM-SCOP) 1 MG/3DAYS Place 1 patch (1.5 mg total) onto the skin every 3 (three) days. 12/14/16   Gladstone Lighter, MD  silver nitrate applicators 15-94 % applicator Two to three sticks every two-three days as needed for granulation tissue. 07/31/19   Fredirick Maudlin, MD  Skin Protectants, Misc. (EUCERIN) cream Apply 1 application topically 2 (two) times daily.    [provider]  sodium phosphate (FLEET) 7-19 GM/118ML ENEM Place 133 mLs (1 enema total) rectally once as needed for severe constipation. 12/12/16   Gladstone Lighter, MD  tamsulosin (FLOMAX) 0.4 MG CAPS capsule Take 0.4 mg by mouth as needed.    [provider]  tiZANidine (ZANAFLEX) 4 MG tablet Place 4 mg into feeding tube every 8 (eight) hours as needed for muscle spasms.    [provider]  venlafaxine (EFFEXOR) 75 MG tablet Take 75 mg by mouth every morning.    [provider]  venlafaxine XR (EFFEXOR-XR) 150 MG 24 hr capsule Take 150 mg by mouth every evening.    [provider]  Water For Irrigation, Sterile (FREE WATER) SOLN Place 200 mLs into feeding tube every 8 (eight) hours. 12/12/16   Gladstone Lighter, MD  zolpidem (AMBIEN) 5 MG tablet Take 5 mg by mouth at bedtime as needed for sleep.    [provider]    Allergies Debrox [carbamide peroxide] and Latex    Social History Social History   Tobacco Use  . Smoking status: Former Smoker    Packs/day: 1.00    Types: Cigarettes    Quit date: 10/14/2015    Years since quitting: 4.2  . Smokeless tobacco: Never Used  Substance Use Topics  . Alcohol use: No    Comment: occ  . Drug use: No    Review of Systems Patient denies headaches, rhinorrhea, blurry vision, numbness, shortness of breath, chest pain, edema, cough, abdominal pain, nausea, vomiting, diarrhea, dysuria, fevers, rashes or hallucinations unless otherwise stated above in  HPI. ____________________________________________   PHYSICAL EXAM:  VITAL SIGNS: Vitals:   01/10/20 0900 01/10/20 1030  BP: (!) 173/107 (!) 128/96  Pulse: (!) 113 (!) 125  Resp: 19 19  Temp:    SpO2: 100% 100%    Constitutional: Alert, chronically ill appearing Eyes: Conjunctivae are normal.  Head: Atraumatic. Nose: No congestion/rhinnorhea. Mouth/Throat: Mucous membranes are moist.   Neck: No stridor. Painless ROM. Trach in place, no bleeding Cardiovascular: Normal rate, regular rhythm. Grossly normal heart sounds.  Good peripheral circulation. Respiratory: Normal respiratory effort.  No retractions. Lungs CTAB. Gastrointestinal: Soft and nontender.  Gastrostomy site with small amount of blood and irritation of latera aspect,  No surrounding celluliitis or erythema No distention. No abdominal bruits. No CVA tenderness. Genitourinary: normal external genitalia Musculoskeletal: No lower extremity tenderness nor edema.  No joint effusions. Neurologic:  Normal speech and language. No gross focal neurologic deficits are appreciated. No facial droop Skin:  Skin is warm, dry and intact. No rash noted. Psychiatric:  unable to assess  ____________________________________________   LABS (all labs ordered are listed, but only abnormal results are displayed)  No results found for this or any previous visit (from the past 24 hour(s)). ____________________________________________ ____________________________________________  [id number]  I personally reviewed all radiographic images ordered to evaluate for the above acute complaints and reviewed radiology reports and findings.  These findings were personally discussed with the patient.  Please see medical record for radiology report.  ____________________________________________   PROCEDURES  Procedure(s) performed:  FEEDING TUBE REPLACEMENT  Date/Time: 01/10/2020 9:14 AM Performed by: Merlyn Lot, MD Authorized by:  Merlyn Lot, MD  Consent: Verbal consent obtained. Risks and benefits: risks, benefits and alternatives were discussed Indications: tube dislodged Tube type: gastrostomy Patient position: recumbent Procedure type: replacement Tube size: 12 Fr Endoscope used: no Bulb inflation volume: 20 (ml) Bulb inflation fluid: normal saline Placement/position confirmation: x-ray Tube placement difficulty: none       Critical Care performed: no ____________________________________________   INITIAL IMPRESSION / ASSESSMENT AND PLAN / ED COURSE  Pertinent labs & imaging results that were available during my care of the patient were reviewed by me and considered in my medical decision making (see chart for details).   DDX: Displaced tube, perforation, obstruction  KELLON CHALK is a 44 y.o. who presents to the ED with displaced G-tube.  Patient otherwise at baseline.  Chronically ill but no other concerns from family.  Will replace g tube.    Clinical Course as of Jan 10 1319  Sat Jan 10, 2020  1053 G-tube confirmed in appropriate position.  Discussed mild tachycardia with patient and caregiver.  States that this is typical for him.  Suspect this is secondary to the patient being several hours without his routine pain medication.  He does not have any hypoxia.  He is otherwise afebrile.  She no further concerns states he appears at his baseline.  Do not feel that further diagnostic testing clinically indicated.  We will give his scheduled meds.  Patient cleared for outpatient follow-up.   [PR]    Clinical Course User Index [PR] Merlyn Lot, MD    The patient was evaluated in Emergency Department today for the symptoms described in the history of present illness. He/she was evaluated in the context of the global COVID-19 pandemic, which necessitated consideration that the patient might be at risk for infection with the SARS-CoV-2 virus that causes COVID-19. Institutional protocols  and algorithms that pertain to the evaluation of patients at risk for COVID-19 are in a state of rapid change based on information released by regulatory bodies including the CDC and federal and state organizations. These policies and algorithms were followed during the patient's care in the ED.  As part of my medical decision making, I reviewed the following data within the [facility] notes reviewed and incorporated, Labs reviewed, notes from prior ED visits and [place] Controlled Substance Database   ____________________________________________   FINAL CLINICAL IMPRESSION(S) / ED DIAGNOSES  Final diagnoses:  Dislodged gastrostomy tube      NEW MEDICATIONS STARTED DURING THIS VISIT:  Discharge Medication List as of 01/10/2020 10:42 AM       Note:  This document was prepared using Dragon voice recognition software and may include unintentional dictation errors.    Merlyn Lot, MD 01/10/20 1320

## 2020-01-10 NOTE — ED Notes (Signed)
Pt caregiver/aunt uses blinking to judge pain scale. Aunt reports current pain at a 4-5/10. Pt aunt also reports pt HR is normally between 100-120 in the am.

## 2020-01-10 NOTE — ED Notes (Signed)
MD states to notify pt caregiver that medications can be administered through gtube at this time. Caregiver currently administering home medications with MD approval at this time.

## 2020-01-10 NOTE — ED Triage Notes (Signed)
pt arrives via ems from home. EMS reports pt g-tube displaced on 3.5L oxygen. trach tube pt using home ventilater. pt caregiver states noticed gtube trying to come out last night, but didnt it out until this am sometime after 2 am.   cbg 213 BP 132/78 hr 85

## 2020-01-10 NOTE — Discharge Instructions (Addendum)
Please follow up with Dr. Tobi Bastos.

## 2020-01-27 ENCOUNTER — Telehealth: Payer: Self-pay

## 2020-01-27 NOTE — Telephone Encounter (Signed)
SW received referral from Amy, NP regarding care options in the home. SW contacted June (patient's aunt) to discuss. June provided brief history and overview. Patient is currently receiving care from [facility] for 12 hours per day. June reports that she and a friend are rotating care outside of the hours that [facility] is providing. June noted caregiver fatigue but commitment to caring for patient. SW provided emotional support, validated feelings and discussed options for additional support. SW contacted Candelaria Arenas with [employer] regarding respite care. Debarah Crape advised that patient/family can submit a request for funds to assist with respite care cost. SW will mail form to June. SW to follow-up with Amy, NP.

## 2020-02-19 ENCOUNTER — Other Ambulatory Visit: Payer: Self-pay

## 2020-02-19 ENCOUNTER — Other Ambulatory Visit: Payer: Medicare Other | Admitting: Adult Health Nurse Practitioner

## 2020-02-19 DIAGNOSIS — G1221 Amyotrophic lateral sclerosis: Secondary | ICD-10-CM

## 2020-02-19 DIAGNOSIS — Z515 Encounter for palliative care: Secondary | ICD-10-CM

## 2020-02-19 NOTE — Progress Notes (Signed)
[facility] Consult Note Telephone: [phone]  Fax: [phone]  PATIENT NAME: Randy Roach DOB: [birth year] MRN: [id number]  PRIMARY CARE PROVIDER:   Montel Clock, MD  REFERRING PROVIDER:  Montel Clock, MD [id number] [facility] [place],  [place] [address]  RESPONSIBLE PARTY:  Vanetta Mulders, brother  June Taylor, aunt/caregiver H: [phone] C: [phone]        RECOMMENDATIONS and PLAN:  1.  Advanced care planning.  Patient is a DNR. Patient has MOST completed indicating DNR, limited interventions, antibiotics and IV fluids as indicated  2. ALS. Patient is bedbound requiring total care. He is nonverbal. He is able to answer yes/no questions with his eyes. He will blink for yes and not blink for no. He receives all nutrition and meds through PEG tube. He has trach with ventilator at 4L.  He was seen in the ER on 01/10/20 for PEG tube dislodgement. It has been working fine since.  Patient had suprapubic catheter placed on 01/23/20.  He has clear yellow urine noted in bag.  His aunt states that he goes back to have the suprapubic catheter changed on 02/27/20 at urology.  Continue follow up and recommendations by urology.  Continue supportive care at home.  3.  Support.  Aunt states that she is starting to get daily in home RN through [facility].  She has been contacted by SW and knows how to contact her if she needs further assistance.  We discussed who to call after hours if any significant event occurs in which she does not want to take him to the hospital.   Patient is stable right now.  No reported fevers, N/V/D, constipation.  Palliative will continue to monitor for symptom management/decline and make recommendations as needed.  have next appointment in 4 weeks.  Aunt encouraged to call with any concerns  I spent 50 minutes providing this consultation,  from 11:00 to 11:50 including time with  patient/family, chart review, provider coordination, and documentation. More than 50% of the time in this consultation was spent coordinating communication. Patient has had suprapubic catheter  HISTORY OF PRESENT ILLNESS:  Randy Roach is a 44 y.o. year old male with multiple medical problems including ALS, DMT2, anxiety, depression, chronic neuromuscular respiratory failure, urinary retention. Palliative Care was asked to help address goals of care.   CODE STATUS: DNR  PPS: 20% HOSPICE ELIGIBILITY/DIAGNOSIS: TBD  PHYSICAL EXAM:  HR  100  O2 96% on ventilator on 4L General: NAD, frail appearing, thin Cardiovascular: regular rate and rhythm Pulmonary: lung sounds clear GU:  Suprapubic in place with clear yellow urine in bag Extremities: trace edema to bilateral feet, no joint deformities Skin: no rashes on exposed skin Neurological:bedbound; unable to speak; can use eyes to answer yes/no questions   PAST MEDICAL HISTORY:  Past Medical History:  Diagnosis Date  . ALS (amyotrophic lateral sclerosis) ([place])   . Diabetes mellitus without complication ([place])   . Respiratory failure, chronic ([place])     SOCIAL HX:  Social History   Tobacco Use  . Smoking status: Former Smoker    Packs/day: 1.00    Types: Cigarettes    Quit date: 10/14/2015    Years since quitting: 4.3  . Smokeless tobacco: Never Used  Substance Use Topics  . Alcohol use: No    Comment: occ    ALLERGIES:  Allergies  Allergen Reactions  . Debrox [Carbamide Peroxide]   . Latex  PERTINENT MEDICATIONS:  Outpatient Encounter Medications as of 02/19/2020  Medication Sig  . acetaminophen (TYLENOL) 325 MG tablet Take 2 tablets (650 mg total) by mouth every 6 (six) hours as needed for mild pain (or Fever >/= 101).  . baclofen (LIORESAL) 10 MG tablet Place 10 mg into feeding tube every 8 (eight) hours as needed for muscle spasms.  . bisacodyl (DULCOLAX) 10 MG suppository Place 10 mg rectally as needed for  moderate constipation.  . busPIRone (BUSPAR) 10 MG tablet Place 10 mg into feeding tube 3 (three) times daily.  . chlorhexidine gluconate, SAGE KIT, (PERIDEX) 0.12 % solution 15 mLs by Mouth Rinse route 2 (two) times daily.  . diazepam (VALIUM) 5 MG tablet Take 5 mg by mouth every 12 (twelve) hours as needed for anxiety.  . famotidine (PEPCID) 20 MG tablet Take 20 mg by mouth 2 (two) times daily.  . insulin NPH Human (NOVOLIN N) 100 UNIT/ML injection Inject 28 Units into the skin 2 (two) times daily.   . Nutritional Supplements (FEEDING SUPPLEMENT, OSMOLITE 1.5 CAL,) LIQD Place 237 mLs into feeding tube every 4 (four) hours.  . Oxycodone HCl 10 MG TABS Place 10 mg into feeding tube every 4 (four) hours as needed.  . Oxycodone HCl 20 MG TABS 25 mg by PEG Tube route every 6 (six) hours as needed.   . polyethylene glycol (MIRALAX / GLYCOLAX) packet Place 17 g into feeding tube daily.  Marland Kitchen scopolamine (TRANSDERM-SCOP) 1 MG/3DAYS Place 1 patch (1.5 mg total) onto the skin every 3 (three) days.  . silver nitrate applicators 78-67 % applicator Two to three sticks every two-three days as needed for granulation tissue.  . Skin Protectants, Misc. (EUCERIN) cream Apply 1 application topically 2 (two) times daily.  . sodium phosphate (FLEET) 7-19 GM/118ML ENEM Place 133 mLs (1 enema total) rectally once as needed for severe constipation.  . tamsulosin (FLOMAX) 0.4 MG CAPS capsule Take 0.4 mg by mouth as needed.  Marland Kitchen tiZANidine (ZANAFLEX) 4 MG tablet Place 4 mg into feeding tube every 8 (eight) hours as needed for muscle spasms.  Marland Kitchen venlafaxine (EFFEXOR) 75 MG tablet Take 75 mg by mouth every morning.  . venlafaxine XR (EFFEXOR-XR) 150 MG 24 hr capsule Take 150 mg by mouth every evening.  . Water For Irrigation, Sterile (FREE WATER) SOLN Place 200 mLs into feeding tube every 8 (eight) hours.  Marland Kitchen zolpidem (AMBIEN) 5 MG tablet Take 5 mg by mouth at bedtime as needed for sleep.   No facility-administered encounter  medications on file as of 02/19/2020.    Kimble Delaurentis Jenetta Downer, NP

## 2020-03-15 ENCOUNTER — Telehealth: Payer: Self-pay | Admitting: Adult Health Nurse Practitioner

## 2020-03-15 NOTE — Telephone Encounter (Signed)
Was returning aunt's message.  Left VM with reason for call and call back info Euclide Granito K. Garner Nash NP

## 2020-03-23 ENCOUNTER — Other Ambulatory Visit: Payer: Self-pay

## 2020-03-23 ENCOUNTER — Emergency Department
Admission: EM | Admit: 2020-03-23 | Discharge: 2020-03-23 | Disposition: A | Discharge status: Home / Self Care | Payer: Medicare Other | Attending: Emergency Medicine | Admitting: Emergency Medicine

## 2020-03-23 DIAGNOSIS — Z91040 Latex allergy status: Secondary | ICD-10-CM | POA: Insufficient documentation

## 2020-03-23 DIAGNOSIS — E119 Type 2 diabetes mellitus without complications: Secondary | ICD-10-CM | POA: Diagnosis not present

## 2020-03-23 DIAGNOSIS — Z79899 Other long term (current) drug therapy: Secondary | ICD-10-CM | POA: Diagnosis not present

## 2020-03-23 DIAGNOSIS — G8220 Paraplegia, unspecified: Secondary | ICD-10-CM | POA: Diagnosis not present

## 2020-03-23 DIAGNOSIS — T85528A Displacement of other gastrointestinal prosthetic devices, implants and grafts, initial encounter: Secondary | ICD-10-CM

## 2020-03-23 DIAGNOSIS — Z794 Long term (current) use of insulin: Secondary | ICD-10-CM | POA: Insufficient documentation

## 2020-03-23 DIAGNOSIS — G1221 Amyotrophic lateral sclerosis: Secondary | ICD-10-CM | POA: Diagnosis not present

## 2020-03-23 DIAGNOSIS — Z87891 Personal history of nicotine dependence: Secondary | ICD-10-CM | POA: Diagnosis not present

## 2020-03-23 DIAGNOSIS — Y732 Prosthetic and other implants, materials and accessory gastroenterology and urology devices associated with adverse incidents: Secondary | ICD-10-CM | POA: Diagnosis not present

## 2020-03-23 NOTE — ED Triage Notes (Signed)
Pt arrives [facility] from home w cc of PEG tube dislodged. Pt aunt w pt repors 22 french with 69mL balloon. Pt has had peg tube since 2016, has had to be replaced multiple times.

## 2020-03-23 NOTE — ED Provider Notes (Signed)
[facility] Emergency Department Provider Note  ____________________________________________  Time seen: Approximately 9:04 PM  I have reviewed the triage vital signs and the nursing notes.   HISTORY  Chief Complaint Dislodged PEG tube  Level 5 Caveat: Portions of the History and Physical including HPI and review of systems are unable to be completely obtained due to patient with late stage ALS, ventilator dependent   HPI Randy Roach is a 44 y.o. male with a history of ALS and diabetes who was in his usual state of health until this evening at about 8:00 PM when his G-tube was inadvertently dislodged.  No fevers, been getting medications and tube feeds as usual, otherwise no acute complaints.      Past Medical History:  Diagnosis Date  . ALS (amyotrophic lateral sclerosis) ([place])   . Diabetes mellitus without complication ([place])   . Respiratory failure, chronic [facility])      Patient Active Problem List   Diagnosis Date Noted  . Diabetes mellitus, type 2 ([place]) 10/14/2019  . S/P percutaneous endoscopic gastrostomy (PEG) tube placement ([place]) 09/11/2019  . Hypertrophic granulation tissue 07/24/2019  . Medication management 04/19/2019  . Sputum culture positive for Pseudomonas 10/26/2017  . Skin irritation 03/18/2017  . Chronic neuromuscular respiratory failure ([place]) 02/23/2017  . Drug-induced constipation 01/15/2017  . Left lower lobe pneumonia 12/08/2016  . Hyperglycemia 12/08/2016  . Anemia 12/05/2016  . Anxiety and depression 12/05/2016  . Sepsis due to pneumonia ([place]) 05/08/2016  . Malnutrition compromising bodily function ([place]) 03/09/2016  . 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 Pain 03/09/2016  . 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 Oropharyngeal dysphagia 08/20/2015     Past Surgical History:  Procedure Laterality Date  . APPENDECTOMY    . fractured hands    . PEG PLACEMENT N/A 04/19/2017   Procedure: PERCUTANEOUS ENDOSCOPIC  GASTROSTOMY (PEG) REPLACEMENT;  Surgeon: Jonathon Bellows, MD;  Location: [facility] ENDOSCOPY;  Service: Endoscopy;  Laterality: N/A;  . PEG PLACEMENT N/A 04/19/2017   Procedure: PERCUTANEOUS ENDOSCOPIC GASTROSTOMY (PEG) PLACEMENT;  Surgeon: Jonathon Bellows, MD;  Location[facility] ENDOSCOPY;  Service: Gastroenterology;  Laterality: N/A;  . PEG TUBE PLACEMENT Left   . TRACHEOSTOMY       Prior to Admission medications   Medication Sig Start Date End Date Taking? Authorizing Provider  acetaminophen (TYLENOL) 325 MG tablet Take 2 tablets (650 mg total) by mouth every 6 (six) hours as needed for mild pain (or Fever >/= 101). 05/11/16   Flora Lipps, MD  baclofen (LIORESAL) 10 MG tablet Place 10 mg into feeding tube every 8 (eight) hours as needed for muscle spasms.    [provider]  bisacodyl (DULCOLAX) 10 MG suppository Place 10 mg rectally as needed for moderate constipation.    [provider]  busPIRone (BUSPAR) 10 MG tablet Place 10 mg into feeding tube 3 (three) times daily.    [provider]  chlorhexidine gluconate, SAGE KIT, (PERIDEX) 0.12 % solution 15 mLs by Mouth Rinse route 2 (two) times daily. 05/11/16   Flora Lipps, MD  diazepam (VALIUM) 5 MG tablet Take 5 mg by mouth every 12 (twelve) hours as needed for anxiety.    [provider]  famotidine (PEPCID) 20 MG tablet Take 20 mg by mouth 2 (two) times daily. 04/18/19 04/17/20  [provider]  insulin NPH Human (NOVOLIN N) 100 UNIT/ML injection Inject 28 Units into the skin 2 (two) times daily.  05/29/19   [provider]  Nutritional Supplements (FEEDING SUPPLEMENT,  OSMOLITE 1.5 CAL,) LIQD Place 237 mLs into feeding tube every 4 (four) hours. 12/12/16   Gladstone Lighter, MD  Oxycodone HCl 10 MG TABS Place 10 mg into feeding tube every 4 (four) hours as needed.    [provider]  Oxycodone HCl 20 MG TABS 25 mg by PEG Tube route every 6 (six) hours as needed.     [provider]   polyethylene glycol (MIRALAX / GLYCOLAX) packet Place 17 g into feeding tube daily.    [provider]  scopolamine (TRANSDERM-SCOP) 1 MG/3DAYS Place 1 patch (1.5 mg total) onto the skin every 3 (three) days. 12/14/16   Gladstone Lighter, MD  silver nitrate applicators 76-19 % applicator Two to three sticks every two-three days as needed for granulation tissue. 07/31/19   Fredirick Maudlin, MD  Skin Protectants, Misc. (EUCERIN) cream Apply 1 application topically 2 (two) times daily.    [provider]  sodium phosphate (FLEET) 7-19 GM/118ML ENEM Place 133 mLs (1 enema total) rectally once as needed for severe constipation. 12/12/16   Gladstone Lighter, MD  tamsulosin (FLOMAX) 0.4 MG CAPS capsule Take 0.4 mg by mouth as needed.    [provider]  tiZANidine (ZANAFLEX) 4 MG tablet Place 4 mg into feeding tube every 8 (eight) hours as needed for muscle spasms.    [provider]  venlafaxine (EFFEXOR) 75 MG tablet Take 75 mg by mouth every morning.    [provider]  venlafaxine XR (EFFEXOR-XR) 150 MG 24 hr capsule Take 150 mg by mouth every evening.    [provider]  Water For Irrigation, Sterile (FREE WATER) SOLN Place 200 mLs into feeding tube every 8 (eight) hours. 12/12/16   Gladstone Lighter, MD  zolpidem (AMBIEN) 5 MG tablet Take 5 mg by mouth at bedtime as needed for sleep.    [provider]     Allergies Debrox [carbamide peroxide] and Latex   Family History  Problem Relation Age of Onset  . Heart disease Mother   . ALS Father     Social History Social History   Tobacco Use  . Smoking status: Former Smoker    Packs/day: 1.00    Types: Cigarettes    Quit date: 10/14/2015    Years since quitting: 4.4  . Smokeless tobacco: Never Used  Vaping Use  . Vaping Use: Never used  Substance Use Topics  . Alcohol use: No    Comment: occ  . Drug use: No    Review of Systems  Constitutional:   No fever or chills.   ENT:   No sore throat. No rhinorrhea. Cardiovascular:   No chest pain or syncope. Respiratory:   No dyspnea or cough. Gastrointestinal:   Negative for abdominal pain, vomiting and diarrhea.  Musculoskeletal:   Negative for focal pain or swelling All other systems reviewed and are negative except as documented above in ROS and HPI.  ____________________________________________   PHYSICAL EXAM:  VITAL SIGNS: ED Triage Vitals [03/23/20 2054]  Enc Vitals Group     BP (!) 143/99     Pulse Rate (!) 117     Resp 20     Temp 97.9 F (36.6 C)     Temp Source Axillary     SpO2 100 %     Weight 186 lb 8.2 oz (84.6 kg)     Height 5' 6"  (1.676 m)     Head Circumference      Peak Flow      Pain  Score      Pain Loc      Pain Edu?      Excl. in [place]?     Vital signs reviewed, nursing assessments reviewed.   Constitutional:   Awake and alert. Non-toxic appearance. Eyes:   Conjunctivae are normal. EOMI.  ENT      Head:   Normocephalic and atraumatic.      Hematological/Lymphatic/Immunilogical:   No cervical lymphadenopathy. Cardiovascular:   RRR. Symmetric bilateral radial and DP pulses.  No murmurs. Cap refill less than 2 seconds. Respiratory:   Normal respiratory effort without tachypnea/retractions. Breath sounds are clear and equal bilaterally. No wheezes/rales/rhonchi. Gastrointestinal:   Soft and nontender.  Mildly distended. There is no CVA tenderness.  No rebound, rigidity, or guarding.  PEG site with tube feeding gastric contents draining spontaneously through ostomy.  Ostomy tract appears to be dilated. Musculoskeletal:   No wounds or swollen joints.  No focal tenderness Neurologic:   Ventilator dependent.  No acute focal neurologic deficits are appreciated.  Skin:    Skin is warm, dry and intact. No rash noted.  No petechiae, purpura, or bullae.  ____________________________________________    LABS (pertinent positives/negatives) (all labs ordered are listed, but only  abnormal results are displayed) Labs Reviewed - No data to display ____________________________________________   EKG    ____________________________________________    RADIOLOGY  No results found.  ____________________________________________   PROCEDURES Gastrostomy tube replacement  Date/Time: 03/23/2020 9:08 PM Performed by: Carrie Mew, MD Authorized by: Carrie Mew, MD  Preparation: Patient was prepped and draped in the usual sterile fashion. Local anesthesia used: no  Anesthesia: Local anesthesia used: no  Sedation: Patient sedated: no  Patient tolerance: patient tolerated the procedure well with no immediate complications Comments: Balloon inflated with 47m sterile water, secured with bumper, padded and dressed with dry gauze.     ____________________________________________    CLINICAL IMPRESSION / ASSESSMENT AND PLAN / ED COURSE  Medications ordered in the ED: Medications - No data to display  Pertinent labs & imaging results that were available during my care of the patient were reviewed by me and considered in my medical decision making (see chart for details).  Randy GOTTLIEBwas evaluated in Emergency Department on 03/23/2020 for the symptoms described in the history of present illness. He was evaluated in the context of the global COVID-19 pandemic, which necessitated consideration that the patient might be at risk for infection with the SARS-CoV-2 virus that causes COVID-19. Institutional protocols and algorithms that pertain to the evaluation of patients at risk for COVID-19 are in a state of rapid change based on information released by regulatory bodies including the CDC and federal and state organizations. These policies and algorithms were followed during the patient's care in the ED.   Patient presents with dislodged PEG tube which was replaced on arrival to the ED with 22 French tube.  Otherwise in baseline state of health,  stable for discharge      ____________________________________________   FINAL CLINICAL IMPRESSION(S) / ED DIAGNOSES    Final diagnoses:  Dislodged gastrostomy tube  Paraplegia ([place]  Type 2 diabetes mellitus without complication, with long-term current use of insulin ([facility]     ED Discharge Orders    None      Portions of this note were generated with dragon dictation software. Dictation errors may occur despite best attempts at proofreading.   SCarrie Mew MD 03/23/20 2109

## 2020-04-09 ENCOUNTER — Other Ambulatory Visit: Payer: Self-pay

## 2020-04-09 ENCOUNTER — Non-Acute Institutional Stay: Payer: Medicare Other | Admitting: Adult Health Nurse Practitioner

## 2020-04-09 DIAGNOSIS — G1221 Amyotrophic lateral sclerosis: Secondary | ICD-10-CM

## 2020-04-09 DIAGNOSIS — Z515 Encounter for palliative care: Secondary | ICD-10-CM

## 2020-04-10 NOTE — Progress Notes (Signed)
[facility] Consult Note Telephone: [phone]  Fax: [phone]  PATIENT NAME: Randy Roach DOB: 1976/01/08 MRN: [id number]  PRIMARY CARE PROVIDER:   Montel Clock, MD  REFERRING PROVIDER:  Montel Clock, MD [id number] [facility] [place],  [place] [address]  RESPONSIBLE PARTY:   Vanetta Mulders, brother June Taylor, aunt/caregiver H: [phone] C: [phone]         RECOMMENDATIONS and PLAN:  1.  Advanced care planning.  Patient is a DNR. Patient has MOST completed indicating DNR, limited interventions, antibiotics and IV fluids as indicated  2.  ALS. Patient is bedbound requiring total care. He is nonverbal. He is able to answer yes/no questions with his eyes. He will blink for yes and not blink for no. He receives all nutrition and meds through PEG tube. He has trach with ventilator at 4L.   He has suprapubic catheter in place for urinary retention.  Patient was in hospital 6/12 through 03/18/2020 for sepsis due to UTI.  Was in ER on 03/23/2020 to have PEG tube replaced due to dislodgment.  His aunt/caregiver states that he has been doing well since back home.  Is concerned that there is some sediment in the tubing for his suprapubic catheter.  Denies fever.  Has clear yellow urine noted in catheter bag no suprapubic tenderness noted.  Continue supportive care at home  3.  Support.  On states that she has been getting more consistent care from [place].  States that she feels like she might be able to go back to work part-time.  We further discussed when to take him off of the ventilator and PEG tube feedings.  She continues to endorse that she will keep him on his continued life support until he either indicates he wants to stop or major event such as a heart attack or stroke occurs.  We will continue to offer her support and is encouraged to call with any questions or concerns  No reported fevers,  N/V/D, constipation. Palliative will continue to monitor for symptom management/decline and make recommendations as needed. have next appointment in 8 weeks. Aunt encouraged to call with any concerns  I spent 50 minutes providing this consultation,  from 1:00 to 1:50 including time with patient/family, chart review, provider coordination, and documentation . More than 50% of the time in this consultation was spent coordinating communication.   HISTORY OF PRESENT ILLNESS:  Randy Roach is a 44 y.o. year old male with multiple medical problems including ALS, DMT2, anxiety, depression, chronic neuromuscular respiratory failure, urinary retention. Palliative Care was asked to help address goals of care.   CODE STATUS: DNR  PPS: 20% HOSPICE ELIGIBILITY/DIAGNOSIS: TBD  PHYSICAL EXAM:  HR  91  O2 99% on ventilator on 4L General: NAD, frail appearing Cardiovascular: regular rate and rhythm Pulmonary:lung sounds clear GU:  Suprapubic in place with clear yellow urine in bag; some sediment noted in tubing Extremities:traceedemato bilateral feet, no joint deformities Skin: no rashes on exposed skin Neurological:bedbound; unable to speak; can use eyes to answer yes/no questions  PAST MEDICAL HISTORY:  Past Medical History:  Diagnosis Date  . ALS (amyotrophic lateral sclerosis) ([place])   . Diabetes mellitus without complication ([place])   . Respiratory failure, chronic ([place])     SOCIAL HX:  Social History   Tobacco Use  . Smoking status: Former Smoker    Packs/day: 1.00    Types: Cigarettes    Quit  date: 10/14/2015    Years since quitting: 4.4  . Smokeless tobacco: Never Used  Substance Use Topics  . Alcohol use: No    Comment: occ    ALLERGIES:  Allergies  Allergen Reactions  . Debrox [Carbamide Peroxide]   . Latex      PERTINENT MEDICATIONS:  Outpatient Encounter Medications as of 04/09/2020  Medication Sig  . acetaminophen (TYLENOL) 325 MG tablet Take 2 tablets (650 mg  total) by mouth every 6 (six) hours as needed for mild pain (or Fever >/= 101).  . baclofen (LIORESAL) 10 MG tablet Place 10 mg into feeding tube every 8 (eight) hours as needed for muscle spasms.  . bisacodyl (DULCOLAX) 10 MG suppository Place 10 mg rectally as needed for moderate constipation.  . busPIRone (BUSPAR) 10 MG tablet Place 10 mg into feeding tube 3 (three) times daily.  . chlorhexidine gluconate, SAGE KIT, (PERIDEX) 0.12 % solution 15 mLs by Mouth Rinse route 2 (two) times daily.  . diazepam (VALIUM) 5 MG tablet Take 5 mg by mouth every 12 (twelve) hours as needed for anxiety.  . famotidine (PEPCID) 20 MG tablet Take 20 mg by mouth 2 (two) times daily.  . insulin NPH Human (NOVOLIN N) 100 UNIT/ML injection Inject 28 Units into the skin 2 (two) times daily.   . Nutritional Supplements (FEEDING SUPPLEMENT, OSMOLITE 1.5 CAL,) LIQD Place 237 mLs into feeding tube every 4 (four) hours.  . Oxycodone HCl 10 MG TABS Place 10 mg into feeding tube every 4 (four) hours as needed.  . Oxycodone HCl 20 MG TABS 25 mg by PEG Tube route every 6 (six) hours as needed.   . polyethylene glycol (MIRALAX / GLYCOLAX) packet Place 17 g into feeding tube daily.  Marland Kitchen scopolamine (TRANSDERM-SCOP) 1 MG/3DAYS Place 1 patch (1.5 mg total) onto the skin every 3 (three) days.  . silver nitrate applicators 49-67 % applicator Two to three sticks every two-three days as needed for granulation tissue.  . Skin Protectants, Misc. (EUCERIN) cream Apply 1 application topically 2 (two) times daily.  . sodium phosphate (FLEET) 7-19 GM/118ML ENEM Place 133 mLs (1 enema total) rectally once as needed for severe constipation.  . tamsulosin (FLOMAX) 0.4 MG CAPS capsule Take 0.4 mg by mouth as needed.  Marland Kitchen tiZANidine (ZANAFLEX) 4 MG tablet Place 4 mg into feeding tube every 8 (eight) hours as needed for muscle spasms.  Marland Kitchen venlafaxine (EFFEXOR) 75 MG tablet Take 75 mg by mouth every morning.  . venlafaxine XR (EFFEXOR-XR) 150 MG 24 hr  capsule Take 150 mg by mouth every evening.  . Water For Irrigation, Sterile (FREE WATER) SOLN Place 200 mLs into feeding tube every 8 (eight) hours.  Marland Kitchen zolpidem (AMBIEN) 5 MG tablet Take 5 mg by mouth at bedtime as needed for sleep.   No facility-administered encounter medications on file as of 04/09/2020.      Deaja Rizo Jenetta Downer, NP

## 2020-06-03 ENCOUNTER — Other Ambulatory Visit: Payer: Self-pay

## 2020-06-03 ENCOUNTER — Other Ambulatory Visit: Payer: Medicare Other | Admitting: Adult Health Nurse Practitioner

## 2020-06-03 DIAGNOSIS — Z515 Encounter for palliative care: Secondary | ICD-10-CM

## 2020-06-03 DIAGNOSIS — G1221 Amyotrophic lateral sclerosis: Secondary | ICD-10-CM

## 2020-06-03 NOTE — Progress Notes (Signed)
[facility] Consult Note Telephone: [phone]  Fax: [phone]  PATIENT NAME: Randy Roach DOB: 1976-08-22 MRN: [id number]  PRIMARY CARE PROVIDER:   Montel Clock, MD  REFERRING PROVIDER:  Montel Clock, MD [id number] [facility] [place],  [place] [address]  RESPONSIBLE PARTY:   Randy Roach, brother Randy Roach, aunt/caregiver H: [phone] C: [phone]     RECOMMENDATIONS and PLAN:  1.  Advanced care planning.Patient is a DNR. Patient has MOST completed indicating DNR, limited interventions, antibiotics and IV fluids as indicated  2.  ALS. Patient is bedbound requiring total care. He is nonverbal. He is able to answer yes/no questions with his eyes. He will blink for yes and not blink for no. He receives all nutrition and meds through PEG tube. He has trach with ventilator at 4L.  He has suprapubic catheter in place for urinary retention.  Has orders as of 05/25/20 for suprapubic catheter changes to be done at home by [place]. Denies fever.  Has clear yellow urine noted in catheter bag no suprapubic tenderness noted.  Continue supportive care at home   Patient has not had any infection or hospital visits since last visit.  No reported fevers, N/V/D, constipation. Palliative will continue to monitor for symptom management/decline and make recommendations as needed.have next appointment in 8 weeks.Aunt encouraged to call with any concerns  I spent 50 minutes providing this consultation,  from 11:00 to 11:50 including time with patient/family, chart review, provider coordination, and documentation . More than 50% of the time in this consultation was spent coordinating communication.   HISTORY OF PRESENT ILLNESS:  Randy Roach is a 44 y.o. year old male with multiple medical problems including ALS, DMT2, anxiety, depression, chronic neuromuscular respiratory failure, urinary  retention. Palliative Care was asked to help address goals of care.   CODE STATUS: DNR  PPS: 20% HOSPICE ELIGIBILITY/DIAGNOSIS: TBD  PHYSICAL EXAM: HR 91 O2 99% on ventilator on 4L General: NAD, frail appearing Cardiovascular: regular rate and rhythm Pulmonary:lung sounds clear GU: Suprapubic in place with clear yellow urine in bag; some sediment noted in tubing Extremities:traceedemato bilateral feet, no joint deformities Skin: no rasheson exposed skin Neurological:bedbound; unable to speak; can use eyes to answer yes/no questions  PAST MEDICAL HISTORY:  Past Medical History:  Diagnosis Date   ALS (amyotrophic lateral sclerosis) ([place])    Diabetes mellitus without complication ([place])    Respiratory failure, chronic ([place])     SOCIAL HX:  Social History   Tobacco Use   Smoking status: Former Smoker    Packs/day: 1.00    Types: Cigarettes    Quit date: 10/14/2015    Years since quitting: 4.6   Smokeless tobacco: Never Used  Substance Use Topics   Alcohol use: No    Comment: occ    ALLERGIES:  Allergies  Allergen Reactions   Debrox [Carbamide Peroxide]    Latex      PERTINENT MEDICATIONS:  Outpatient Encounter Medications as of 06/03/2020  Medication Sig   acetaminophen (TYLENOL) 325 MG tablet Take 2 tablets (650 mg total) by mouth every 6 (six) hours as needed for mild pain (or Fever >/= 101).   baclofen (LIORESAL) 10 MG tablet Place 10 mg into feeding tube every 8 (eight) hours as needed for muscle spasms.   bisacodyl (DULCOLAX) 10 MG suppository Place 10 mg rectally as needed for moderate constipation.   busPIRone (BUSPAR) 10 MG tablet Place 10  mg into feeding tube 3 (three) times daily.   chlorhexidine gluconate, SAGE KIT, (PERIDEX) 0.12 % solution 15 mLs by Mouth Rinse route 2 (two) times daily.   diazepam (VALIUM) 5 MG tablet Take 5 mg by mouth every 12 (twelve) hours as needed for anxiety.   insulin NPH Human (NOVOLIN N) 100 UNIT/ML  injection Inject 28 Units into the skin 2 (two) times daily.    Nutritional Supplements (FEEDING SUPPLEMENT, OSMOLITE 1.5 CAL,) LIQD Place 237 mLs into feeding tube every 4 (four) hours.   Oxycodone HCl 10 MG TABS Place 10 mg into feeding tube every 4 (four) hours as needed.   Oxycodone HCl 20 MG TABS 25 mg by PEG Tube route every 6 (six) hours as needed.    polyethylene glycol (MIRALAX / GLYCOLAX) packet Place 17 g into feeding tube daily.   scopolamine (TRANSDERM-SCOP) 1 MG/3DAYS Place 1 patch (1.5 mg total) onto the skin every 3 (three) days.   silver nitrate applicators 52-59 % applicator Two to three sticks every two-three days as needed for granulation tissue.   Skin Protectants, Misc. (EUCERIN) cream Apply 1 application topically 2 (two) times daily.   sodium phosphate (FLEET) 7-19 GM/118ML ENEM Place 133 mLs (1 enema total) rectally once as needed for severe constipation.   tamsulosin (FLOMAX) 0.4 MG CAPS capsule Take 0.4 mg by mouth as needed.   tiZANidine (ZANAFLEX) 4 MG tablet Place 4 mg into feeding tube every 8 (eight) hours as needed for muscle spasms.   venlafaxine (EFFEXOR) 75 MG tablet Take 75 mg by mouth every morning.   venlafaxine XR (EFFEXOR-XR) 150 MG 24 hr capsule Take 150 mg by mouth every evening.   Water For Irrigation, Sterile (FREE WATER) SOLN Place 200 mLs into feeding tube every 8 (eight) hours.   zolpidem (AMBIEN) 5 MG tablet Take 5 mg by mouth at bedtime as needed for sleep.   No facility-administered encounter medications on file as of 06/03/2020.     Susana Gripp Jenetta Downer, NP

## 2020-07-29 ENCOUNTER — Other Ambulatory Visit: Payer: Self-pay

## 2020-07-29 ENCOUNTER — Other Ambulatory Visit: Payer: Medicare Other | Admitting: Adult Health Nurse Practitioner

## 2020-07-29 DIAGNOSIS — Z515 Encounter for palliative care: Secondary | ICD-10-CM

## 2020-07-29 DIAGNOSIS — G1221 Amyotrophic lateral sclerosis: Secondary | ICD-10-CM

## 2020-07-29 NOTE — Progress Notes (Signed)
[facility] Consult Note Telephone: [phone]  Fax: [phone]  PATIENT NAME: Randy Roach DOB: Oct 27, 1975 MRN: [id number]  PRIMARY CARE PROVIDER:   Montel Clock, MD  REFERRING PROVIDER:  Montel Clock, MD [address] [place],  [place] [address]  RESPONSIBLE PARTY:   Vanetta Mulders, brother June Taylor, aunt/caregiver H: [phone] C: [phone]   RECOMMENDATIONS and PLAN: 1.Advanced care planning.Patient is a DNR. Patient has MOST completed indicating DNR, limited interventions, antibiotics and IV fluids as indicated  2.  ALS. Patient is bedbound requiring total care. He is nonverbal. He is able to answer yes/no questions with his eyes. He will blink for yes and not blink for no. He receives all nutrition and meds through PEG tube. He has trach with ventilator at 4L.He has suprapubic catheter in place for urinary retention.Denies fever. Has clear yellow urine noted in catheter bag no suprapubic tenderness noted. Continue supportive care at home   3.  Depression.  Patient is nonverbal but is able to communicate with family.  Aunt states that he has been having increased depression.  He is currently on buspar 10 mg TID and venlafaxine 75 mg in the AM and 150 mg at night.  E is at maximum dose of venlafaxine and recommend increasing the buspar. Will reach out to PCP with this recommendation.  Patient has not had any infection or hospital visits since last visit.  No reported fevers, N/V/D, constipation. Palliative will continue to monitor for symptom management/decline and make recommendations as needed.have next appointment in6weeks.Aunt encouraged to call with any concerns  I spent 30 minutes providing this consultation,  from 10:00 to 10:30 including time with patient/family, chart review, provider coordination, and documentation. More than 50% of the time in this  consultation was spent coordinating communication.   HISTORY OF PRESENT ILLNESS:  Randy Roach is a 44 y.o. year old male with multiple medical problems including ALS, DMT2, anxiety, depression, chronic neuromuscular respiratory failure, urinary retention. Palliative Care was asked to help address goals of care.   CODE STATUS: DNR  PPS: 20% HOSPICE ELIGIBILITY/DIAGNOSIS: TBD  PHYSICAL EXAM: [id number] 96% on ventilator on 4L General: NAD, frail appearing Cardiovascular: regular rate and rhythm Pulmonary:lung sounds clear GU: Suprapubic in place with clear yellow urine in bag Extremities:no edema, no joint deformities Skin: no rasheson exposed skin Neurological:bedbound; unable to speak; can use eyes to answer yes/no questions  PAST MEDICAL HISTORY:  Past Medical History:  Diagnosis Date  . ALS (amyotrophic lateral sclerosis) ([facility])   . Diabetes mellitus without complication ([place])   . Respiratory failure, chronic ([place])     SOCIAL HX:  Social History   Tobacco Use  . Smoking status: Former Smoker    Packs/day: 1.00    Types: Cigarettes    Quit date: 10/14/2015    Years since quitting: 4.7  . Smokeless tobacco: Never Used  Substance Use Topics  . Alcohol use: No    Comment: occ    ALLERGIES:  Allergies  Allergen Reactions  . Debrox [Carbamide Peroxide]   . Latex      PERTINENT MEDICATIONS:  Outpatient Encounter Medications as of 07/29/2020  Medication Sig  . acetaminophen (TYLENOL) 325 MG tablet Take 2 tablets (650 mg total) by mouth every 6 (six) hours as needed for mild pain (or Fever >/= 101).  . baclofen (LIORESAL) 10 MG tablet Place 10 mg into feeding tube every 8 (eight) hours as needed for muscle  spasms.  . bisacodyl (DULCOLAX) 10 MG suppository Place 10 mg rectally as needed for moderate constipation.  . busPIRone (BUSPAR) 10 MG tablet Place 10 mg into feeding tube 3 (three) times daily.  . chlorhexidine gluconate, SAGE KIT, (PERIDEX) 0.12 %  solution 15 mLs by Mouth Rinse route 2 (two) times daily.  . diazepam (VALIUM) 5 MG tablet Take 5 mg by mouth every 12 (twelve) hours as needed for anxiety.  . insulin NPH Human (NOVOLIN N) 100 UNIT/ML injection Inject 28 Units into the skin 2 (two) times daily.   . Nutritional Supplements (FEEDING SUPPLEMENT, OSMOLITE 1.5 CAL,) LIQD Place 237 mLs into feeding tube every 4 (four) hours.  . Oxycodone HCl 10 MG TABS Place 10 mg into feeding tube every 4 (four) hours as needed.  . Oxycodone HCl 20 MG TABS 25 mg by PEG Tube route every 6 (six) hours as needed.   . polyethylene glycol (MIRALAX / GLYCOLAX) packet Place 17 g into feeding tube daily.  Marland Kitchen scopolamine (TRANSDERM-SCOP) 1 MG/3DAYS Place 1 patch (1.5 mg total) onto the skin every 3 (three) days.  . silver nitrate applicators 16-10 % applicator Two to three sticks every two-three days as needed for granulation tissue.  . Skin Protectants, Misc. (EUCERIN) cream Apply 1 application topically 2 (two) times daily.  . sodium phosphate (FLEET) 7-19 GM/118ML ENEM Place 133 mLs (1 enema total) rectally once as needed for severe constipation.  . tamsulosin (FLOMAX) 0.4 MG CAPS capsule Take 0.4 mg by mouth as needed.  Marland Kitchen tiZANidine (ZANAFLEX) 4 MG tablet Place 4 mg into feeding tube every 8 (eight) hours as needed for muscle spasms.  Marland Kitchen venlafaxine (EFFEXOR) 75 MG tablet Take 75 mg by mouth every morning.  . venlafaxine XR (EFFEXOR-XR) 150 MG 24 hr capsule Take 150 mg by mouth every evening.  . Water For Irrigation, Sterile (FREE WATER) SOLN Place 200 mLs into feeding tube every 8 (eight) hours.  Marland Kitchen zolpidem (AMBIEN) 5 MG tablet Take 5 mg by mouth at bedtime as needed for sleep.   No facility-administered encounter medications on file as of 07/29/2020.       Elisa Sorlie Jenetta Downer, NP

## 2020-07-30 ENCOUNTER — Telehealth: Payer: Self-pay

## 2020-07-30 NOTE — Telephone Encounter (Signed)
At request of Angelique Holm NP, update including recommendations to increase Buspar due to worsening depression sent to PCP.

## 2020-08-12 ENCOUNTER — Telehealth: Payer: Self-pay | Admitting: Adult Health Nurse Practitioner

## 2020-08-12 NOTE — Telephone Encounter (Signed)
Returning aunt's VM that patient will be going to hospital for fevers and increased HR that she felt may be due to an infection.  Left VM with reason for call and call back info Araya Roel K. Garner Nash NP

## 2020-08-12 NOTE — Telephone Encounter (Signed)
Spoke with aunt and scheduled visit for 08/25/20 at 11am.  Encouraged to call with any concerns. Marialena Wollen K. Garner Nash NP

## 2020-08-25 ENCOUNTER — Other Ambulatory Visit: Payer: Self-pay

## 2020-08-25 ENCOUNTER — Other Ambulatory Visit: Payer: Medicare Other | Admitting: Adult Health Nurse Practitioner

## 2020-08-25 DIAGNOSIS — Z515 Encounter for palliative care: Secondary | ICD-10-CM

## 2020-08-25 DIAGNOSIS — G1221 Amyotrophic lateral sclerosis: Secondary | ICD-10-CM

## 2020-08-25 DIAGNOSIS — F339 Major depressive disorder, recurrent, unspecified: Secondary | ICD-10-CM

## 2020-08-25 NOTE — Progress Notes (Signed)
[facility] Consult Note Telephone: [phone]  Fax: [phone]  PATIENT NAME: Randy Roach DOB: 12-09-1975 MRN: [id number]  PRIMARY CARE PROVIDER:   Montel Clock, MD  REFERRING PROVIDER:  Montel Clock, MD [address] [place],  [place] [address]  RESPONSIBLE PARTY:   Vanetta Mulders, brother June Taylor, aunt/caregiver H: [phone] C: [phone]   RECOMMENDATIONS and PLAN: 1.Advanced care planning.Patient is a DNR. Patient has MOST completed indicating DNR, limited interventions, antibiotics and IV fluids as indicated  2.  ALS. Patient is bedbound requiring total care. He is nonverbal. He is able to answer yes/no questions with his eyes. He will blink for yes and not blink for no. He receives all nutrition and meds through PEG tube. He has trach with ventilator at 4L.He has suprapubic catheter in place for urinary retention.  See below for recent infection.  Continue supportive care at home  3.  Depression.  Improved with recent increase in venlafaxine.  Continue current dose and continue monitoring for depression that may warrant further titrating of venlafaxine  Palliative will continue to monitor for symptom management/decline and make recommendations as needed.have next appointment in8weeks.Aunt encouraged to call with any concerns  I spent 40 minutes providing this consultation. More than 50% of the time in this consultation was spent coordinating communication.   HISTORY OF PRESENT ILLNESS:  Randy Roach is a 44 y.o. year old male with multiple medical problems including ALS, DMT2, anxiety, depression, chronic neuromuscular respiratory failure, urinary retention. Palliative Care was asked to help address goals of care. Patient just finished antibiotic treatment for UTI.  Aunt states that he was having fever with sweats.  She was having to change his gown 3-4 times a  day.  States that he still gets sweaty at times but not as bad as what it was.  His urine is no longer dark and is clear yellow today.  Patient was having increased blood sugars and NPH insulin was increased to 65 units BID.  Aunt states that this is improving.  His venlafaxine was increased to 150 mg BID due to increased depression and aunt states that this is getting better.  Pain was increased during infection but is improving after antibiotic therapy.  Aunt was able to keep him out of the hospital but does have concerns that it takes a long time to get a urine sample and to have it tested as compared to taking him to the hospital.  Discussed that she does know that if his heart rate continues to increase and stay increased and if fever persists that he may need to go to the hospital.  She is able to communicate with him to get his approval to go to hospital or to stay at home.  CODE STATUS: DNR  PPS: 20% HOSPICE ELIGIBILITY/DIAGNOSIS: TBD  PHYSICAL EXAM: [id number] 99% on ventilator on 4L General: NAD, frail appearing Cardiovascular: regular rate and rhythm Pulmonary:lung sounds clear GU: Suprapubic in place with clear yellow urine in bag Extremities:no edema, no joint deformities Skin: no rasheson exposed skin; skin clammy Neurological:bedbound; unable to speak; can use eyes to answer yes/no questions  PAST MEDICAL HISTORY:  Past Medical History:  Diagnosis Date  . ALS (amyotrophic lateral sclerosis) ([facility])   . Diabetes mellitus without complication ([facility])   . Respiratory failure, chronic ([facility])     SOCIAL HX:  Social History   Tobacco Use  . Smoking status: Former Smoker  Packs/day: 1.00    Types: Cigarettes    Quit date: 10/14/2015    Years since quitting: 4.8  . Smokeless tobacco: Never Used  Substance Use Topics  . Alcohol use: No    Comment: occ    ALLERGIES:  Allergies  Allergen Reactions  . Debrox [Carbamide Peroxide]   . Latex      PERTINENT MEDICATIONS:   Outpatient Encounter Medications as of 08/25/2020  Medication Sig  . acetaminophen (TYLENOL) 325 MG tablet Take 2 tablets (650 mg total) by mouth every 6 (six) hours as needed for mild pain (or Fever >/= 101).  . baclofen (LIORESAL) 10 MG tablet Place 10 mg into feeding tube every 8 (eight) hours as needed for muscle spasms.  . bisacodyl (DULCOLAX) 10 MG suppository Place 10 mg rectally as needed for moderate constipation.  . busPIRone (BUSPAR) 10 MG tablet Place 10 mg into feeding tube 3 (three) times daily.  . chlorhexidine gluconate, SAGE KIT, (PERIDEX) 0.12 % solution 15 mLs by Mouth Rinse route 2 (two) times daily.  . diazepam (VALIUM) 5 MG tablet Take 5 mg by mouth every 12 (twelve) hours as needed for anxiety.  . insulin NPH Human (NOVOLIN N) 100 UNIT/ML injection Inject 28 Units into the skin 2 (two) times daily.   . Nutritional Supplements (FEEDING SUPPLEMENT, OSMOLITE 1.5 CAL,) LIQD Place 237 mLs into feeding tube every 4 (four) hours.  . Oxycodone HCl 10 MG TABS Place 10 mg into feeding tube every 4 (four) hours as needed.  . Oxycodone HCl 20 MG TABS 25 mg by PEG Tube route every 6 (six) hours as needed.   . polyethylene glycol (MIRALAX / GLYCOLAX) packet Place 17 g into feeding tube daily.  Marland Kitchen scopolamine (TRANSDERM-SCOP) 1 MG/3DAYS Place 1 patch (1.5 mg total) onto the skin every 3 (three) days.  . silver nitrate applicators 21-97 % applicator Two to three sticks every two-three days as needed for granulation tissue.  . Skin Protectants, Misc. (EUCERIN) cream Apply 1 application topically 2 (two) times daily.  . sodium phosphate (FLEET) 7-19 GM/118ML ENEM Place 133 mLs (1 enema total) rectally once as needed for severe constipation.  . tamsulosin (FLOMAX) 0.4 MG CAPS capsule Take 0.4 mg by mouth as needed.  Marland Kitchen tiZANidine (ZANAFLEX) 4 MG tablet Place 4 mg into feeding tube every 8 (eight) hours as needed for muscle spasms.  Marland Kitchen venlafaxine (EFFEXOR) 75 MG tablet Take 75 mg by mouth every  morning.  . venlafaxine XR (EFFEXOR-XR) 150 MG 24 hr capsule Take 150 mg by mouth every evening.  . Water For Irrigation, Sterile (FREE WATER) SOLN Place 200 mLs into feeding tube every 8 (eight) hours.  Marland Kitchen zolpidem (AMBIEN) 5 MG tablet Take 5 mg by mouth at bedtime as needed for sleep.   No facility-administered encounter medications on file as of 08/25/2020.     Goerge Mohr Jenetta Downer, NP

## 2020-09-01 ENCOUNTER — Emergency Department: Payer: Medicare Other

## 2020-09-01 ENCOUNTER — Inpatient Hospital Stay
Admission: EM | Admit: 2020-09-01 | Discharge: 2020-10-02 | DRG: 698 | Disposition: E | Payer: Medicare Other | Attending: Internal Medicine | Admitting: Internal Medicine

## 2020-09-01 ENCOUNTER — Inpatient Hospital Stay: Payer: Medicare Other

## 2020-09-01 ENCOUNTER — Other Ambulatory Visit: Payer: Self-pay

## 2020-09-01 DIAGNOSIS — J9611 Chronic respiratory failure with hypoxia: Secondary | ICD-10-CM | POA: Diagnosis not present

## 2020-09-01 DIAGNOSIS — Z9911 Dependence on respirator [ventilator] status: Secondary | ICD-10-CM | POA: Diagnosis not present

## 2020-09-01 DIAGNOSIS — J9610 Chronic respiratory failure, unspecified whether with hypoxia or hypercapnia: Secondary | ICD-10-CM | POA: Diagnosis not present

## 2020-09-01 DIAGNOSIS — Z7189 Other specified counseling: Secondary | ICD-10-CM | POA: Diagnosis not present

## 2020-09-01 DIAGNOSIS — Z930 Tracheostomy status: Secondary | ICD-10-CM | POA: Diagnosis not present

## 2020-09-01 DIAGNOSIS — A419 Sepsis, unspecified organism: Secondary | ICD-10-CM | POA: Diagnosis present

## 2020-09-01 DIAGNOSIS — Z515 Encounter for palliative care: Secondary | ICD-10-CM | POA: Diagnosis not present

## 2020-09-01 DIAGNOSIS — E1165 Type 2 diabetes mellitus with hyperglycemia: Secondary | ICD-10-CM | POA: Diagnosis present

## 2020-09-01 DIAGNOSIS — Z79899 Other long term (current) drug therapy: Secondary | ICD-10-CM

## 2020-09-01 DIAGNOSIS — Z931 Gastrostomy status: Secondary | ICD-10-CM

## 2020-09-01 DIAGNOSIS — Y846 Urinary catheterization as the cause of abnormal reaction of the patient, or of later complication, without mention of misadventure at the time of the procedure: Secondary | ICD-10-CM | POA: Diagnosis present

## 2020-09-01 DIAGNOSIS — N390 Urinary tract infection, site not specified: Secondary | ICD-10-CM | POA: Diagnosis present

## 2020-09-01 DIAGNOSIS — Z87891 Personal history of nicotine dependence: Secondary | ICD-10-CM | POA: Diagnosis not present

## 2020-09-01 DIAGNOSIS — Z20822 Contact with and (suspected) exposure to covid-19: Secondary | ICD-10-CM | POA: Diagnosis present

## 2020-09-01 DIAGNOSIS — G928 Other toxic encephalopathy: Secondary | ICD-10-CM | POA: Diagnosis present

## 2020-09-01 DIAGNOSIS — M7989 Other specified soft tissue disorders: Secondary | ICD-10-CM

## 2020-09-01 DIAGNOSIS — Z8249 Family history of ischemic heart disease and other diseases of the circulatory system: Secondary | ICD-10-CM

## 2020-09-01 DIAGNOSIS — I5021 Acute systolic (congestive) heart failure: Secondary | ICD-10-CM | POA: Diagnosis not present

## 2020-09-01 DIAGNOSIS — T83511A Infection and inflammatory reaction due to indwelling urethral catheter, initial encounter: Principal | ICD-10-CM | POA: Diagnosis present

## 2020-09-01 DIAGNOSIS — J9622 Acute and chronic respiratory failure with hypercapnia: Secondary | ICD-10-CM | POA: Diagnosis present

## 2020-09-01 DIAGNOSIS — G8250 Quadriplegia, unspecified: Secondary | ICD-10-CM

## 2020-09-01 DIAGNOSIS — Z66 Do not resuscitate: Secondary | ICD-10-CM | POA: Diagnosis not present

## 2020-09-01 DIAGNOSIS — N179 Acute kidney failure, unspecified: Secondary | ICD-10-CM | POA: Diagnosis not present

## 2020-09-01 DIAGNOSIS — E43 Unspecified severe protein-calorie malnutrition: Secondary | ICD-10-CM | POA: Diagnosis present

## 2020-09-01 DIAGNOSIS — J9621 Acute and chronic respiratory failure with hypoxia: Secondary | ICD-10-CM | POA: Diagnosis present

## 2020-09-01 DIAGNOSIS — Z6829 Body mass index (BMI) 29.0-29.9, adult: Secondary | ICD-10-CM

## 2020-09-01 DIAGNOSIS — E119 Type 2 diabetes mellitus without complications: Secondary | ICD-10-CM

## 2020-09-01 DIAGNOSIS — Z888 Allergy status to other drugs, medicaments and biological substances status: Secondary | ICD-10-CM

## 2020-09-01 DIAGNOSIS — E871 Hypo-osmolality and hyponatremia: Secondary | ICD-10-CM | POA: Diagnosis present

## 2020-09-01 DIAGNOSIS — Z7401 Bed confinement status: Secondary | ICD-10-CM | POA: Diagnosis not present

## 2020-09-01 DIAGNOSIS — R6521 Severe sepsis with septic shock: Secondary | ICD-10-CM | POA: Diagnosis present

## 2020-09-01 DIAGNOSIS — D649 Anemia, unspecified: Secondary | ICD-10-CM | POA: Diagnosis present

## 2020-09-01 DIAGNOSIS — G1221 Amyotrophic lateral sclerosis: Secondary | ICD-10-CM | POA: Diagnosis present

## 2020-09-01 DIAGNOSIS — R000 Tachycardia, unspecified: Secondary | ICD-10-CM | POA: Diagnosis present

## 2020-09-01 DIAGNOSIS — R140 Abdominal distension (gaseous): Secondary | ICD-10-CM

## 2020-09-01 DIAGNOSIS — R6520 Severe sepsis without septic shock: Secondary | ICD-10-CM | POA: Diagnosis not present

## 2020-09-01 DIAGNOSIS — Z794 Long term (current) use of insulin: Secondary | ICD-10-CM

## 2020-09-01 DIAGNOSIS — R231 Pallor: Secondary | ICD-10-CM

## 2020-09-01 DIAGNOSIS — Z91040 Latex allergy status: Secondary | ICD-10-CM

## 2020-09-01 LAB — CBG MONITORING, ED: Glucose-Capillary: 267 mg/dL — ABNORMAL HIGH (ref 70–99)

## 2020-09-01 LAB — COMPREHENSIVE METABOLIC PANEL
ALT: 46 U/L — ABNORMAL HIGH (ref 0–44)
AST: 41 U/L (ref 15–41)
Albumin: 3.6 g/dL (ref 3.5–5.0)
Alkaline Phosphatase: 112 U/L (ref 38–126)
Anion gap: 13 (ref 5–15)
BUN: 16 mg/dL (ref 6–20)
CO2: 22 mmol/L (ref 22–32)
Calcium: 9.6 mg/dL (ref 8.9–10.3)
Chloride: 97 mmol/L — ABNORMAL LOW (ref 98–111)
Creatinine, Ser: <0.3 mg/dL — ABNORMAL LOW (ref 0.61–1.24)
Glucose, Bld: 273 mg/dL — ABNORMAL HIGH (ref 70–99)
Potassium: 4.2 mmol/L (ref 3.5–5.1)
Sodium: 132 mmol/L — ABNORMAL LOW (ref 135–145)
Total Bilirubin: 0.8 mg/dL (ref 0.3–1.2)
Total Protein: 7.7 g/dL (ref 6.5–8.1)

## 2020-09-01 LAB — GLUCOSE, CAPILLARY: Glucose-Capillary: 381 mg/dL — ABNORMAL HIGH (ref 70–99)

## 2020-09-01 LAB — URINALYSIS, COMPLETE (UACMP) WITH MICROSCOPIC
Bilirubin Urine: NEGATIVE
Glucose, UA: >=500 mg/dL — AB
Ketones, ur: 20 mg/dL — AB
Nitrite: NEGATIVE
Protein, ur: 100 mg/dL — AB
RBC / HPF: >50 RBC/hpf — ABNORMAL HIGH (ref 0–5)
Specific Gravity, Urine: 1.026 (ref 1.005–1.030)
Squamous Epithelial / HPF: NONE SEEN (ref 0–5)
WBC, UA: >50 WBC/hpf — ABNORMAL HIGH (ref 0–5)
pH: 8 (ref 5.0–8.0)

## 2020-09-01 LAB — RESP PANEL BY RT-PCR (FLU A&B, COVID) ARPGX2
Influenza A by PCR: NEGATIVE
Influenza B by PCR: NEGATIVE
SARS Coronavirus 2 by RT PCR: NEGATIVE

## 2020-09-01 LAB — CBC WITH DIFFERENTIAL/PLATELET
Abs Immature Granulocytes: 0.06 10*3/uL (ref 0.00–0.07)
Basophils Absolute: 0.1 10*3/uL (ref 0.0–0.1)
Basophils Relative: 1 %
Eosinophils Absolute: 0.1 10*3/uL (ref 0.0–0.5)
Eosinophils Relative: 1 %
HCT: 39.7 % (ref 39.0–52.0)
Hemoglobin: 12.7 g/dL — ABNORMAL LOW (ref 13.0–17.0)
Immature Granulocytes: 1 %
Lymphocytes Relative: 18 %
Lymphs Abs: 1.3 10*3/uL (ref 0.7–4.0)
MCH: 28.7 pg (ref 26.0–34.0)
MCHC: 32 g/dL (ref 30.0–36.0)
MCV: 89.6 fL (ref 80.0–100.0)
Monocytes Absolute: 0.6 10*3/uL (ref 0.1–1.0)
Monocytes Relative: 8 %
Neutro Abs: 5.2 10*3/uL (ref 1.7–7.7)
Neutrophils Relative %: 71 %
Platelets: 313 10*3/uL (ref 150–400)
RBC: 4.43 MIL/uL (ref 4.22–5.81)
RDW: 15.8 % — ABNORMAL HIGH (ref 11.5–15.5)
WBC: 7.2 10*3/uL (ref 4.0–10.5)
nRBC: 0 % (ref 0.0–0.2)

## 2020-09-01 LAB — RESPIRATORY PANEL BY PCR

## 2020-09-01 LAB — MRSA PCR SCREENING: MRSA by PCR: NEGATIVE

## 2020-09-01 LAB — APTT: aPTT: 30 seconds (ref 24–36)

## 2020-09-01 LAB — HEMOGLOBIN A1C
Hgb A1c MFr Bld: 8.5 % — ABNORMAL HIGH (ref 4.8–5.6)
Mean Plasma Glucose: 197.25 mg/dL

## 2020-09-01 LAB — PROTIME-INR
INR: 0.9 (ref 0.8–1.2)
Prothrombin Time: 11.4 seconds (ref 11.4–15.2)

## 2020-09-01 LAB — LACTIC ACID, PLASMA: Lactic Acid, Venous: 1.6 mmol/L (ref 0.5–1.9)

## 2020-09-01 MED ORDER — FUROSEMIDE 10 MG/ML IJ SOLN
40.0000 mg | Freq: Two times a day (BID) | INTRAMUSCULAR | Status: DC
  Administered 2020-09-01: 40 mg via INTRAVENOUS
  Filled 2020-09-01: qty 4

## 2020-09-01 MED ORDER — HYDROCERIN EX CREA
1.0000 "application " | TOPICAL_CREAM | Freq: Two times a day (BID) | CUTANEOUS | Status: DC
  Administered 2020-09-02 – 2020-09-07 (×12): 1 via TOPICAL
  Filled 2020-09-01 (×2): qty 113

## 2020-09-01 MED ORDER — TIZANIDINE HCL 4 MG PO TABS
4.0000 mg | ORAL_TABLET | Freq: Three times a day (TID) | ORAL | Status: DC | PRN
  Administered 2020-09-04 – 2020-09-06 (×6): 4 mg
  Filled 2020-09-01 (×11): qty 1

## 2020-09-01 MED ORDER — VENLAFAXINE HCL ER 75 MG PO CP24
150.0000 mg | ORAL_CAPSULE | Freq: Every evening | ORAL | Status: DC
  Administered 2020-09-01 – 2020-09-06 (×6): 150 mg via ORAL
  Filled 2020-09-01 (×5): qty 2
  Filled 2020-09-01: qty 1
  Filled 2020-09-01: qty 2

## 2020-09-01 MED ORDER — FLEET ENEMA 7-19 GM/118ML RE ENEM: 1.0000 | ENEMA | Freq: Once | RECTAL | Status: DC | PRN

## 2020-09-01 MED ORDER — OSMOLITE 1.5 CAL PO LIQD
237.0000 mL | ORAL | Status: DC
  Administered 2020-09-01 – 2020-09-02 (×7): 237 mL

## 2020-09-01 MED ORDER — DIAZEPAM 5 MG PO TABS
5.0000 mg | ORAL_TABLET | Freq: Two times a day (BID) | ORAL | Status: DC | PRN
  Administered 2020-09-01 – 2020-09-06 (×8): 5 mg via ORAL
  Filled 2020-09-01 (×9): qty 1

## 2020-09-01 MED ORDER — IPRATROPIUM-ALBUTEROL 0.5-2.5 (3) MG/3ML IN SOLN
3.0000 mL | Freq: Four times a day (QID) | RESPIRATORY_TRACT | Status: DC | PRN
  Administered 2020-09-02: 3 mL via RESPIRATORY_TRACT
  Filled 2020-09-01: qty 3

## 2020-09-01 MED ORDER — OXYCODONE HCL 5 MG PO TABS
10.0000 mg | ORAL_TABLET | ORAL | Status: DC | PRN
  Administered 2020-09-01 – 2020-09-06 (×10): 10 mg
  Filled 2020-09-01 (×11): qty 2

## 2020-09-01 MED ORDER — SODIUM CHLORIDE 0.9 % IV SOLN
2.0000 g | INTRAVENOUS | Status: DC
  Administered 2020-09-01 – 2020-09-05 (×5): 2 g via INTRAVENOUS
  Filled 2020-09-01 (×3): qty 2
  Filled 2020-09-01: qty 20
  Filled 2020-09-01: qty 2

## 2020-09-01 MED ORDER — INSULIN DETEMIR 100 UNIT/ML ~~LOC~~ SOLN
65.0000 [IU] | Freq: Two times a day (BID) | SUBCUTANEOUS | Status: DC
  Administered 2020-09-01: 65 [IU] via SUBCUTANEOUS
  Filled 2020-09-01 (×2): qty 0.65

## 2020-09-01 MED ORDER — ENOXAPARIN SODIUM 40 MG/0.4ML ~~LOC~~ SOLN
40.0000 mg | SUBCUTANEOUS | Status: DC
  Administered 2020-09-01 – 2020-09-07 (×7): 40 mg via SUBCUTANEOUS
  Filled 2020-09-01 (×7): qty 0.4

## 2020-09-01 MED ORDER — SODIUM CHLORIDE 0.9 % IV BOLUS (SEPSIS)
1000.0000 mL | Freq: Once | INTRAVENOUS | Status: AC
  Administered 2020-09-01: 1000 mL via INTRAVENOUS

## 2020-09-01 MED ORDER — SCOPOLAMINE 1 MG/3DAYS TD PT72
1.0000 | MEDICATED_PATCH | TRANSDERMAL | Status: DC
  Administered 2020-09-04: 1.5 mg via TRANSDERMAL
  Filled 2020-09-01 (×2): qty 1

## 2020-09-01 MED ORDER — BUSPIRONE HCL 10 MG PO TABS
10.0000 mg | ORAL_TABLET | Freq: Three times a day (TID) | ORAL | Status: DC
  Administered 2020-09-01 – 2020-09-06 (×18): 10 mg
  Filled 2020-09-01 (×14): qty 1
  Filled 2020-09-01: qty 2
  Filled 2020-09-01 (×7): qty 1

## 2020-09-01 MED ORDER — ACETAMINOPHEN 325 MG PO TABS
650.0000 mg | ORAL_TABLET | Freq: Four times a day (QID) | ORAL | Status: DC | PRN
  Administered 2020-09-02 – 2020-09-04 (×4): 650 mg via ORAL
  Filled 2020-09-01 (×4): qty 2

## 2020-09-01 MED ORDER — SODIUM CHLORIDE 0.9 % IV SOLN: INTRAVENOUS | Status: DC

## 2020-09-01 MED ORDER — OXYCODONE HCL 5 MG PO TABS
25.0000 mg | ORAL_TABLET | Freq: Four times a day (QID) | ORAL | Status: DC | PRN
  Administered 2020-09-01 – 2020-09-07 (×14): 25 mg
  Filled 2020-09-01 (×15): qty 5

## 2020-09-01 MED ORDER — POLYETHYLENE GLYCOL 3350 17 G PO PACK
17.0000 g | PACK | Freq: Every day | ORAL | Status: DC
  Administered 2020-09-03 – 2020-09-06 (×4): 17 g
  Filled 2020-09-01 (×3): qty 1

## 2020-09-01 MED ORDER — INSULIN NPH (HUMAN) (ISOPHANE) 100 UNIT/ML ~~LOC~~ SUSP
65.0000 [IU] | Freq: Two times a day (BID) | SUBCUTANEOUS | Status: DC
  Filled 2020-09-01: qty 0.65
  Filled 2020-09-01 (×2): qty 10

## 2020-09-01 MED ORDER — TAMSULOSIN HCL 0.4 MG PO CAPS: 0.4000 mg | ORAL_CAPSULE | ORAL | Status: DC | PRN

## 2020-09-01 MED ORDER — ZOLPIDEM TARTRATE 5 MG PO TABS: 5.0000 mg | ORAL_TABLET | Freq: Every evening | ORAL | Status: DC | PRN

## 2020-09-01 MED ORDER — ONDANSETRON HCL 4 MG/2ML IJ SOLN: 4.0000 mg | Freq: Four times a day (QID) | INTRAMUSCULAR | Status: DC | PRN

## 2020-09-01 MED ORDER — CHLORHEXIDINE GLUCONATE CLOTH 2 % EX PADS
6.0000 | MEDICATED_PAD | Freq: Every day | CUTANEOUS | Status: DC
  Administered 2020-09-01 – 2020-09-06 (×6): 6 via TOPICAL

## 2020-09-01 MED ORDER — CHLORHEXIDINE GLUCONATE 0.12% ORAL RINSE (MEDLINE KIT)
15.0000 mL | Freq: Two times a day (BID) | OROMUCOSAL | Status: DC
  Administered 2020-09-01 – 2020-09-07 (×12): 15 mL via OROMUCOSAL
  Filled 2020-09-01 (×3): qty 15

## 2020-09-01 MED ORDER — SODIUM CHLORIDE 0.9 % IV SOLN
2.0000 g | Freq: Once | INTRAVENOUS | Status: AC
  Administered 2020-09-01: 2 g via INTRAVENOUS
  Filled 2020-09-01: qty 2

## 2020-09-01 MED ORDER — BISACODYL 10 MG RE SUPP
10.0000 mg | RECTAL | Status: DC | PRN
  Filled 2020-09-01: qty 1

## 2020-09-01 MED ORDER — INSULIN ASPART 100 UNIT/ML ~~LOC~~ SOLN
0.0000 [IU] | SUBCUTANEOUS | Status: DC
  Administered 2020-09-01 (×2): 15 [IU] via SUBCUTANEOUS
  Filled 2020-09-01 (×2): qty 1

## 2020-09-01 MED ORDER — BACLOFEN 10 MG PO TABS
10.0000 mg | ORAL_TABLET | Freq: Three times a day (TID) | ORAL | Status: DC | PRN
  Administered 2020-09-02 – 2020-09-06 (×11): 10 mg
  Filled 2020-09-01 (×15): qty 1

## 2020-09-01 MED ORDER — FAMOTIDINE IN NACL 20-0.9 MG/50ML-% IV SOLN
20.0000 mg | INTRAVENOUS | Status: DC
  Administered 2020-09-01 – 2020-09-04 (×4): 20 mg via INTRAVENOUS
  Filled 2020-09-01 (×4): qty 50

## 2020-09-01 NOTE — Progress Notes (Signed)
PHARMACIST - PHYSICIAN COMMUNICATION  CONCERNING:  Enoxaparin (Lovenox) for DVT Prophylaxis    RECOMMENDATION: Patient was prescribed enoxaprin 40mg  q24 hours for VTE prophylaxis.   Filed Weights   09/09/2020 0339  Weight: 92.9 kg (204 lb 11.2 oz)    Body mass index is 33.04 kg/m.  CrCl cannot be calculated (This lab value cannot be used to calculate CrCl because it is not a number: <0.30).   Based on [facility] policy patient is candidate for enoxaparin 0.5mg /kg TBW SQ every 24 hours based on BMI being >30.  However, given pt hx history including ALS, diabetes, chronically trach and PEG dependent with a suprapubic catheter in place for urinary retention, will leave pt on Lovenox 40 mg q24hr.  [facility], PharmD, [facility] 09/17/2020 9:23 AM

## 2020-09-01 NOTE — Progress Notes (Signed)
PIV consult: 0415 arrived to ED, consult canceled. Site established by ED RN.

## 2020-09-01 NOTE — Progress Notes (Signed)
PHARMACY -  BRIEF ANTIBIOTIC NOTE   Pharmacy has received consult(s) for Cefepime from an ED provider.  The patient's profile has been reviewed for ht/wt/allergies/indication/available labs.    One time order(s) placed for Cefepime 2 gm IV X 1   Further antibiotics/pharmacy consults should be ordered by admitting physician if indicated.                       Thank you, Amerika Nourse D September 29, 2020  3:45 AM

## 2020-09-01 NOTE — Progress Notes (Signed)
Pt arrived to unit at this time. Chronic trach and home vent in place. RT at bedside suctioning pt. Pt's skin is reddened and warm to touch. Pt is afebrile. VSS. Pt does not have a gag or cough reflex and pupils are nonreactive at this time also. Pt's aunt has gone home but says she will be back later.

## 2020-09-01 NOTE — ED Provider Notes (Signed)
[facility] Emergency Department Provider Note  ____________________________________________  Time seen: Approximately 3:43 AM  I have reviewed the triage vital signs and the nursing notes.   HISTORY  Chief Complaint catheter problem    Level 5 Caveat: Portions of the History and Physical including HPI and review of systems are unable to be completely obtained due to patient being a poor historian   HPI Randy Roach is a 43 y.o. male with a history of ALS, diabetes, chronically trach and PEG dependent with a suprapubic catheter in place for urinary retention is brought to the ED due to dysfunction of the suprapubic catheter.  It seems to be pulling out farther than normal, and his urine is draining through the ostomy into his brief instead of through the tubing.  Otherwise seems to be in his usual state of health, baseline including nonverbal state and unable to move.      Past Medical History:  Diagnosis Date  . ALS (amyotrophic lateral sclerosis) ([place])   . Diabetes mellitus without complication ([place])   . Respiratory failure, chronic [facility])      Patient Active Problem List   Diagnosis Date Noted  . Diabetes mellitus, type 2 ([place]) 10/14/2019  . S/P percutaneous endoscopic gastrostomy (PEG) tube placement ([place]) 09/11/2019  . Hypertrophic granulation tissue 07/24/2019  . Medication management 04/19/2019  . Sputum culture positive for Pseudomonas 10/26/2017  . Skin irritation 03/18/2017  . Chronic neuromuscular respiratory failure ([place]) 02/23/2017  . Drug-induced constipation 01/15/2017  . Left lower lobe pneumonia 12/08/2016  . Hyperglycemia 12/08/2016  . Anemia 12/05/2016  . Anxiety and depression 12/05/2016  . Sepsis due to pneumonia ([place]) 05/08/2016  . Malnutrition compromising bodily function ([place]) 03/09/2016  . 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 Pain 03/09/2016  . 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 Oropharyngeal dysphagia  08/20/2015     Past Surgical History:  Procedure Laterality Date  . APPENDECTOMY    . fractured hands    . PEG PLACEMENT N/A 04/19/2017   Procedure: PERCUTANEOUS ENDOSCOPIC GASTROSTOMY (PEG) REPLACEMENT;  Surgeon: Jonathon Bellows, MD;  Location: [facility] ENDOSCOPY;  Service: Endoscopy;  Laterality: N/A;  . PEG PLACEMENT N/A 04/19/2017   Procedure: PERCUTANEOUS ENDOSCOPIC GASTROSTOMY (PEG) PLACEMENT;  Surgeon: Jonathon Bellows, MD;  Location: [facility] ENDOSCOPY;  Service: Gastroenterology;  Laterality: N/A;  . PEG TUBE PLACEMENT Left   . TRACHEOSTOMY       Prior to Admission medications   Medication Sig Start Date End Date Taking? Authorizing Provider  acetaminophen (TYLENOL) 325 MG tablet Take 2 tablets (650 mg total) by mouth every 6 (six) hours as needed for mild pain (or Fever >/= 101). 05/11/16   Flora Lipps, MD  baclofen (LIORESAL) 10 MG tablet Place 10 mg into feeding tube every 8 (eight) hours as needed for muscle spasms.    [provider]  bisacodyl (DULCOLAX) 10 MG suppository Place 10 mg rectally as needed for moderate constipation.    [provider]  busPIRone (BUSPAR) 10 MG tablet Place 10 mg into feeding tube 3 (three) times daily.    [provider]  chlorhexidine gluconate, SAGE KIT, (PERIDEX) 0.12 % solution 15 mLs by Mouth Rinse route 2 (two) times daily. 05/11/16   Flora Lipps, MD  diazepam (VALIUM) 5 MG tablet Take 5 mg by mouth every 12 (twelve) hours as needed for anxiety.    [provider]  insulin NPH Human (NOVOLIN N) 100 UNIT/ML injection Inject 28 Units into the skin 2 (two) times daily.  05/29/19   [provider]  Nutritional Supplements (FEEDING SUPPLEMENT, OSMOLITE 1.5 CAL,) LIQD Place 237 mLs into feeding tube every 4 (four) hours. 12/12/16   Gladstone Lighter, MD  Oxycodone HCl 10 MG TABS Place 10 mg into feeding tube every 4 (four) hours as needed.    [provider]  Oxycodone HCl 20 MG TABS 25 mg by PEG Tube route  every 6 (six) hours as needed.     [provider]  polyethylene glycol (MIRALAX / GLYCOLAX) packet Place 17 g into feeding tube daily.    [provider]  scopolamine (TRANSDERM-SCOP) 1 MG/3DAYS Place 1 patch (1.5 mg total) onto the skin every 3 (three) days. 12/14/16   Gladstone Lighter, MD  silver nitrate applicators 79-39 % applicator Two to three sticks every two-three days as needed for granulation tissue. 07/31/19   Fredirick Maudlin, MD  Skin Protectants, Misc. (EUCERIN) cream Apply 1 application topically 2 (two) times daily.    [provider]  sodium phosphate (FLEET) 7-19 GM/118ML ENEM Place 133 mLs (1 enema total) rectally once as needed for severe constipation. 12/12/16   Gladstone Lighter, MD  tamsulosin (FLOMAX) 0.4 MG CAPS capsule Take 0.4 mg by mouth as needed.    [provider]  tiZANidine (ZANAFLEX) 4 MG tablet Place 4 mg into feeding tube every 8 (eight) hours as needed for muscle spasms.    [provider]  venlafaxine (EFFEXOR) 75 MG tablet Take 75 mg by mouth every morning.    [provider]  venlafaxine XR (EFFEXOR-XR) 150 MG 24 hr capsule Take 150 mg by mouth every evening.    [provider]  Water For Irrigation, Sterile (FREE WATER) SOLN Place 200 mLs into feeding tube every 8 (eight) hours. 12/12/16   Gladstone Lighter, MD  zolpidem (AMBIEN) 5 MG tablet Take 5 mg by mouth at bedtime as needed for sleep.    [provider]     Allergies Debrox [carbamide peroxide] and Latex   Family History  Problem Relation Age of Onset  . Heart disease Mother   . ALS Father     Social History Social History   Tobacco Use  . Smoking status: Former Smoker    Packs/day: 1.00    Types: Cigarettes    Quit date: 10/14/2015    Years since quitting: 4.8  . Smokeless tobacco: Never Used  Vaping Use  . Vaping Use: Never used  Substance Use Topics  . Alcohol use: No    Comment: occ  . Drug use: No     Review of Systems Level 5 Caveat: Portions of the History and Physical including HPI and review of systems are unable to be completely obtained due to patient being a poor historian   Constitutional:   No known fever.  ENT:   No rhinorrhea. Cardiovascular:   No chest pain or syncope. Gastrointestinal:   Negative for abdominal pain, vomiting and diarrhea.  Musculoskeletal:   Negative for focal pain or swelling ____________________________________________   PHYSICAL EXAM:  VITAL SIGNS: ED Triage Vitals  Enc Vitals Group     BP 09/18/2020 0338 (!) 152/108     Pulse Rate 09/19/2020 0338 (!) 115     Resp 09/11/2020 0338 18     Temp 09/12/2020 0338 99.5 F (37.5 C)     Temp Source 09/02/2020 0338 Axillary     SpO2 09/19/2020 0338 99 %     Weight 09/28/2020 0339 204 lb 11.2 oz (92.9 kg)  Height 09/29/2020 0339 5' 6"  (1.676 m)     Head Circumference --      Peak Flow --      Pain Score 09/29/2020 0338 0     Pain Loc --      Pain Edu? --      Excl. in [place]? --     Vital signs reviewed, nursing assessments reviewed.   Constitutional:   Awake.  Not oriented.  Chronically ill-appearing. Eyes:   Conjunctivae are normal. PERRL. ENT      Head:   Normocephalic and atraumatic.      Nose:   No congestion/rhinnorhea.       Mouth/Throat:   MMM, no pharyngeal erythema. No peritonsillar mass.       Neck:   No meningismus. Full ROM. Hematological/Lymphatic/Immunilogical:   No cervical lymphadenopathy. Cardiovascular:   Tachycardia heart rate 115. Symmetric bilateral radial and DP pulses.  No murmurs. Cap refill less than 2 seconds. Respiratory:   Normal respiratory effort without tachypnea/retractions. Breath sounds are clear and equal bilaterally. No wheezes/rales/rhonchi. Gastrointestinal:   Soft and nontender.  Moderately distended. There is no CVA tenderness.  No rebound, rigidity, or guarding. Genitourinary:   Normal external genitalia.  Silicon 16 French catheter in place in suprapubic  ostomy Musculoskeletal:   Normal range of motion in all extremities. No joint effusions.  No lower extremity tenderness.  No edema. Neurologic:   Nonverbal.  Unable to perform neurologic exam Skin:    Skin is warm, dry and intact. No rash noted.  No petechiae, purpura, or bullae.  ____________________________________________    LABS (pertinent positives/negatives) (all labs ordered are listed, but only abnormal results are displayed) Labs Reviewed  COMPREHENSIVE METABOLIC PANEL - Abnormal; Notable for the following components:      Result Value   Sodium 132 (*)    Chloride 97 (*)    Glucose, Bld 273 (*)    Creatinine, Ser <0.30 (*)    ALT 46 (*)    All other components within normal limits  CBC WITH DIFFERENTIAL/PLATELET - Abnormal; Notable for the following components:   Hemoglobin 12.7 (*)    RDW 15.8 (*)    All other components within normal limits  URINALYSIS, COMPLETE (UACMP) WITH MICROSCOPIC - Abnormal; Notable for the following components:   Color, Urine YELLOW (*)    APPearance TURBID (*)    Glucose, UA >=500 (*)    Hgb urine dipstick SMALL (*)    Ketones, ur 20 (*)    Protein, ur 100 (*)    Leukocytes,Ua LARGE (*)    RBC / HPF >50 (*)    WBC, UA >50 (*)    Bacteria, UA RARE (*)    Non Squamous Epithelial PRESENT (*)    All other components within normal limits  CULTURE, BLOOD (ROUTINE X 2)  CULTURE, BLOOD (ROUTINE X 2)  URINE CULTURE  RESP PANEL BY RT-PCR (FLU A&B, COVID) ARPGX2  LACTIC ACID, PLASMA  PROTIME-INR  APTT   ____________________________________________   EKG    ____________________________________________    RADIOLOGY  DG Chest Port 1 View  Result Date: 09/04/2020 CLINICAL DATA:  Sepsis EXAM: PORTABLE CHEST 1 VIEW COMPARISON:  12/10/2016 FINDINGS: Tracheostomy is in place with the retaining balloon markedly dilating the trachea. Lung volumes are extremely small and there is mild right basilar atelectasis. No pneumothorax or pleural  effusion. Cardiac size is within normal limits. Pulmonary vascularity is normal. No acute bone abnormality. IMPRESSION: Hyperinflation of the retaining balloon of the tracheostomy with  resultant marked focal dilation of the trachea. Pulmonary hypoinflation. Electronically Signed   By: Fidela Salisbury MD   On: 09/03/2020 04:27    ____________________________________________   PROCEDURES .Critical Care Performed by: Carrie Mew, MD Authorized by: Carrie Mew, MD   Critical care provider statement:    Critical care time (minutes):  35   Critical care time was exclusive of:  Separately billable procedures and treating other patients   Critical care was necessary to treat or prevent imminent or life-threatening deterioration of the following conditions:  Sepsis   Critical care was time spent personally by me on the following activities:  Development of treatment plan with patient or surrogate, discussions with consultants, evaluation of patient's response to treatment, examination of patient, obtaining history from patient or surrogate, ordering and performing treatments and interventions, ordering and review of laboratory studies, ordering and review of radiographic studies, pulse oximetry, re-evaluation of patient's condition and review of old charts Comments:         SUPRAPUBIC TUBE PLACEMENT  Date/Time: 09/15/2020 4:09 AM Performed by: Carrie Mew, MD Authorized by: Carrie Mew, MD   Consent:    Consent obtained:  Verbal   Consent given by:  Healthcare agent (aunt) Anesthesia (see MAR for exact dosages):    Anesthesia method:  None Procedure details:    Complexity:  Simple   Catheter type:  Foley   Catheter size:  16 Fr   Ultrasound guidance: no     Number of attempts:  1   Urine characteristics:  Cloudy Post-procedure details:    Patient tolerance of procedure:  Tolerated well, no immediate complications Comments:     Prior suprapubic catheter in place  but obstructed.  Prior catheter removed, replaced under sterile technique with immediate UOP of 800+mL cloudy urine.   Angiocath insertion  Date/Time: 09/30/2020 4:11 AM Performed by: Carrie Mew, MD Authorized by: Carrie Mew, MD  Preparation: Patient was prepped and draped in the usual sterile fashion. Local anesthesia used: no  Anesthesia: Local anesthesia used: no  Sedation: Patient sedated: no  Patient tolerance: patient tolerated the procedure well with no immediate complications Comments: Continuous [place] vis. 20g L forearm. 1 attempt, EBL 0     ____________________________________________  DIFFERENTIAL DIAGNOSIS   Urinary catheter dysfunction, UTI, sepsis, dehydration, electrolyte abnormality, pneumonia  CLINICAL IMPRESSION / ASSESSMENT AND PLAN / ED COURSE  Medications ordered in the ED: Medications  sodium chloride 0.9 % bolus 1,000 mL (0 mLs Intravenous Stopped 09/30/2020 0516)  ceFEPIme (MAXIPIME) 2 g in sodium chloride 0.9 % 100 mL IVPB (0 g Intravenous Stopped 09/23/2020 0516)    Pertinent labs & imaging results that were available during my care of the patient were reviewed by me and considered in my medical decision making (see chart for details).   Randy Roach was evaluated in Emergency Department on 09/16/2020 for the symptoms described in the history of present illness. He was evaluated in the context of the global COVID-19 pandemic, which necessitated consideration that the patient might be at risk for infection with the SARS-CoV-2 virus that causes COVID-19. Institutional protocols and algorithms that pertain to the evaluation of patients at risk for COVID-19 are in a state of rapid change based on information released by regulatory bodies including the CDC and federal and state organizations. These policies and algorithms were followed during the patient's care in the ED.   Patient presents with axillary temp of 99.5, tachycardia.  Will pursue  sepsis work-up, give cefepime, replace suprapubic catheter.  Clinical Course as of Sep 01 620  Wed Sep 01, 2020  0411 Prior suprapubic catheter in place but obstructed.  Prior catheter removed, replaced under sterile technique with immediate UOP of 800+mL cloudy urine.   [PS]    Clinical Course User Index [PS] Carrie Mew, MD    ----------------------------------------- 6:21 AM on 09/04/2020 ----------------------------------------- Labs confirm cystitis associated with indwelling suprapubic catheter.  Chemistry CBC and lactate are all unremarkable.  In discussion with caregiver, she feels that patient's needs are best met in the hospital when he has these recurrent UTIs, and given his chronic vent dependence, discussed with ICU team for further management.    ____________________________________________   FINAL CLINICAL IMPRESSION(S) / ED DIAGNOSES    Final diagnoses:  Infection associated with indwelling urinary catheter, initial encounter ([place])  ALS (amyotrophic lateral sclerosis) ([place])  Type 2 diabetes mellitus without complication, with long-term current use of insulin ([place])  Quadriplegia [facility])     ED Discharge Orders    None      Portions of this note were generated with dragon dictation software. Dictation errors may occur despite best attempts at proofreading.   Carrie Mew, MD 09/29/2020 [phone]

## 2020-09-01 NOTE — Progress Notes (Signed)
Patient medicated with PRN Valium and Oxycodone due to HR sustaining I the 130's.

## 2020-09-01 NOTE — Progress Notes (Signed)
Following for Code Sepsis  

## 2020-09-01 NOTE — ED Notes (Signed)
Per pt aunt, pt paralyzed except for eye movement.

## 2020-09-01 NOTE — Progress Notes (Signed)
CODE SEPSIS - PHARMACY COMMUNICATION  **Broad Spectrum Antibiotics should be administered within 1 hour of Sepsis diagnosis**  Time Code Sepsis Called/Page Received:   12/1 @ 0343  Antibiotics Ordered: Cefepime   Time of 1st antibiotic administration:  12/1 @ 0416   Additional action taken by pharmacy:   If necessary, Name of Provider/Nurse Contacted:     Baylen Dea D ,PharmD Clinical Pharmacist  09/25/2020  5:02 AM

## 2020-09-01 NOTE — ED Triage Notes (Signed)
Pt to ED via EMS from home with aunt. Pt has supra pubic catheter in place that per caregiver hasn't been working, pt has had no urine output since yesterday. Catheter was last changed 11/07.

## 2020-09-01 NOTE — H&P (Signed)
NAME:  Randy Roach, MRN:  [id number], DOB:  1976/04/04, LOS: 0 ADMISSION DATE:  09/18/2020, CONSULTATION DATE: 09/14/2020 REFERRING MD: Dr. Joni Fears, CHIEF COMPLAINT: Nonfunctioning chronic suprapubic catheter   Brief History   44 yo male with hx of ALS, chronic tracheostomy and PEG tube who is vent dependent admitted with UTI due to nonfunctioning suprapubic catheter.   History of present illness   This is a 44 yo male who presented to [facility] ER on 12/1 with urinary retention secondary to dysfunctional suprapubic catheter.  Per ER notes caregiver reported the suprapubic catheter appeared to be pulling out further than normal, with urinary drainage around the cathter site on 11/30.    Upon presentation to the ED vitals included: Temperature 99.5 F axillary, RR 18, HR 115, BP 152/108. ER lab results revealed Na+ 132, chloride 97, glucose 273, creatinine <0.30, hgb 12.7, WBC 7.2, lactic acid 1.6, and UA positive for UTI.  His COVID-19 PCR and Influenza PCR are both negative.  In the ER previous suprapubic catheter was removed and replaced with immediate return of 800+ ml of cloudy urine.  Pt also received iv cefepime.  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    Past Medical History  ALS Type II Diabetes Mellitus Chronic PEG Tube  Chronic Tracheostomy  Ventilator Dependent   Significant Hospital Events   12/1: Pt admitted to ICU with UTI secondary to nonfunctioning suprapubic catheter with hx of ALS, chronic tracheostomy, and vent dependant   Consults:  Intensivist   Procedures:  None   Significant Diagnostic Tests:  12/1: UA positive for UTI   Micro Data:  Blood x2 12/1>> Urine 12/1>> Influenza PCR/COVID-19 12/1>>negative Respiratory viral panel 12/1>>  Antimicrobials:  Cefepime 12/1 x1 dose Ceftriaxone 12/1>>  Interim history/subjective:  Pt on home vent and home vent  settings Hemodynamically stable, no pressors Afebrile   Objective   Blood pressure 126/90, pulse (!) 114, temperature 98.9 F (37.2 C), temperature source Rectal, resp. rate (!) 23, height 5' 6"  (1.676 m), weight 92.9 kg, SpO2 100 %.        Intake/Output Summary (Last 24 hours) at 09/18/2020 5284 Last data filed at 09/04/2020 0516 Gross per 24 hour  Intake 1100 ml  Output --  Net 1100 ml   Filed Weights   09/20/2020 0339  Weight: 92.9 kg    Examination: General: Acute on chronically ill appearing male, laying in bed, on home vent, in NAD HENT: Atraumatic, normocephalic, neck supple, no JVD Lungs: Clear diminished to auscultation, vent assisted, even Cardiovascular: Tachycardia, regular rhythm, s1s2, no M/R/G, 1+ distal pulses Abdomen: Obese, soft, nontender, nondistended, no guarding or rebound tenderness, PEG tube in place Extremities: upper extremities 2+ edema, muscle wasting due to ALS Neuro: Sleeping, opens eyes to voice (pt's baseline is only able to blink eyes due to ALS) GU: Suprapubic catheter in place (exchanged out earlier this morning)  Assessment & Plan:   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  -Continue home ventilator  -Maintain O2 sats >92% -Follow intermittent CXR & ABG as needed -VAP Protocol -Continue Prn bronchodilator therapy -Continue airway hygiene   UTI secondary to nonfunctioning suprapubic catheter: suprapubic catheter removed and replaced in the ER 09/28/2020 -Trend WBC and monitor fever curve  -Follow micro data as above -Continue ceftriaxone for now    Mild Hyponatremia -Monitor I&O's / urinary output -Follow BMP -Ensure adequate renal perfusion -Avoid  nephrotoxic agents as able -Replace electrolytes as indicated -NS @ 100 ml/hr   Anemia without obvious acute blood loss -Monitor for S/Sx of bleeding -Trend CBC -Lovenox for VTE Prophylaxis  -Transfuse for Hgb <7   Type II diabetes  mellitus  -CBG's q4hrs  -SSI  -Follow ICU Hypo/hyperglycemia protocol   Best practice (evaluated daily)   Diet: Continue outpatient TF's Pain/Anxiety/Delirium protocol (if indicated): Continue outpatient pain regimen  VAP protocol (if indicated): ordered  DVT prophylaxis: subq lovenox  GI prophylaxis: iv pepcid  Glucose control: SSI  Mobility: Bedrest  last date of multidisciplinary goals of care discussion N/A Family and staff present: Pt's aunt June present and updated at bedside Summary of discussion: Admitting to ICU for treatment of UTI, will continue home vent settings Follow up goals of care discussion due 09/02/2020 Code Status: Full Code  Disposition: ICU   Labs   CBC: Recent Labs  Lab 09/19/2020 0342  WBC 7.2  NEUTROABS 5.2  HGB 12.7*  HCT 39.7  MCV 89.6  PLT 756    Basic Metabolic Panel: Recent Labs  Lab 09/08/2020 0342  NA 132*  K 4.2  CL 97*  CO2 22  GLUCOSE 273*  BUN 16  CREATININE <0.30*  CALCIUM 9.6   GFR: CrCl cannot be calculated (This lab value cannot be used to calculate CrCl because it is not a number: <0.30). Recent Labs  Lab 09/08/2020 0342  WBC 7.2  LATICACIDVEN 1.6    Liver Function Tests: Recent Labs  Lab 09/03/2020 0342  AST 41  ALT 46*  ALKPHOS 112  BILITOT 0.8  PROT 7.7  ALBUMIN 3.6   No results for input(s): LIPASE, AMYLASE in the last 168 hours. No results for input(s): AMMONIA in the last 168 hours.  ABG    Component Value Date/Time   PHART 7.52 (H) 05/10/2016 0430   PCO2ART 45 05/10/2016 0430   PO2ART 101 05/10/2016 0430   HCO3 36.7 (H) 05/10/2016 0430   O2SAT 98.4 05/10/2016 0430     Coagulation Profile: Recent Labs  Lab 09/29/2020 0342  INR 0.9    Cardiac Enzymes: No results for input(s): CKTOTAL, CKMB, CKMBINDEX, TROPONINI in the last 168 hours.  HbA1C: Hemoglobin A1C  Date/Time Value Ref Range Status  10/25/2011 08:05 AM 14.5 (H) 4.2 - 6.3 % Final    Comment:    The American Diabetes Association  recommends that a primary goal of therapy should be <7% and that physicians should reevaluate the treatment regimen in patients with HbA1c values consistently >8%.    Hgb A1c MFr Bld  Date/Time Value Ref Range Status  12/08/2016 07:39 PM 5.0 4.8 - 5.6 % Final    Comment:    (NOTE)         Pre-diabetes: 5.7 - 6.4         Diabetes: >6.4         Glycemic control for adults with diabetes: <7.0     CBG: No results for input(s): GLUCAP in the last 168 hours.  Review of Systems:   Unable to assess due to chronic vent, trach, and nonverbal due to ALS   Past Medical History  He,  has a past medical history of ALS (amyotrophic lateral sclerosis) ([place]), Diabetes mellitus without complication ([place]), and Respiratory failure, chronic ([place]).   Surgical History    Past Surgical History:  Procedure Laterality Date  . APPENDECTOMY    . fractured hands    . PEG PLACEMENT N/A 04/19/2017   Procedure: PERCUTANEOUS ENDOSCOPIC  GASTROSTOMY (PEG) REPLACEMENT;  Surgeon: Jonathon Bellows, MD;  Location: [facility] ENDOSCOPY;  Service: Endoscopy;  Laterality: N/A;  . PEG PLACEMENT N/A 04/19/2017   Procedure: PERCUTANEOUS ENDOSCOPIC GASTROSTOMY (PEG) PLACEMENT;  Surgeon: Jonathon Bellows, MD;  Location: [facility] ENDOSCOPY;  Service: Gastroenterology;  Laterality: N/A;  . PEG TUBE PLACEMENT Left   . TRACHEOSTOMY       Social History   reports that he quit smoking about 4 years ago. His smoking use included cigarettes. He smoked 1.00 pack per day. He has never used smokeless tobacco. He reports that he does not drink alcohol and does not use drugs.   Family History   His family history includes ALS in his father; Heart disease in his mother.   Allergies Allergies  Allergen Reactions  . Debrox [Carbamide Peroxide]   . Latex      Home Medications  Prior to Admission medications   Medication Sig Start Date End Date Taking? Authorizing Provider  azithromycin (ZITHROMAX) 500 MG tablet 1 tablet (500 mg total) by G-tube  route every Monday, Wednesday, and Friday at 1600. 05/17/20  Yes [provider]  nystatin cream (MYCOSTATIN) Apply 1 application topically 2 (two) times daily as needed. 05/17/20  Yes [provider]  acetaminophen (TYLENOL) 325 MG tablet Take 2 tablets (650 mg total) by mouth every 6 (six) hours as needed for mild pain (or Fever >/= 101). 05/11/16   Flora Lipps, MD  baclofen (LIORESAL) 10 MG tablet Place 10 mg into feeding tube every 8 (eight) hours as needed for muscle spasms.    [provider]  bisacodyl (DULCOLAX) 10 MG suppository Place 10 mg rectally as needed for moderate constipation.    [provider]  busPIRone (BUSPAR) 10 MG tablet Place 10 mg into feeding tube 3 (three) times daily.    [provider]  chlorhexidine gluconate, SAGE KIT, (PERIDEX) 0.12 % solution 15 mLs by Mouth Rinse route 2 (two) times daily. 05/11/16   Flora Lipps, MD  diazepam (VALIUM) 5 MG tablet Take 5 mg by mouth every 12 (twelve) hours as needed for anxiety.    [provider]  famotidine (PEPCID) 20 MG tablet Take 20 mg by mouth 2 (two) times daily. 08/06/20   [provider]  insulin NPH Human (NOVOLIN N) 100 UNIT/ML injection Inject 28 Units into the skin 2 (two) times daily.  05/29/19   [provider]  NARCAN 4 MG/0.1ML LIQD nasal spray kit Place 1 spray into the nose once. 08/07/20   [provider]  Nutritional Supplements (FEEDING SUPPLEMENT, OSMOLITE 1.5 CAL,) LIQD Place 237 mLs into feeding tube every 4 (four) hours. 12/12/16   Gladstone Lighter, MD  NYSTATIN powder Apply 1 application topically See admin instructions. Apply to the affected area 2 - 3 times a day. 08/19/20   [provider]  Oxycodone HCl 10 MG TABS Place 10 mg into feeding tube every 4 (four) hours as needed.    [provider]  Oxycodone HCl 20 MG TABS 25 mg by PEG Tube route every 6 (six) hours as needed.     [provider]   polyethylene glycol (MIRALAX / GLYCOLAX) packet Place 17 g into feeding tube daily.    [provider]  scopolamine (TRANSDERM-SCOP) 1 MG/3DAYS Place 1 patch (1.5 mg total) onto the skin every 3 (three) days. 12/14/16   Gladstone Lighter, MD  silver nitrate applicators 97-35 % applicator Two to three sticks every two-three days as needed for granulation tissue. 07/31/19  Fredirick Maudlin, MD  Skin Protectants, Misc. (EUCERIN) cream Apply 1 application topically 2 (two) times daily.    [provider]  sodium phosphate (FLEET) 7-19 GM/118ML ENEM Place 133 mLs (1 enema total) rectally once as needed for severe constipation. 12/12/16   Gladstone Lighter, MD  tamsulosin (FLOMAX) 0.4 MG CAPS capsule Take 0.4 mg by mouth as needed.    [provider]  tiZANidine (ZANAFLEX) 4 MG tablet Place 4 mg into feeding tube every 8 (eight) hours as needed for muscle spasms.    [provider]  venlafaxine (EFFEXOR) 75 MG tablet Take 75 mg by mouth every morning.    [provider]  venlafaxine XR (EFFEXOR-XR) 150 MG 24 hr capsule Take 150 mg by mouth every evening.    [provider]  Water For Irrigation, Sterile (FREE WATER) SOLN Place 200 mLs into feeding tube every 8 (eight) hours. 12/12/16   Gladstone Lighter, MD  zolpidem (AMBIEN) 5 MG tablet Take 5 mg by mouth at bedtime as needed for sleep.    [provider]     Critical care time: 40 minutes      Darel Hong, [facility] [place] [facility] Medicine Pager: [phone]

## 2020-09-01 NOTE — ED Notes (Signed)
Read SBAR note... just call when you all are on the way

## 2020-09-01 NOTE — ED Notes (Signed)
ED Provider at bedside. 

## 2020-09-01 NOTE — Progress Notes (Signed)
Brief Medication Note  Confirmed pt's average MME of 217 to 224 per day via [place] CSRS:     Otelia Sergeant, PharmD, [facility] Sep 15, 2020 9:20 AM

## 2020-09-02 ENCOUNTER — Inpatient Hospital Stay: Payer: Medicare Other

## 2020-09-02 DIAGNOSIS — Z66 Do not resuscitate: Secondary | ICD-10-CM

## 2020-09-02 DIAGNOSIS — T83511A Infection and inflammatory reaction due to indwelling urethral catheter, initial encounter: Principal | ICD-10-CM

## 2020-09-02 DIAGNOSIS — G1221 Amyotrophic lateral sclerosis: Secondary | ICD-10-CM | POA: Diagnosis not present

## 2020-09-02 DIAGNOSIS — Z515 Encounter for palliative care: Secondary | ICD-10-CM

## 2020-09-02 DIAGNOSIS — Z7189 Other specified counseling: Secondary | ICD-10-CM | POA: Diagnosis not present

## 2020-09-02 LAB — GLUCOSE, CAPILLARY
Glucose-Capillary: 144 mg/dL — ABNORMAL HIGH (ref 70–99)
Glucose-Capillary: 185 mg/dL — ABNORMAL HIGH (ref 70–99)
Glucose-Capillary: 185 mg/dL — ABNORMAL HIGH (ref 70–99)
Glucose-Capillary: 190 mg/dL — ABNORMAL HIGH (ref 70–99)
Glucose-Capillary: 193 mg/dL — ABNORMAL HIGH (ref 70–99)
Glucose-Capillary: 194 mg/dL — ABNORMAL HIGH (ref 70–99)
Glucose-Capillary: 200 mg/dL — ABNORMAL HIGH (ref 70–99)
Glucose-Capillary: 206 mg/dL — ABNORMAL HIGH (ref 70–99)
Glucose-Capillary: 216 mg/dL — ABNORMAL HIGH (ref 70–99)
Glucose-Capillary: 227 mg/dL — ABNORMAL HIGH (ref 70–99)
Glucose-Capillary: 234 mg/dL — ABNORMAL HIGH (ref 70–99)
Glucose-Capillary: 238 mg/dL — ABNORMAL HIGH (ref 70–99)
Glucose-Capillary: 243 mg/dL — ABNORMAL HIGH (ref 70–99)
Glucose-Capillary: 268 mg/dL — ABNORMAL HIGH (ref 70–99)
Glucose-Capillary: 273 mg/dL — ABNORMAL HIGH (ref 70–99)
Glucose-Capillary: 279 mg/dL — ABNORMAL HIGH (ref 70–99)
Glucose-Capillary: 281 mg/dL — ABNORMAL HIGH (ref 70–99)
Glucose-Capillary: 309 mg/dL — ABNORMAL HIGH (ref 70–99)
Glucose-Capillary: 314 mg/dL — ABNORMAL HIGH (ref 70–99)
Glucose-Capillary: 383 mg/dL — ABNORMAL HIGH (ref 70–99)
Glucose-Capillary: 389 mg/dL — ABNORMAL HIGH (ref 70–99)
Glucose-Capillary: 396 mg/dL — ABNORMAL HIGH (ref 70–99)

## 2020-09-02 LAB — HEMOGLOBIN A1C
Hgb A1c MFr Bld: 8.4 % — ABNORMAL HIGH (ref 4.8–5.6)
Mean Plasma Glucose: 194.38 mg/dL

## 2020-09-02 LAB — URINE CULTURE

## 2020-09-02 LAB — POTASSIUM
Potassium: 3.1 mmol/L — ABNORMAL LOW (ref 3.5–5.1)
Potassium: 5.2 mmol/L — ABNORMAL HIGH (ref 3.5–5.1)

## 2020-09-02 LAB — CBC
HCT: 40.6 % (ref 39.0–52.0)
Hemoglobin: 13 g/dL (ref 13.0–17.0)
MCH: 28.8 pg (ref 26.0–34.0)
MCHC: 32 g/dL (ref 30.0–36.0)
MCV: 89.8 fL (ref 80.0–100.0)
Platelets: 353 10*3/uL (ref 150–400)
RBC: 4.52 MIL/uL (ref 4.22–5.81)
RDW: 15.9 % — ABNORMAL HIGH (ref 11.5–15.5)
WBC: 6.7 10*3/uL (ref 4.0–10.5)
nRBC: 0 % (ref 0.0–0.2)

## 2020-09-02 LAB — PHOSPHORUS
Phosphorus: 4.1 mg/dL (ref 2.5–4.6)
Phosphorus: 2.9 mg/dL (ref 2.5–4.6)

## 2020-09-02 LAB — MAGNESIUM: Magnesium: 2.4 mg/dL (ref 1.7–2.4)

## 2020-09-02 LAB — BASIC METABOLIC PANEL
Anion gap: 14 (ref 5–15)
BUN: 13 mg/dL (ref 6–20)
CO2: 21 mmol/L — ABNORMAL LOW (ref 22–32)
Calcium: 9.4 mg/dL (ref 8.9–10.3)
Chloride: 102 mmol/L (ref 98–111)
Creatinine, Ser: <0.3 mg/dL — ABNORMAL LOW (ref 0.61–1.24)
Glucose, Bld: 439 mg/dL — ABNORMAL HIGH (ref 70–99)
Potassium: 4.2 mmol/L (ref 3.5–5.1)
Sodium: 137 mmol/L (ref 135–145)

## 2020-09-02 LAB — LACTIC ACID, PLASMA: Lactic Acid, Venous: 1.6 mmol/L (ref 0.5–1.9)

## 2020-09-02 LAB — HIV ANTIBODY (ROUTINE TESTING W REFLEX): HIV Screen 4th Generation wRfx: NONREACTIVE

## 2020-09-02 MED ORDER — DORNASE ALFA 2.5 MG/2.5ML IN SOLN
2.5000 mg | Freq: Every day | RESPIRATORY_TRACT | Status: DC
  Administered 2020-09-02 – 2020-09-04 (×3): 2.5 mg via RESPIRATORY_TRACT
  Filled 2020-09-02 (×3): qty 2.5

## 2020-09-02 MED ORDER — INSULIN REGULAR(HUMAN) IN NACL 100-0.9 UT/100ML-% IV SOLN
INTRAVENOUS | Status: DC
  Administered 2020-09-02: 23 [IU]/h via INTRAVENOUS
  Administered 2020-09-02: 17 [IU]/h via INTRAVENOUS
  Administered 2020-09-02: 19 [IU]/h via INTRAVENOUS
  Administered 2020-09-02: 30 [IU]/h via INTRAVENOUS
  Administered 2020-09-03: 17 [IU]/h via INTRAVENOUS
  Administered 2020-09-03 (×2): 14 [IU]/h via INTRAVENOUS
  Administered 2020-09-03: 16 [IU]/h via INTRAVENOUS
  Administered 2020-09-04: 7.5 [IU]/h via INTRAVENOUS
  Administered 2020-09-04: 9.5 [IU]/h via INTRAVENOUS
  Administered 2020-09-05: 13 [IU]/h via INTRAVENOUS
  Filled 2020-09-02 (×11): qty 100

## 2020-09-02 MED ORDER — INSULIN ASPART 100 UNIT/ML ~~LOC~~ SOLN
0.0000 [IU] | SUBCUTANEOUS | Status: DC
  Administered 2020-09-02: 20 [IU] via SUBCUTANEOUS
  Filled 2020-09-02 (×2): qty 1

## 2020-09-02 MED ORDER — OSMOLITE 1.2 CAL PO LIQD: 1000.0000 mL | ORAL | Status: DC

## 2020-09-02 MED ORDER — GLUCERNA 1.5 CAL PO LIQD
1000.0000 mL | ORAL | Status: AC
  Administered 2020-09-02: 1000 mL

## 2020-09-02 MED ORDER — SODIUM CHLORIDE 0.9% FLUSH
10.0000 mL | Freq: Two times a day (BID) | INTRAVENOUS | Status: DC
  Administered 2020-09-02 – 2020-09-07 (×10): 10 mL

## 2020-09-02 MED ORDER — SODIUM CHLORIDE 0.9% FLUSH: 10.0000 mL | INTRAVENOUS | Status: DC | PRN

## 2020-09-02 MED ORDER — SODIUM CHLORIDE 0.9 % IV BOLUS
500.0000 mL | Freq: Once | INTRAVENOUS | Status: AC
  Administered 2020-09-02: 500 mL via INTRAVENOUS

## 2020-09-02 MED ORDER — INSULIN ASPART 100 UNIT/ML ~~LOC~~ SOLN
15.0000 [IU] | Freq: Once | SUBCUTANEOUS | Status: AC
  Administered 2020-09-02: 15 [IU] via SUBCUTANEOUS

## 2020-09-02 MED ORDER — SODIUM CHLORIDE 0.9 % IV SOLN: INTRAVENOUS | Status: DC

## 2020-09-02 MED ORDER — ORAL CARE MOUTH RINSE
15.0000 mL | OROMUCOSAL | Status: DC
  Administered 2020-09-02 – 2020-09-07 (×51): 15 mL via OROMUCOSAL

## 2020-09-02 MED ORDER — DEXTROSE-NACL 5-0.45 % IV SOLN: INTRAVENOUS | Status: DC

## 2020-09-02 MED ORDER — DEXTROSE 50 % IV SOLN: 0.0000 mL | INTRAVENOUS | Status: DC | PRN

## 2020-09-02 MED ORDER — GLUCERNA 1.5 CAL PO LIQD
1425.0000 mL | ORAL | Status: DC
  Administered 2020-09-03: 1425 mL
  Administered 2020-09-03: 1000 mL
  Administered 2020-09-04 – 2020-09-05 (×2): 1425 mL
  Administered 2020-09-05: 1000 mL
  Administered 2020-09-06 – 2020-09-07 (×3): 1425 mL

## 2020-09-02 MED ORDER — POTASSIUM CHLORIDE 20 MEQ PO PACK
40.0000 meq | PACK | ORAL | Status: AC
  Administered 2020-09-02 (×2): 40 meq
  Filled 2020-09-02 (×2): qty 2

## 2020-09-02 MED ORDER — FREE WATER
30.0000 mL | Status: DC
  Administered 2020-09-02 – 2020-09-04 (×12): 30 mL

## 2020-09-02 MED ORDER — MORPHINE SULFATE (PF) 2 MG/ML IV SOLN
2.0000 mg | INTRAVENOUS | Status: DC | PRN
  Administered 2020-09-02 – 2020-09-07 (×8): 2 mg via INTRAVENOUS
  Filled 2020-09-02 (×8): qty 1

## 2020-09-02 NOTE — Progress Notes (Signed)
HR up to 140's, discussed with Cheryll Cockayne, OK to give 10 mg Oxycodone.

## 2020-09-02 NOTE — Consult Note (Signed)
PHARMACY CONSULT NOTE  Pharmacy Consult for Electrolyte Monitoring and Replacement   Recent Labs: Potassium (mmol/L)  Date Value  09/02/2020 5.2 (H)  01/12/2015 3.8   Magnesium (mg/dL)  Date Value  16/38/4665 2.4   Calcium (mg/dL)  Date Value  99/35/7017 9.4   Calcium, Total (mg/dL)  Date Value  79/39/0300 9.0   Albumin (g/dL)  Date Value  92/33/0076 3.6  01/12/2015 4.1   Phosphorus (mg/dL)  Date Value  22/63/3354 2.9   Sodium (mmol/L)  Date Value  09/02/2020 137  01/12/2015 137    Assessment: Patient is a 44 y/o M with medical history including ALS s/p tracheostomy and PEG tube who is vent dependent who was admitted 12/1 with UTI due to nonfunctioning suprapubic catheter. Patient further with difficult to control blood sugars and requiring an insulin infusion.  Per MAR, insulin infusion most recently running at 23 units/hr and, despite this, patient still with hyperglycemia with BG > 200 mg/dL  Goal of Therapy:  Electrolytes within normal limits  Plan:  Potassium slightly elevated. Expect to trend down as patient continues on insulin drip. Will follow up with morning labs.  Pricilla Riffle, PharmD 09/02/2020 6:47 PM

## 2020-09-02 NOTE — Progress Notes (Signed)
[facility] Room Art gallery manager [facility]) Hospital Liaison RN note:  This patient is currently followed by out patient based palliative care with [employer]. Teresita Maxey, TOC is aware. Will follow for disposition and update the team.  Thank you.  Cyndra Numbers, RN [facility] Liaison [phone]

## 2020-09-02 NOTE — Progress Notes (Signed)
Patient continues to be tachycardic. Randy Cockayne NP spoke to patients aunt who stated they give him frequent pain med's. Ok per Randy Cockayne NP to give Oxycodone 25 mg, aware patient received Oxycodone 10 mg at aprox 2230.

## 2020-09-02 NOTE — Progress Notes (Signed)
Goals of Care  Noted in the EMR that Palliative care had completed a home visit with the patient on 08/25/20.  At that visit Ms. Garner Nash, NP documented the patient as having a MOST form and an active DNR code status. I called and verified with the patient's aunt that the patient has a MOST form and that his code status is DNR.  Requested a copy of the MOST form.  CODE STATUS changed to DNR   Cheryll Cockayne [place], AGACNP-BC Acute Care Nurse Practitioner [place] [facility]   [phone] / [phone] Please see Amion for pager details.

## 2020-09-02 NOTE — Consult Note (Addendum)
Consultation Note Date: 09/02/2020   Patient Name: Randy Roach  DOB: 1975-11-17  MRN: [id number]  Age / Sex: 44 y.o., male  PCP: Straub, Randy Kansky, MD Referring Physician: Ottie Glazier, MD  Reason for Consultation: Establishing goals of care  HPI/Patient Profile: 44 y.o. male  with past medical history of T2DM, ALS, chronic trach and PEG, and vent dependence admitted on 09/04/2020 with urinary retention secondary to dysfunctional suprapubic catheter. UA positive for UTI.  Started on IV antibiotics. PMT consulted as patient is followed by palliative at home.   Clinical Assessment and Goals of Care: I have reviewed medical records including EPIC notes, labs and imaging, received report from RN, assessed the patient and then spoke with patient's aunt and full-time caregiver, Randy Roach,  to discuss diagnosis prognosis, [place], EOL wishes, disposition and options.  I introduced Palliative Medicine as specialized medical care for people living with serious illness. It focuses on providing relief from the symptoms and stress of a serious illness. The goal is to improve quality of life for both the patient and the family.  We reviewed Randy Roach's baseline - he is bed bound and nonverbal. Requires total care from her. Vent dependent and receives nourishment through PEG tube only. Randy does feel that patient is able to communicate with his eyes - blinks for yes and moves eyes laterally for no.   Randy reviews that she has home health RNs who help provide care ~12 hours/day - however, there are many shifts that are not covered and Randy ends up providing 24/7 care independently.   Randy shares the patient's parents are deceased. He has 2 brothers however they are not involved in patient's care.    We discussed patient's current illness and what it means in the larger context of patient's on-going co-morbidities.  Natural disease trajectory and expectations at EOL  were discussed. We discussed patient is at high risk for complications - Randy expresses understanding. We discuss that life expectancy is limited - she agrees and understands.   Junes biggest concern is Randy Roach's symptom management - she would like him to received medications as received at home including scheduled oxycodone, scheduled baclofen, and scheduled valium.   Randy's goals are for [place] to "treat the treatable" - continue IV antibiotics and symptom management. She is hopeful he can return home and continue care at home as he was prior to admission.   We did review his code status - he is DNR and Randy agrees this is appropriate.   Discussed with Randy the importance of continued conversation with family and the medical providers regarding overall plan of care and treatment options, ensuring decisions are within the context of the patient's values and GOCs.    Hospice and Palliative Care services outpatient were explained and offered. Randy has been offered hospice services in the past and is agreeable to hospice philosophy of care; however, she receives more support through [facility] home health therefore chooses home health over hospice. She is followed closely by outpatient palliative and plans to continue this.   We did discuss HCPOA since Randy is primary caregiver but is not technically next of kin - his brothers are. Historically, brothers have not been involved in his care or decision making - though I do see that his MOST that is in [place] was signed by a brother. Randy is working on obtaining HCPOA documentation independently.   Questions and concerns were addressed. The family was encouraged to call with questions or concerns.   Primary  Decision Maker OTHER - Randy has historically served as patient's caregiver and Media planner though his brothers are next of kin, brothers have not been as involved in patient's medical care  SUMMARY OF RECOMMENDATIONS   - Goals are to treat the treatable -  continue IV antibiotics, maintain DNR status  - aunt is most concerned about symptom management, requests that his home medications be resumed as follows: oxycodone 25 mg QID, baclofen 10 mg QID, and valium 5 mg BID - she asks that these be scheduled rather than PRN - MD and pharmacy aware of request - holding off for now to monitor neurologic status - request these be initiated as soon as appropriate as this is most in line with goals of care to "treat the treatable" but promote comfort and manage symptoms - though aunt would agree to hospice care at home, she is currently receiving more support with home health and requests this continue - outpatient palliative to continue to follow  Code Status/Advance Care Planning:  DNR  Prognosis:   Unable to determine - at risk for acute decompensation  Discharge Planning: aunt hopeful for return home with home health, continue OP palliative      Primary Diagnoses: Present on Admission: . UTI (urinary tract infection)   I have reviewed the medical record, interviewed the patient and family, and examined the patient. The following aspects are pertinent.  Past Medical History:  Diagnosis Date  . ALS (amyotrophic lateral sclerosis) ([place])   . Diabetes mellitus without complication ([place])   . Respiratory failure, chronic ([facility])    Social History   Socioeconomic History  . Marital status: Divorced    Spouse name: Not on file  . Number of children: Not on file  . Years of education: Not on file  . Highest education level: Not on file  Occupational History  . Not on file  Tobacco Use  . Smoking status: Former Smoker    Packs/day: 1.00    Types: Cigarettes    Quit date: 10/14/2015    Years since quitting: 4.8  . Smokeless tobacco: Never Used  Vaping Use  . Vaping Use: Never used  Substance and Sexual Activity  . Alcohol use: No    Comment: occ  . Drug use: No  . Sexual activity: Not on file  Other Topics Concern  . Not on file   Social History Narrative  . Not on file   Social Determinants of Health   Financial Resource Strain:   . Difficulty of Paying Living Expenses: Not on file  Food Insecurity:   . Worried About Charity fundraiser in the Last Year: Not on file  . Ran Out of Food in the Last Year: Not on file  Transportation Needs:   . Lack of Transportation (Medical): Not on file  . Lack of Transportation (Non-Medical): Not on file  Physical Activity:   . Days of Exercise per Week: Not on file  . Minutes of Exercise per Session: Not on file  Stress:   . Feeling of Stress : Not on file  Social Connections:   . Frequency of Communication with Friends and Family: Not on file  . Frequency of Social Gatherings with Friends and Family: Not on file  . Attends Religious Services: Not on file  . Active Member of Clubs or Organizations: Not on file  . Attends Archivist Meetings: Not on file  . Marital Status: Not on file   Family History  Problem Relation Age  of Onset  . Heart disease Mother   . ALS Father    Scheduled Meds: . busPIRone  10 mg Per Tube TID  . chlorhexidine gluconate (MEDLINE KIT)  15 mL Mouth Rinse BID  . Chlorhexidine Gluconate Cloth  6 each Topical Daily  . dornase alpha  2.5 mg Nebulization Daily  . enoxaparin (LOVENOX) injection  40 mg Subcutaneous Q24H  . feeding supplement (OSMOLITE 1.5 CAL)  237 mL Per Tube Q4H  . hydrocerin  1 application Topical BID  . mouth rinse  15 mL Mouth Rinse 10 times per day  . polyethylene glycol  17 g Per Tube Daily  . scopolamine  1 patch Transdermal Q72H  . sodium chloride flush  10-40 mL Intracatheter Q12H  . venlafaxine XR  150 mg Oral QPM   Continuous Infusions: . cefTRIAXone (ROCEPHIN)  IV Stopped (09/19/2020 1210)  . dextrose 5 % and 0.45% NaCl 10 mL/hr at 09/02/20 0952  . famotidine (PEPCID) IV Stopped (09/02/20 0612)  . insulin 23 mL/hr at 09/02/20 0900   PRN Meds:.acetaminophen, baclofen, bisacodyl, dextrose, diazepam,  ipratropium-albuterol, ondansetron (ZOFRAN) IV, oxyCODONE, oxyCODONE, sodium chloride flush, sodium phosphate, tamsulosin, tiZANidine, zolpidem Allergies  Allergen Reactions  . Debrox [Carbamide Peroxide]   . Latex    Review of Systems  Unable to perform ROS: Mental status change    Physical Exam Constitutional:      Comments: Eyes open, moves eyes to voice  Cardiovascular:     Rate and Rhythm: Tachycardia present.  Pulmonary:     Effort: Pulmonary effort is normal.  Skin:    Comments: Cool LLE     Vital Signs: BP (!) 142/95   Pulse (!) 126   Temp 99.3 F (37.4 C)   Resp (!) 23   Ht 5' 6"  (1.676 m)   Wt 79.6 kg   SpO2 99%   BMI 28.32 kg/m  Pain Scale: CPOT   Pain Score: 0-No pain   SpO2: SpO2: 99 % O2 Device:SpO2: 99 % O2 Flow Rate: .   IO: Intake/output summary:   Intake/Output Summary (Last 24 hours) at 09/02/2020 1045 Last data filed at 09/02/2020 0900 Gross per 24 hour  Intake 2908.59 ml  Output 3865 ml  Net -956.41 ml    LBM: Last BM Date: 09/02/20 Baseline Weight: Weight: 92.9 kg Most recent weight: Weight: 79.6 kg     Palliative Assessment/Data: PPS 30%    Time Total: 85 minutes Greater than 50%  of this time was spent counseling and coordinating care related to the above assessment and plan.  Juel Burrow, DNP, AGNP-C Palliative Medicine Team [phone] Pager: [phone]

## 2020-09-02 NOTE — Progress Notes (Signed)
Inpatient Diabetes Program Recommendations  AACE/ADA: New Consensus Statement on Inpatient Glycemic Control (2015)  Target Ranges:  Prepandial:   less than 140 mg/dL      Peak postprandial:   less than 180 mg/dL (1-2 hours)      Critically ill patients:  140 - 180 mg/dL   Lab Results  Component Value Date   GLUCAP 234 (H) 09/02/2020   HGBA1C 8.5 (H) 09/26/2020    Review of Glycemic Control Results for Randy Roach, Randy Roach (MRN [id number]) as of 09/02/2020 09:45  Ref. Range 09/02/2020 06:33 09/02/2020 07:30 09/02/2020 08:28 09/02/2020 09:32  Glucose-Capillary Latest Ref Range: 70 - 99 mg/dL 262 (H) 035 (H) 597 (H) 234 (H)   Diabetes history: Type 2 DM Outpatient Diabetes medications: NPH 28 units BID Current orders for Inpatient glycemic control: IV insulin, Levemir 65 units x 1 Osmolite 237 ml Q4H  Inpatient Diabetes Program Recommendations:    Current IV insulin drip rates at 20 units/hr and this is with the Levemir still on board. Assuming dosages elevated related to infectious process. Would recommend continuing IV insulin at this time. Following.   Thanks, Lujean Rave, MSN, RNC-OB Diabetes Coordinator [phone] (8a-5p)

## 2020-09-02 NOTE — Progress Notes (Signed)
Initial Nutrition Assessment  DOCUMENTATION CODES:   Not applicable  INTERVENTION:  Initiate Glucerna 1.5 Cal at 75 mL/hr x 19 hours 0800-0300 (1425 mL goal daily volume). Provides 2138 kcal, 118 grams of protein, 1083 mL H2O daily.  Provide minimum free water flush of 30 mL Q4hrs to maintain tube patency. Further free water flush per MD. Patient's home free water regimen is 180 mL QID.  NUTRITION DIAGNOSIS:   Inadequate oral intake related to inability to eat as evidenced by NPO status (reliance on tube feeds/free water via G-tube to meet calorie/protein/hydration needs).  GOAL:   Patient will meet greater than or equal to 90% of their needs  MONITOR:   PO intake, Supplement acceptance, Labs, Weight trends, I & O's  REASON FOR ASSESSMENT:   Ventilator, Consult Enteral/tube feeding initiation and management  ASSESSMENT:   44 year old male with PMHx of ALS, DM, chronic trach and PEG, vent dependence admitted with urinary retention secondary to dysfunctional suprapubic catheter, UTI.   Met with patient at bedside. He is bedbound and nonverbal at baseline and receives total care from his full-time caregiver/aunt. Spoke with patient's aunt over the phone. She reports patient is now on Glucerna 1.5 Cal at 75 mL/hr x 19 hours daily (total of 6 cartons) beginning at around 7:30-8am. She flushes tube with 180 mL free water 4 times daily (0800, 1200, 1600, 2000). Regimen provides 2136 kcal, 118 grams of protein, 1080 mL H2O daily (total water is 1800 mL including free water flushes).  Aunt reports she is not able to weigh patient at home so she is unsure of his weight history or dry weight. She reports during admission to [facility] in January he was underweight and malnourished at that time. RD found weight of 42.4 kg on 11/25/2019, 66 kg on 03/13/2020. Patient currently documented to be 79.6 kg (175.49 lbs) but this is falsely elevated from fluid. Unable to determine dry weight at this  time.  Aunt reports he always has some level of edema, especially in his hands. She reports he has had significantly more edema since late October when he became sick.  Patient is currently intubated on ventilator support MV: 12.4 L/min Temp (24hrs), Avg:98.8 F (37.1 C), Min:97.9 F (36.6 C), Max:100.5 F (38.1 C)  Medications reviewed and include: Miralax, ceftriaxone, famotidine, insulin gtt.  Labs reviewed: CBG 216-314, CO2 21, Creatinine <0.3.  Enteral Access: 22 Fr. Kangaroo G-tube present on admission  RD suspects patient is malnourished but unable to complete full NFPE due to significant edema/anasarca.  Discussed with RN and on rounds.   NUTRITION - FOCUSED PHYSICAL EXAM:    Most Recent Value  Orbital Region Unable to assess  [edema]  Upper Arm Region Unable to assess  [edema]  Thoracic and Lumbar Region Unable to assess  [edema]  Buccal Region Unable to assess  [edema]  Temple Region No depletion  Clavicle Bone Region Unable to assess  [edema]  Clavicle and Acromion Bone Region Unable to assess  [edema]  Scapular Bone Region Unable to assess  [edema]  Dorsal Hand Unable to assess  [edema]  Patellar Region Moderate depletion  Anterior Thigh Region Moderate depletion  Posterior Calf Region Severe depletion  Edema (RD Assessment) Severe  Hair Reviewed  Eyes Unable to assess  Mouth Unable to assess  Skin Reviewed  Nails Reviewed     Diet Order:   Diet Order            Diet NPO time specified  Diet effective  now                EDUCATION NEEDS:   No education needs have been identified at this time  Skin:  Skin Assessment: Reviewed RN Assessment  Last BM:  09/02/2020 - medium type 5  Height:   Ht Readings from Last 1 Encounters:  09/18/2020 5' 6"  (1.676 m)   Weight:   Wt Readings from Last 1 Encounters:  09/02/20 79.6 kg   BMI:  Body mass index is 28.32 kg/m.  Estimated Nutritional Needs:   Kcal:  1850-2150  Protein:  100-115  grams  Fluid:  per MD  Jacklynn Barnacle, MS, RD, LDN Pager number available on Amion

## 2020-09-02 NOTE — Progress Notes (Signed)
NAME:  Randy Roach, MRN:  [id number], DOB:  1975-10-30, LOS: 1 ADMISSION DATE:  09/24/2020, CONSULTATION DATE: 09/15/2020 REFERRING MD: Dr. Joni Fears, CHIEF COMPLAINT: Nonfunctioning chronic suprapubic catheter   Brief History   44 yo male with hx of ALS, chronic tracheostomy and PEG tube who is vent dependent admitted with UTI due to nonfunctioning suprapubic catheter.   History of present illness   This is a 44 yo male who presented to [facility] ER on 12/1 with urinary retention secondary to dysfunctional suprapubic catheter.  Per ER notes caregiver reported the suprapubic catheter appeared to be pulling out further than normal, with urinary drainage around the cathter site on 11/30.    Upon presentation to the ED vitals included: Temperature 99.5 F axillary, RR 18, HR 115, BP 152/108. ER lab results revealed Na+ 132, chloride 97, glucose 273, creatinine <0.30, hgb 12.7, WBC 7.2, lactic acid 1.6, and UA positive for UTI.  His COVID-19 PCR and Influenza PCR are both negative.  In the ER previous suprapubic catheter was removed and replaced with immediate return of 800+ ml of cloudy urine.  Pt also received iv cefepime.  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     09/02/20-  Patient in anasarca unresponsive, septic with UTI and background ALS with muscle atrophy.   Past Medical History  ALS Type II Diabetes Mellitus Chronic PEG Tube  Chronic Tracheostomy  Ventilator Dependent   Significant Hospital Events   12/1: Pt admitted to ICU with UTI secondary to nonfunctioning suprapubic catheter with hx of ALS, chronic tracheostomy, and vent dependant   Consults:  Intensivist   Procedures:  None   Significant Diagnostic Tests:  12/1: UA positive for UTI   Micro Data:  Blood x2 12/1>> Urine 12/1>> Influenza PCR/COVID-19 12/1>>negative Respiratory viral panel 12/1>>  Antimicrobials:  Cefepime 12/1 x1  dose Ceftriaxone 12/1>>  Interim history/subjective:  Pt on home vent and home vent settings Hemodynamically stable, no pressors Afebrile   Objective   Blood pressure 121/80, pulse (!) 137, temperature (!) 100.5 F (38.1 C), temperature source Axillary, resp. rate (!) 21, height 5' 6"  (1.676 m), weight 79.6 kg, SpO2 98 %.    Vent Mode: SIMV/PC/PS FiO2 (%):  [32 %] 32 % Set Rate:  [12 bmp] 12 bmp Vt Set:  [500 mL] 500 mL PEEP:  [5 cmH20] 5 cmH20 Pressure Support:  [20 cmH20] 20 cmH20   Intake/Output Summary (Last 24 hours) at 09/02/2020 1302 Last data filed at 09/02/2020 1200 Gross per 24 hour  Intake 4733.32 ml  Output 3865 ml  Net 868.32 ml   Filed Weights   09/13/2020 0339 09/02/20 0500  Weight: 92.9 kg 79.6 kg    Examination: General: Acute on chronically ill appearing male, laying in bed, on home vent, in NAD HENT: Atraumatic, normocephalic, neck supple, no JVD Lungs: Clear diminished to auscultation, vent assisted, even Cardiovascular: Tachycardia, regular rhythm, s1s2, no M/R/G, 1+ distal pulses Abdomen: Obese, soft, nontender, nondistended, no guarding or rebound tenderness, PEG tube in place Extremities: upper extremities 2+ edema, muscle wasting due to ALS Neuro: Sleeping, opens eyes to voice (pt's baseline is only able to blink eyes due to ALS) GU: Suprapubic catheter in place (exchanged out earlier this morning)  Assessment & Plan:   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 -Trend WBC and monitor fever curve  -Follow micro data as above -Continue ceftriaxone for now   Severe anasarca   - related to sever protein calorie  malnutrition and sepsis   PEG status     -  KUB >>> no obstruction     - Feeds/nourishment ongoing     - RD on case - glucerna via PEG to goal   ALS - Chronic    - patient currently unresponsive    - he takes numerous very high doses of centrally acting medications - I have asked to decrease dosages to allow patient to have improved sensorium however have also ordered these medications as prn in case he has signs of withdrawal at any point.    Anemia without obvious acute blood loss -Monitor for S/Sx of bleeding -Trend CBC -Lovenox for VTE Prophylaxis  -Transfuse for Hgb <7   Type II diabetes mellitus -with severe uncotrolled hyperglycemia     - continue insulin drip      - RD has been working on changing nourishment to [employer] -CBG's q1hrs  -SSI  -Follow ICU Hypo/hyperglycemia protocol   Best practice (evaluated daily)   Diet: Continue outpatient TF's Pain/Anxiety/Delirium protocol (if indicated): Continue outpatient pain regimen  VAP protocol (if indicated): ordered  DVT prophylaxis: subq lovenox  GI prophylaxis: iv pepcid  Glucose control: SSI INSULIN DRIP Mobility: Bedrest  last date of multidisciplinary goals of care discussion N/A Family and staff present: Pt's aunt June present and updated at bedside Summary of discussion: Admitting to ICU for treatment of UTI, will continue home vent settings Follow up goals of care discussion due 09/02/2020 Code Status: DNR Disposition: ICU   Labs   CBC: Recent Labs  Lab 09/14/2020 0342 09/02/20 0011  WBC 7.2 6.7  NEUTROABS 5.2  --   HGB 12.7* 13.0  HCT 39.7 40.6  MCV 89.6 89.8  PLT 313 675    Basic Metabolic Panel: Recent Labs  Lab 09/03/2020 0342 09/02/20 0011 09/02/20 1056  NA 132* 137  --   K 4.2 4.2 3.1*  CL 97* 102  --   CO2 22 21*  --   GLUCOSE 273* 439*  --   BUN 16 13  --   CREATININE <0.30* <0.30*  --   CALCIUM 9.6 9.4  --   MG  --  2.4  --   PHOS  --  4.1  --    GFR: CrCl cannot be  calculated (This lab value cannot be used to calculate CrCl because it is not a number: <0.30). Recent Labs  Lab 09/15/2020 0342 09/02/20 0011  WBC 7.2 6.7  LATICACIDVEN 1.6 1.6    Liver Function Tests: Recent Labs  Lab 09/28/2020 0342  AST 41  ALT 46*  ALKPHOS 112  BILITOT 0.8  PROT 7.7  ALBUMIN 3.6   No results for input(s): LIPASE, AMYLASE in the last 168 hours. No results for input(s): AMMONIA in the last 168 hours.  ABG    Component Value Date/Time   PHART 7.52 (H) 05/10/2016 0430   PCO2ART 45 05/10/2016 0430   PO2ART 101 05/10/2016 0430   HCO3 36.7 (H) 05/10/2016 0430   O2SAT 98.4 05/10/2016 0430     Coagulation Profile: Recent Labs  Lab 09/12/2020 0342  INR 0.9    Cardiac  Enzymes: No results for input(s): CKTOTAL, CKMB, CKMBINDEX, TROPONINI in the last 168 hours.  HbA1C: Hemoglobin A1C  Date/Time Value Ref Range Status  10/25/2011 08:05 AM 14.5 (H) 4.2 - 6.3 % Final    Comment:    The American Diabetes Association recommends that a primary goal of therapy should be <7% and that physicians should reevaluate the treatment regimen in patients with HbA1c values consistently >8%.    Hgb A1c MFr Bld  Date/Time Value Ref Range Status  09/24/2020 03:42 AM 8.5 (H) 4.8 - 5.6 % Final    Comment:    (NOTE) Pre diabetes:          5.7%-6.4%  Diabetes:              >6.4%  Glycemic control for   <7.0% adults with diabetes   12/08/2016 07:39 PM 5.0 4.8 - 5.6 % Final    Comment:    (NOTE)         Pre-diabetes: 5.7 - 6.4         Diabetes: >6.4         Glycemic control for adults with diabetes: <7.0     CBG: Recent Labs  Lab 09/02/20 0828 09/02/20 0932 09/02/20 1030 09/02/20 1123 09/02/20 1208  GLUCAP 268* 234* 216* 279* 281*    Review of Systems:   Unable to assess due to chronic vent, trach, and nonverbal due to ALS   Past Medical History  He,  has a past medical history of ALS (amyotrophic lateral sclerosis) ([place]), Diabetes mellitus without  complication ([place]), and Respiratory failure, chronic ([place]).   Surgical History    Past Surgical History:  Procedure Laterality Date  . APPENDECTOMY    . fractured hands    . PEG PLACEMENT N/A 04/19/2017   Procedure: PERCUTANEOUS ENDOSCOPIC GASTROSTOMY (PEG) REPLACEMENT;  Surgeon: Jonathon Bellows, MD;  Location[facility] END[facility];  Service: Endoscopy;  Laterality: N/A;  . PEG PLACEMENT N/A 04/19/2017   Procedure: PERCUTANEOUS ENDOSCOPIC GASTROSTOMY (PEG) PLACEMENT;  Surgeon: Jonathon Bellows, MD;  Location: [facility] ENDOSCOPY;  Service: Gastroenterology;  Laterality: N/A;  . PEG TUBE PLACEMENT Left   . TRACHEOSTOMY       Social History   reports that he quit smoking about 4 years ago. His smoking use included cigarettes. He smoked 1.00 pack per day. He has never used smokeless tobacco. He reports that he does not drink alcohol and does not use drugs.   Family History   His family history includes ALS in his father; Heart disease in his mother.   Allergies Allergies  Allergen Reactions  . Debrox [Carbamide Peroxide]   . Latex      Home Medications  Prior to Admission medications   Medication Sig Start Date End Date Taking? Authorizing Provider  azithromycin (ZITHROMAX) 500 MG tablet 1 tablet (500 mg total) by G-tube route every Monday, Wednesday, and Friday at 1600. 05/17/20  Yes [provider]  nystatin cream (MYCOSTATIN) Apply 1 application topically 2 (two) times daily as needed. 05/17/20  Yes [provider]  acetaminophen (TYLENOL) 325 MG tablet Take 2 tablets (650 mg total) by mouth every 6 (six) hours as needed for mild pain (or Fever >/= 101). 05/11/16   Flora Lipps, MD  baclofen (LIORESAL) 10 MG tablet Place 10 mg into feeding tube every 8 (eight) hours as needed for muscle spasms.    [provider]  bisacodyl (DULCOLAX) 10 MG suppository Place 10 mg rectally as needed for moderate constipation.    [provider]  busPIRone (BUSPAR) 10 MG tablet Place 10  mg into feeding tube 3 (three) times daily.    [provider]  chlorhexidine gluconate, SAGE KIT, (PERIDEX) 0.12 % solution 15 mLs by Mouth Rinse route 2 (two) times daily. 05/11/16   Flora Lipps, MD  diazepam (VALIUM) 5 MG tablet Take 5 mg by mouth every 12 (twelve) hours as needed for anxiety.    [provider]  famotidine (PEPCID) 20 MG tablet Take 20 mg by mouth 2 (two) times daily. 08/06/20   [provider]  insulin NPH Human (NOVOLIN N) 100 UNIT/ML injection Inject 28 Units into the skin 2 (two) times daily.  05/29/19   [provider]  NARCAN 4 MG/0.1ML LIQD nasal spray kit Place 1 spray into the nose once. 08/07/20   [provider]  Nutritional Supplements (FEEDING SUPPLEMENT, OSMOLITE 1.5 CAL,) LIQD Place 237 mLs into feeding tube every 4 (four) hours. 12/12/16   Gladstone Lighter, MD  NYSTATIN powder Apply 1 application topically See admin instructions. Apply to the affected area 2 - 3 times a day. 08/19/20   [provider]  Oxycodone HCl 10 MG TABS Place 10 mg into feeding tube every 4 (four) hours as needed.    [provider]  Oxycodone HCl 20 MG TABS 25 mg by PEG Tube route every 6 (six) hours as needed.     [provider]  polyethylene glycol (MIRALAX / GLYCOLAX) packet Place 17 g into feeding tube daily.    [provider]  scopolamine (TRANSDERM-SCOP) 1 MG/3DAYS Place 1 patch (1.5 mg total) onto the skin every 3 (three) days. 12/14/16   Gladstone Lighter, MD  silver nitrate applicators 36-64 % applicator Two to three sticks every two-three days as needed for granulation tissue. 07/31/19   Fredirick Maudlin, MD  Skin Protectants, Misc. (EUCERIN) cream Apply 1 application topically 2 (two) times daily.    [provider]  sodium phosphate (FLEET) 7-19 GM/118ML ENEM Place 133 mLs (1 enema total) rectally once as needed for severe constipation. 12/12/16   Gladstone Lighter, MD  tamsulosin (FLOMAX)  0.4 MG CAPS capsule Take 0.4 mg by mouth as needed.    [provider]  tiZANidine (ZANAFLEX) 4 MG tablet Place 4 mg into feeding tube every 8 (eight) hours as needed for muscle spasms.    [provider]  venlafaxine (EFFEXOR) 75 MG tablet Take 75 mg by mouth every morning.    [provider]  venlafaxine XR (EFFEXOR-XR) 150 MG 24 hr capsule Take 150 mg by mouth every evening.    [provider]  Water For Irrigation, Sterile (FREE WATER) SOLN Place 200 mLs into feeding tube every 8 (eight) hours. 12/12/16   Gladstone Lighter, MD  zolpidem (AMBIEN) 5 MG tablet Take 5 mg by mouth at bedtime as needed for sleep.    [provider]     Critical care time: 33 minutes    Critical care provider statement:    Critical care time (minutes):  33   Critical care time was exclusive of:  Separately billable procedures and  treating other patients   Critical care was necessary to treat or prevent imminent or  life-threatening deterioration of the following conditions:  Sespsi, UTI, als, severe hyperglycemia   Critical care was time spent personally by me on the following  activities:  Development of treatment plan with patient or surrogate,  discussions with consultants, evaluation of patient's response to  treatment, examination of patient,  obtaining history from patient or  surrogate, ordering and performing treatments and interventions, ordering  and review of laboratory studies and re-evaluation of patient's condition   I assumed direction of critical care for this patient from another  provider in my specialty: no      Ottie Glazier, M.D.  [facility] [place]

## 2020-09-02 NOTE — Plan of Care (Signed)

## 2020-09-02 NOTE — Progress Notes (Addendum)
Shift summary:  - Remains tachycardic.  - Remains on IV insulin infusion.   - Continuous TFs initiated.

## 2020-09-02 NOTE — Consult Note (Addendum)
PHARMACY CONSULT NOTE  Pharmacy Consult for Electrolyte Monitoring and Replacement   Recent Labs: Potassium (mmol/L)  Date Value  09/02/2020 3.1 (L)  01/12/2015 3.8   Magnesium (mg/dL)  Date Value  00/34/9179 2.4   Calcium (mg/dL)  Date Value  15/01/6978 9.4   Calcium, Total (mg/dL)  Date Value  48/10/6551 9.0   Albumin (g/dL)  Date Value  74/82/7078 3.6  01/12/2015 4.1   Phosphorus (mg/dL)  Date Value  67/54/4920 4.1   Sodium (mmol/L)  Date Value  09/02/2020 137  01/12/2015 137    Assessment: Patient is a 44 y/o M with medical history including ALS s/p tracheostomy and PEG tube who is vent dependent who was admitted 12/1 with UTI due to nonfunctioning suprapubic catheter. Patient further with difficult to control blood sugars and requiring an insulin infusion.  Per MAR, insulin infusion most recently running at 23 units/hr and, despite this, patient still with hyperglycemia with BG > 200 mg/dL  Goal of Therapy:  Electrolytes within normal limits  Plan:  --Potassium 4.2 >> 3.1 on re-check. Will order potassium 40 mEq per tube q4h x 2 doses --Patient is noted to be edematous so will try to avoid IV route of administration if possible --Will order potassium and phosphorous re-check for 1800  Tressie Ellis  09/02/2020 11:49 AM

## 2020-09-03 DIAGNOSIS — J9621 Acute and chronic respiratory failure with hypoxia: Secondary | ICD-10-CM | POA: Diagnosis not present

## 2020-09-03 DIAGNOSIS — J9622 Acute and chronic respiratory failure with hypercapnia: Secondary | ICD-10-CM

## 2020-09-03 DIAGNOSIS — A419 Sepsis, unspecified organism: Secondary | ICD-10-CM

## 2020-09-03 DIAGNOSIS — R6520 Severe sepsis without septic shock: Secondary | ICD-10-CM

## 2020-09-03 DIAGNOSIS — N390 Urinary tract infection, site not specified: Secondary | ICD-10-CM

## 2020-09-03 LAB — CBC
HCT: 34.2 % — ABNORMAL LOW (ref 39.0–52.0)
Hemoglobin: 10.9 g/dL — ABNORMAL LOW (ref 13.0–17.0)
MCH: 28.7 pg (ref 26.0–34.0)
MCHC: 31.9 g/dL (ref 30.0–36.0)
MCV: 90 fL (ref 80.0–100.0)
Platelets: 293 10*3/uL (ref 150–400)
RBC: 3.8 MIL/uL — ABNORMAL LOW (ref 4.22–5.81)
RDW: 16.2 % — ABNORMAL HIGH (ref 11.5–15.5)
WBC: 6.9 10*3/uL (ref 4.0–10.5)
nRBC: 0 % (ref 0.0–0.2)

## 2020-09-03 LAB — PHOSPHORUS: Phosphorus: 2.7 mg/dL (ref 2.5–4.6)

## 2020-09-03 LAB — GLUCOSE, CAPILLARY
Glucose-Capillary: 118 mg/dL — ABNORMAL HIGH (ref 70–99)
Glucose-Capillary: 141 mg/dL — ABNORMAL HIGH (ref 70–99)
Glucose-Capillary: 142 mg/dL — ABNORMAL HIGH (ref 70–99)
Glucose-Capillary: 151 mg/dL — ABNORMAL HIGH (ref 70–99)
Glucose-Capillary: 152 mg/dL — ABNORMAL HIGH (ref 70–99)
Glucose-Capillary: 154 mg/dL — ABNORMAL HIGH (ref 70–99)
Glucose-Capillary: 155 mg/dL — ABNORMAL HIGH (ref 70–99)
Glucose-Capillary: 155 mg/dL — ABNORMAL HIGH (ref 70–99)
Glucose-Capillary: 156 mg/dL — ABNORMAL HIGH (ref 70–99)
Glucose-Capillary: 160 mg/dL — ABNORMAL HIGH (ref 70–99)
Glucose-Capillary: 169 mg/dL — ABNORMAL HIGH (ref 70–99)
Glucose-Capillary: 169 mg/dL — ABNORMAL HIGH (ref 70–99)
Glucose-Capillary: 172 mg/dL — ABNORMAL HIGH (ref 70–99)
Glucose-Capillary: 178 mg/dL — ABNORMAL HIGH (ref 70–99)

## 2020-09-03 LAB — MAGNESIUM: Magnesium: 2.2 mg/dL (ref 1.7–2.4)

## 2020-09-03 LAB — BASIC METABOLIC PANEL
Anion gap: 11 (ref 5–15)
BUN: 16 mg/dL (ref 6–20)
CO2: 23 mmol/L (ref 22–32)
Calcium: 8.7 mg/dL — ABNORMAL LOW (ref 8.9–10.3)
Chloride: 107 mmol/L (ref 98–111)
Creatinine, Ser: <0.3 mg/dL — ABNORMAL LOW (ref 0.61–1.24)
Glucose, Bld: 158 mg/dL — ABNORMAL HIGH (ref 70–99)
Potassium: 3.9 mmol/L (ref 3.5–5.1)
Sodium: 141 mmol/L (ref 135–145)

## 2020-09-03 LAB — POTASSIUM: Potassium: 3.9 mmol/L (ref 3.5–5.1)

## 2020-09-03 NOTE — Progress Notes (Signed)
CRITICAL CARE PROGRESS NOTE  NAME:  Randy Roach, MRN:  [id number], DOB:  [birth year], LOS: 2 ADMISSION DATE:  10/01/2020, INITIAL CONSULTATION DATE: 09/06/2020 REFERRING MD: Dr. Joni Fears, CHIEF COMPLAINT: Nonfunctioning chronic suprapubic catheter   Brief History   44 yo male with hx of ALS, chronic tracheostomy and PEG tube who is vent dependent admitted with UTI due to nonfunctioning suprapubic catheter.   History of present illness   This is a 44 yo male who presented to [facility] ER on 12/1 with urinary retention secondary to dysfunctional suprapubic catheter.  Per ER notes caregiver reported the suprapubic catheter appeared to be pulling out further than normal, with urinary drainage around the cathter site on 11/30.    Upon presentation to the ED vitals included: Temperature 99.5 F axillary, RR 18, HR 115, BP 152/108. ER lab results revealed Na+ 132, chloride 97, glucose 273, creatinine <0.30, hgb 12.7, WBC 7.2, lactic acid 1.6, and UA positive for UTI.  His COVID-19 PCR and Influenza PCR are both negative.  In the ER previous suprapubic catheter was removed and replaced with immediate return of 800+ ml of cloudy urine.  Pt also received iv cefepime.  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     Past Medical History  ALS Type II Diabetes Mellitus Chronic PEG Tube  Chronic Tracheostomy  Ventilator Dependent   Significant Hospital Events   09/13/2020: Pt admitted to ICU with UTI secondary to nonfunctioning suprapubic catheter with hx of ALS, chronic tracheostomy, and vent dependent  09/02/20:  Patient in anasarca unresponsive, septic with UTI and background ALS with muscle atrophy. 09/03/20: Remains on ventilator, unresponsive, anasarca noted.  Severe muscle atrophy due to ALS  Consults:  Intensivist   Procedures:  None   Significant Diagnostic Tests:  12/1: UA positive for UTI   Micro Data:  Blood x2  12/1>> Urine 12/1>> polymicrobial Influenza PCR/COVID-19 12/1>>negative Respiratory viral panel 12/1>>  Antimicrobials:  Cefepime 12/1 x1 dose Ceftriaxone 12/1>>  Interim history/subjective:  On ventilator set to home vent settings Hemodynamically stable, no pressors Afebrile   Objective   Blood pressure 140/79, pulse (!) 138, temperature 98.5 F (36.9 C), temperature source Axillary, resp. rate (!) 21, height _0  (1.676 m), weight 79.6 kg, SpO2 96 %.    Vent Mode: SIMV/PC/PS FiO2 (%):  [32 %] 32 % Set Rate:  [12 bmp] 12 bmp Vt Set:  [500 mL] 500 mL PEEP:  [5 cmH20] 5 cmH20 Pressure Support:  [20 cmH20] 20 cmH20   Intake/Output Summary (Last 24 hours) at 09/03/2020 1701 Last data filed at 09/03/2020 1612 Gross per 24 hour  Intake 1848.14 ml  Output 900 ml  Net 948.14 ml   Filed Weights   09/11/2020 0339 09/02/20 0500 09/03/20 0500  Weight: 92.9 kg 79.6 kg 79.6 kg    Examination: General: Acute on chronically ill appearing male, laying in bed, tracheostomy present on vent, unresponsive, synchronous with the vent. 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  Trend WBC and monitor fever curve   Follow micro data as above  Continue ceftriaxone for now   Recollected UA  Severe anasarca  related to severe protein calorie malnutrition and sepsis   PEG dependent for feeds    KUB >>> no obstruction     Feeds/nourishment ongoing     RD on case - glucerna via PEG to goal   ALS - Chronic  Toxic metabolic encephalopathy  patient currently unresponsive   on home regimen of medications due to early withdrawal symptoms  Supportive care    Anemia without obvious acute blood loss  Monitor for S/Sx of bleeding  Trend CBC  Lovenox for VTE Prophylaxis   Transfuse for Hgb <7   Type II diabetes mellitus -with severe uncotrolled hyperglycemia  continue insulin drip   RD has been working on changing nourishment to glucerna  CBG's q1hrs   SSI   Follow ICU Hypo/hyperglycemia protocol   Best practice (evaluated daily)   Diet: Continue Glucerna per RD recommendations  Pain/Anxiety/Delirium protocol (if indicated): Continue outpatient pain regimen  VAP protocol (if indicated): ordered  DVT prophylaxis: subq lovenox  GI prophylaxis: IV pepcid  Glucose control: SSI INSULIN DRIP, transitioning to subcu insulin today Mobility: Bedrest, patient is bed ridden at baseline Last date of multidisciplinary goals of care discussion: 12/2 Family and staff present: No family available for update today Summary of discussion: Admitting to ICU for treatment of UTI, will continue home vent settings Follow up goals of care discussion due 09/02/2020 - done Code Status: DNR Disposition: ICU   Labs   CBC: Recent Labs  Lab 09/03/2020 0342 09/02/20 0011 09/03/20 0403  WBC 7.2 6.7 6.9  NEUTROABS 5.2  --   --   HGB 12.7* 13.0 10.9*  HCT 39.7 40.6 34.2*  MCV 89.6 89.8 90.0  PLT 313 353 885    Basic Metabolic Panel: Recent Labs  Lab 09/18/2020 0342 09/02/20 0011  09/02/20 1056 09/02/20 1745 09/03/20 0403  NA 132* 137  --   --  141  K 4.2 4.2 3.1* 5.2* 3.9  CL 97* 102  --   --  107  CO2 22 21*  --   --  23  GLUCOSE 273* 439*  --   --  158*  BUN 16 13  --   --  16  CREATININE <0.30* <0.30*  --   --  <0.30*  CALCIUM 9.6 9.4  --   --  8.7*  MG  --  2.4  --   --  2.2  PHOS  --  4.1  --  2.9 2.7   GFR: CrCl cannot be calculated (This lab value cannot be used to calculate CrCl because it is not a number: <0.30). Recent Labs  Lab 09/27/2020 0342 09/02/20 0011 09/03/20 0403  WBC 7.2 6.7 6.9  LATICACIDVEN 1.6 1.6  --     Liver Function Tests: Recent Labs  Lab 09/12/2020 0342  AST 41  ALT 46*  ALKPHOS 112  BILITOT 0.8  PROT 7.7  ALBUMIN 3.6   No results for input(s): LIPASE, AMYLASE in the last 168 hours. No results for input(s):  AMMONIA in the last 168 hours.  ABG    Component Value Date/Time   PHART 7.52 (H) 05/10/2016 0430   PCO2ART 45 05/10/2016 0430   PO2ART 101 05/10/2016 0430   HCO3 36.7 (H) 05/10/2016 0430   O2SAT 98.4 05/10/2016 0430     Coagulation Profile: Recent Labs  Lab 09/17/2020 0342  INR 0.9    Cardiac Enzymes: No results for input(s): CKTOTAL, CKMB, CKMBINDEX, TROPONINI in the last 168 hours.  HbA1C: Hemoglobin A1C  Date/Time Value Ref Range Status  10/25/2011 08:05 AM 14.5 (H) 4.2 - 6.3 % Final    Comment:    The American Diabetes Association recommends that a primary goal of therapy should be <7% and that physicians should reevaluate the treatment regimen in patients with HbA1c values consistently >8%.    Hgb A1c MFr Bld  Date/Time Value Ref Range Status  09/02/2020 10:56 AM 8.4 (H) 4.8 - 5.6 % Final    Comment:    (NOTE) Pre diabetes:          5.7%-6.4%  Diabetes:              >6.4%  Glycemic control for   <7.0% adults with diabetes   09/05/2020 03:42 AM 8.5 (H) 4.8 - 5.6 % Final    Comment:    (NOTE) Pre diabetes:          5.7%-6.4%  Diabetes:              >6.4%  Glycemic control  for   <7.0% adults with diabetes     CBG: Recent Labs  Lab 09/03/20 1011 09/03/20 1101 09/03/20 1203 09/03/20 1303 09/03/20 1505  GLUCAP 118* 155* 142* 141* 154*    Review of Systems:   Unable to assess due to chronic vent, trach, and nonverbal due to ALS   Past Medical History  He,  has a past medical history of ALS (amyotrophic lateral sclerosis) ([place]), Diabetes mellitus without complication ([place]), and Respiratory failure, chronic ([place]).   Surgical History    Past Surgical History:  Procedure Laterality Date  . APPENDECTOMY    . fractured hands    . PEG PLACEMENT N/A 04/19/2017   Procedure: PERCUTANEOUS ENDOSCOPIC GASTROSTOMY (PEG) REPLACEMENT;  Surgeon: Jonathon Bellows, MD;  Location: [facility] ENDOSCOPY;  Service: Endoscopy;  Laterality: N/A;  . PEG PLACEMENT N/A 04/19/2017   Procedure: PERCUTANEOUS ENDOSCOPIC GASTROSTOMY (PEG) PLACEMENT;  Surgeon: Jonathon Bellows, MD;  Location[facility] ENDOSCOPY;  Service: Gastroenterology;  Laterality: N/A;  . PEG TUBE PLACEMENT Left   . TRACHEOSTOMY       Social History   reports that he quit smoking about 4 years ago. His smoking use included cigarettes. He smoked 1.00 pack per day. He has never used smokeless tobacco. He reports that he does not drink alcohol and does not use drugs.   Family History   His family history includes ALS in his father; Heart disease in his mother.   Allergies Allergies  Allergen Reactions  . Debrox [Carbamide Peroxide]   . Latex     Scheduled Meds: . busPIRone  10 mg Per Tube TID  . chlorhexidine gluconate (MEDLINE KIT)  15 mL Mouth Rinse BID  . Chlorhexidine Gluconate Cloth  6 each Topical Daily  . dornase alpha  2.5 mg Nebulization Daily  . enoxaparin (LOVENOX) injection  40 mg Subcutaneous Q24H  . feeding supplement (GLUCERNA 1.5 CAL)  1,425 mL Per Tube Q24H  . free water  30 mL Per Tube Q4H  . hydrocerin  1 application Topical BID  . mouth rinse  15 mL Mouth Rinse 10 times per day  . polyethylene  glycol  17 g Per Tube Daily  . scopolamine  1 patch Transdermal Q72H  . sodium chloride flush  10-40 mL Intracatheter Q12H  . venlafaxine XR  150 mg Oral QPM   Continuous Infusions: . sodium chloride 75 mL/hr at 09/03/20 1612  . cefTRIAXone (ROCEPHIN)  IV Stopped (09/03/20 1334)  . famotidine (PEPCID) IV 20 mg (09/03/20 8251)  . insulin 14 mL/hr at 09/03/20 1612   PRN Meds:.acetaminophen, baclofen, bisacodyl, dextrose, diazepam, ipratropium-albuterol, morphine injection, ondansetron (ZOFRAN) IV, oxyCODONE, oxyCODONE, sodium chloride flush, sodium phosphate, tamsulosin, tiZANidine, zolpidem    Critical care time: 33 minutes       C. Derrill Kay, MD [place] [facility]   *This note was dictated using voice recognition software/Dragon.  Despite best efforts to proofread, errors can occur which can change the meaning.  Any change was purely unintentional.

## 2020-09-03 NOTE — Progress Notes (Signed)
Inpatient Diabetes Program Recommendations  AACE/ADA: New Consensus Statement on Inpatient Glycemic Control   Target Ranges:  Prepandial:   less than 140 mg/dL      Peak postprandial:   less than 180 mg/dL (1-2 hours)      Critically ill patients:  140 - 180 mg/dL   Results for Randy Roach, Randy Roach (MRN [id number]) as of 09/03/2020 07:55  Ref. Range 09/03/2020 00:28 09/03/2020 01:31 09/03/2020 03:48 09/03/2020 05:56 09/03/2020 07:30  Glucose-Capillary Latest Ref Range: 70 - 99 mg/dL 151 (H)  14 units/hr 155 (H)  15 units/hr 156 (H)  15 units/hr 152 (H)  14 units/hr 160 (H)  16 units/hr   Review of Glycemic Control  Diabetes history: DM2 Outpatient Diabetes medications: NPH 28 units BID Current orders for Inpatient glycemic control: IV insulin; Glucerna @ 75 ml/hr  Inpatient Diabetes Program Recommendations:   Insulin: Due to high drip rates on IV insulin, would recommend to continue with IV insulin per ICU Glycemic Control Phase 2 order set and once criteria met, use ICU Phase 3 to transition.     Thanks, Barnie Alderman, RN, MSN, CDE Diabetes Coordinator Inpatient Diabetes Program [phone] (Team Pager from 8am to 5pm)

## 2020-09-03 NOTE — Consult Note (Signed)
PHARMACY CONSULT NOTE  Pharmacy Consult for Electrolyte Monitoring and Replacement   Recent Labs: Potassium (mmol/L)  Date Value  09/03/2020 3.9  01/12/2015 3.8   Magnesium (mg/dL)  Date Value  50/35/4656 2.2   Calcium (mg/dL)  Date Value  81/27/5170 8.7 (L)   Calcium, Total (mg/dL)  Date Value  01/74/9449 9.0   Albumin (g/dL)  Date Value  67/59/1638 3.6  01/12/2015 4.1   Phosphorus (mg/dL)  Date Value  46/65/9935 2.7   Sodium (mmol/L)  Date Value  09/03/2020 141  01/12/2015 137    Assessment: Patient is a 44 y/o M with medical history including ALS s/p tracheostomy and PEG tube who is vent dependent who was admitted 12/1 with UTI due to nonfunctioning suprapubic catheter. Patient further with difficult to control blood sugars and requiring an insulin infusion.  Nutrition: Glucerna 1.5 at 75 mL/hr x 19h + free water flushes 30 mL q4h  MIVF: NS at 75 mL/hr  Insulin infusion down to 14 units/hr today. Blood glucose control improved.   Goal of Therapy:  Electrolytes within normal limits  Plan:  --Potassium 4.2 >> 3.1 >> 5.2 (after 80 mEq enteral potassium) >> 3.9 --No electrolyte replacement warranted at this time --Will re-check electrolytes tomorrow AM  Tressie Ellis 09/03/2020 8:21 AM

## 2020-09-04 DIAGNOSIS — Z930 Tracheostomy status: Secondary | ICD-10-CM

## 2020-09-04 DIAGNOSIS — J9610 Chronic respiratory failure, unspecified whether with hypoxia or hypercapnia: Secondary | ICD-10-CM

## 2020-09-04 DIAGNOSIS — Z9911 Dependence on respirator [ventilator] status: Secondary | ICD-10-CM

## 2020-09-04 DIAGNOSIS — G1221 Amyotrophic lateral sclerosis: Secondary | ICD-10-CM | POA: Diagnosis not present

## 2020-09-04 DIAGNOSIS — R6520 Severe sepsis without septic shock: Secondary | ICD-10-CM | POA: Diagnosis not present

## 2020-09-04 DIAGNOSIS — A419 Sepsis, unspecified organism: Secondary | ICD-10-CM | POA: Diagnosis not present

## 2020-09-04 LAB — CBC
HCT: 34.3 % — ABNORMAL LOW (ref 39.0–52.0)
Hemoglobin: 11.1 g/dL — ABNORMAL LOW (ref 13.0–17.0)
MCH: 28.8 pg (ref 26.0–34.0)
MCHC: 32.4 g/dL (ref 30.0–36.0)
MCV: 88.9 fL (ref 80.0–100.0)
Platelets: 300 10*3/uL (ref 150–400)
RBC: 3.86 MIL/uL — ABNORMAL LOW (ref 4.22–5.81)
RDW: 15.9 % — ABNORMAL HIGH (ref 11.5–15.5)
WBC: 8.5 10*3/uL (ref 4.0–10.5)
nRBC: 0 % (ref 0.0–0.2)

## 2020-09-04 LAB — GLUCOSE, CAPILLARY
Glucose-Capillary: 120 mg/dL — ABNORMAL HIGH (ref 70–99)
Glucose-Capillary: 128 mg/dL — ABNORMAL HIGH (ref 70–99)
Glucose-Capillary: 129 mg/dL — ABNORMAL HIGH (ref 70–99)
Glucose-Capillary: 130 mg/dL — ABNORMAL HIGH (ref 70–99)
Glucose-Capillary: 131 mg/dL — ABNORMAL HIGH (ref 70–99)
Glucose-Capillary: 131 mg/dL — ABNORMAL HIGH (ref 70–99)
Glucose-Capillary: 134 mg/dL — ABNORMAL HIGH (ref 70–99)
Glucose-Capillary: 137 mg/dL — ABNORMAL HIGH (ref 70–99)
Glucose-Capillary: 138 mg/dL — ABNORMAL HIGH (ref 70–99)
Glucose-Capillary: 142 mg/dL — ABNORMAL HIGH (ref 70–99)
Glucose-Capillary: 143 mg/dL — ABNORMAL HIGH (ref 70–99)
Glucose-Capillary: 144 mg/dL — ABNORMAL HIGH (ref 70–99)
Glucose-Capillary: 145 mg/dL — ABNORMAL HIGH (ref 70–99)
Glucose-Capillary: 145 mg/dL — ABNORMAL HIGH (ref 70–99)
Glucose-Capillary: 146 mg/dL — ABNORMAL HIGH (ref 70–99)
Glucose-Capillary: 163 mg/dL — ABNORMAL HIGH (ref 70–99)
Glucose-Capillary: 167 mg/dL — ABNORMAL HIGH (ref 70–99)
Glucose-Capillary: 169 mg/dL — ABNORMAL HIGH (ref 70–99)
Glucose-Capillary: 169 mg/dL — ABNORMAL HIGH (ref 70–99)
Glucose-Capillary: 171 mg/dL — ABNORMAL HIGH (ref 70–99)
Glucose-Capillary: 183 mg/dL — ABNORMAL HIGH (ref 70–99)
Glucose-Capillary: 194 mg/dL — ABNORMAL HIGH (ref 70–99)

## 2020-09-04 LAB — BASIC METABOLIC PANEL
Anion gap: 13 (ref 5–15)
BUN: 16 mg/dL (ref 6–20)
CO2: 26 mmol/L (ref 22–32)
Calcium: 9.1 mg/dL (ref 8.9–10.3)
Chloride: 100 mmol/L (ref 98–111)
Creatinine, Ser: <0.3 mg/dL — ABNORMAL LOW (ref 0.61–1.24)
Glucose, Bld: 143 mg/dL — ABNORMAL HIGH (ref 70–99)
Potassium: 4 mmol/L (ref 3.5–5.1)
Sodium: 139 mmol/L (ref 135–145)

## 2020-09-04 LAB — CULTURE, RESPIRATORY W GRAM STAIN: Culture: NORMAL

## 2020-09-04 LAB — PHOSPHORUS: Phosphorus: 3.5 mg/dL (ref 2.5–4.6)

## 2020-09-04 LAB — MAGNESIUM: Magnesium: 2.1 mg/dL (ref 1.7–2.4)

## 2020-09-04 MED ORDER — FAMOTIDINE 20 MG PO TABS: 20.0000 mg | ORAL_TABLET | Freq: Every day | ORAL | Status: DC

## 2020-09-04 MED ORDER — FAMOTIDINE 20 MG PO TABS
20.0000 mg | ORAL_TABLET | Freq: Every day | ORAL | Status: DC
  Administered 2020-09-04 – 2020-09-06 (×3): 20 mg
  Filled 2020-09-04 (×3): qty 1

## 2020-09-04 MED ORDER — FREE WATER
100.0000 mL | Status: DC
  Administered 2020-09-04 – 2020-09-07 (×17): 100 mL

## 2020-09-04 NOTE — Consult Note (Signed)
PHARMACY CONSULT NOTE  Pharmacy Consult for Electrolyte Monitoring and Replacement   Recent Labs: Potassium (mmol/L)  Date Value  09/04/2020 4.0  01/12/2015 3.8   Magnesium (mg/dL)  Date Value  46/80/3212 2.1   Calcium (mg/dL)  Date Value  24/82/5003 9.1   Calcium, Total (mg/dL)  Date Value  70/48/8891 9.0   Albumin (g/dL)  Date Value  69/45/0388 3.6  01/12/2015 4.1   Phosphorus (mg/dL)  Date Value  82/80/0349 3.5   Sodium (mmol/L)  Date Value  09/04/2020 139  01/12/2015 137    Assessment: Patient is a 44 y/o M with medical history including ALS s/p tracheostomy and PEG tube who is vent dependent who was admitted 12/1 with UTI due to nonfunctioning suprapubic catheter. Patient further with difficult to control blood sugars and requiring an insulin infusion.  Nutrition: Glucerna 1.5 at 75 mL/hr x 19h + free water flushes 30 mL q4h  MIVF: NS at 75 mL/hr  Insulin infusion down to 14 units/hr today. Blood glucose control improved.   Goal of Therapy:  Electrolytes within normal limits  Plan:  --Potassium maintaining for now despite insulin drip --No electrolyte replacement warranted at this time --Will re-check electrolytes tomorrow AM  Pricilla Riffle, PharmD 09/04/2020 9:22 AM

## 2020-09-04 NOTE — Progress Notes (Signed)
CRITICAL CARE PROGRESS NOTE  NAME:  Randy Roach, MRN:  [id number], DOB:  1976/07/26, LOS: 3 ADMISSION DATE:  09/24/2020, INITIAL CONSULTATION DATE: 09/14/2020 REFERRING MD: Dr. Joni Fears, CHIEF COMPLAINT: Nonfunctioning chronic suprapubic catheter   Brief History   44 yo male with hx of ALS, chronic tracheostomy and PEG tube who is vent dependent admitted with UTI due to nonfunctioning suprapubic catheter.   Background  This is a 44 yo male who presented to [facility] ER on 12/1 with urinary retention secondary to dysfunctional suprapubic catheter.  Per ER notes caregiver reported the suprapubic catheter appeared to be pulling out further than normal, with urinary drainage around the cathter site on 11/30.    Upon presentation to the ED vitals included: Temperature 99.5 F axillary, RR 18, HR 115, BP 152/108. ER lab results revealed Na+ 132, chloride 97, glucose 273, creatinine <0.30, hgb 12.7, WBC 7.2, lactic acid 1.6, and UA positive for UTI.  His COVID-19 PCR and Influenza PCR are both negative.  In the ER previous suprapubic catheter was removed and replaced with immediate return of 800+ ml of cloudy urine.  Pt also received iv cefepime.  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     Past Medical History  ALS Type II Diabetes Mellitus Chronic PEG Tube  Chronic Tracheostomy  Ventilator Dependent   Significant Hospital Events   09/11/2020: Pt admitted to ICU with UTI secondary to nonfunctioning suprapubic catheter with hx of ALS, chronic tracheostomy, and vent dependent  09/02/20:  Patient in anasarca unresponsive, septic with UTI and background ALS with muscle atrophy. 09/03/20: Remains on ventilator, unresponsive, anasarca noted.  Severe muscle atrophy due to ALS 09/04/20: On the ventilator, unresponsive (does not do eye blink or eye tracking that he normally does)  Consults:  Intensivist   Procedures:  None    Significant Diagnostic Tests:  12/1: UA positive for UTI   Micro Data:  Blood x2 12/1>> no growth to date Urine 12/1>> polymicrobial Influenza PCR/COVID-19 12/1>>negative Respiratory viral panel 12/1>> negative  Antimicrobials:  Cefepime 12/1 x1 dose Ceftriaxone 12/1>>  Interim history/subjective:  On hospital ventilator set to home vent settings Hemodynamically stable, no pressors Afebrile   Objective   Blood pressure 125/87, pulse (!) 104, temperature 99.3 F (37.4 C), temperature source Axillary, resp. rate 18, height 5' 6"  (1.676 m), weight 79.7 kg, SpO2 99 %.    Vent Mode: SIMV;PSV FiO2 (%):  [32 %] 32 % Set Rate:  [12 bmp] 12 bmp Vt Set:  [500 mL] 500 mL PEEP:  [5 cmH20] 5 cmH20 Pressure Support:  [20 cmH20] 20 cmH20   Intake/Output Summary (Last 24 hours) at 09/04/2020 0925 Last data filed at 09/04/2020 0601 Gross per 24 hour  Intake 3049.4 ml  Output 1200 ml  Net 1849.4 ml   Filed Weights   09/02/20 0500 09/03/20 0500 09/04/20 0148  Weight: 79.6 kg 79.6 kg 79.7 kg    Examination: General: Acute on chronically ill appearing male, laying in bed, tracheostomy present on vent, unresponsive, synchronous with the vent. 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  Trend WBC and monitor fever curve   Follow micro data as above  Continue ceftriaxone for now   Recollected UA due to polymicrobial growth  Severe anasarca  related to severe protein calorie malnutrition and sepsis  Receiving nutrition via PEG   PEG dependent for feeds    KUB >>> no obstruction     Feeds/nourishment ongoing     RD on case - glucerna via PEG to goal   ALS - Chronic  Toxic metabolic encephalopathy  patient currently unresponsive   on home regimen of medications due to early withdrawal symptoms  Supportive care   Anemia without obvious acute blood loss  Monitor for S/Sx of bleeding  Trend CBC  Lovenox for VTE Prophylaxis   Transfuse for Hgb <7   Type II diabetes mellitus -with severe uncotrolled hyperglycemia  continue insulin drip   RD has been working on changing nourishment to glucerna  CBG's q1hrs   SSI   Follow ICU Hypo/hyperglycemia protocol   Best practice (evaluated daily)   Diet: Continue Glucerna per RD recommendations, free water increased to 187m every 6h Pain/Anxiety/Delirium protocol (if indicated): Continue outpatient pain regimen  VAP protocol (if indicated): ordered  DVT prophylaxis: subq lovenox  GI prophylaxis: IV pepcid, change to via PEG today Glucose control: SSI INSULIN DRIP, transitioning to subcu insulin today Mobility: Bedrest, patient is bed ridden at baseline Last date of multidisciplinary goals of care discussion: 12/2 Family and staff present: No family available for update today Summary of discussion: Admitting to ICU for treatment of UTI, will continue home vent settings Follow up goals of care discussion due 09/02/2020 - done Code Status: DNR Disposition: ICU   Labs   CBC: Recent Labs   Lab 09/11/2020 0342 09/02/20 0011 09/03/20 0403 09/04/20 0448  WBC 7.2 6.7 6.9 8.5  NEUTROABS 5.2  --   --   --   HGB 12.7* 13.0 10.9* 11.1*  HCT 39.7 40.6 34.2* 34.3*  MCV 89.6 89.8 90.0 88.9  PLT 313 353 293 3916   Basic Metabolic Panel: Recent Labs  Lab 09/02/2020 0342 09/10/2020 0342 09/02/20 0011 09/02/20 0011 09/02/20 1056 09/02/20 1745 09/03/20 0403 09/03/20 2304 09/04/20 0448  NA 132*  --  137  --   --   --  141  --  139  K 4.2   < > 4.2   < > 3.1* 5.2* 3.9 3.9 4.0  CL 97*  --  102  --   --   --  107  --  100  CO2 22  --  21*  --   --   --  23  --  26  GLUCOSE 273*  --  439*  --   --   --  158*  --  143*  BUN 16  --  13  --   --   --  16  --  16  CREATININE <0.30*  --  <0.30*  --   --   --  <0.30*  --  <0.30*  CALCIUM 9.6  --  9.4  --   --   --  8.7*  --  9.1  MG  --   --  2.4  --   --   --  2.2  --  2.1  PHOS  --   --  4.1  --   --  2.9 2.7  --  3.5   < > = values in this interval not displayed.   GFR: CrCl cannot be calculated (This lab value cannot be used to calculate CrCl because it is not a number: <0.30). Recent Labs  Lab 09/16/2020 0342 09/02/20 0011 09/03/20 0403 09/04/20 0448  WBC 7.2 6.7 6.9 8.5  LATICACIDVEN 1.6 1.6  --   --     Liver Function Tests: Recent Labs  Lab 09/23/2020 0342  AST 41  ALT 46*  ALKPHOS 112  BILITOT 0.8  PROT 7.7  ALBUMIN 3.6   No results for input(s): LIPASE, AMYLASE in the last 168 hours.   Coagulation Profile: Recent Labs  Lab 09/28/2020 0342  INR 0.9    Cardiac Enzymes: No results for input(s): CKTOTAL, CKMB, CKMBINDEX, TROPONINI in the last 168 hours.  HbA1C: Hemoglobin A1C  Date/Time Value Ref Range Status  10/25/2011 08:05 AM 14.5 (H) 4.2 - 6.3 % Final    Comment:    The American Diabetes Association recommends that a primary goal of therapy should be <7% and that physicians should reevaluate the treatment regimen in patients with HbA1c values consistently >8%.    Hgb A1c MFr Bld  Date/Time  Value Ref Range Status  09/02/2020 10:56 AM 8.4 (H) 4.8 - 5.6 % Final    Comment:    (NOTE) Pre diabetes:          5.7%-6.4%  Diabetes:              >6.4%  Glycemic control for   <7.0% adults with diabetes   09/19/2020 03:42 AM 8.5 (H) 4.8 - 5.6 % Final    Comment:    (NOTE) Pre diabetes:          5.7%-6.4%  Diabetes:              >6.4%  Glycemic control for   <7.0% adults with diabetes     CBG: Recent Labs  Lab 09/04/20 0441 09/04/20 0529 09/04/20 0701 09/04/20 0804 09/04/20 0852  GLUCAP 138* 145* 143* 131* 120*    Review of Systems:   Unable to assess due to chronic vent, trach, and nonverbal due to ALS   Past Medical History   Past Medical History:  Diagnosis Date  . ALS (amyotrophic lateral sclerosis) ([place])   . Diabetes mellitus without complication ([place])   . Respiratory failure, chronic [facility])    Surgical History    Past Surgical History:  Procedure Laterality Date  . APPENDECTOMY    . fractured hands    . PEG PLACEMENT N/A 04/19/2017   Procedure: PERCUTANEOUS ENDOSCOPIC GASTROSTOMY (PEG) REPLACEMENT;  Surgeon: Jonathon Bellows, MD;  Location: [facility] ENDOSCOPY;  Service: Endoscopy;  Laterality: N/A;  . PEG PLACEMENT N/A 04/19/2017   Procedure: PERCUTANEOUS ENDOSCOPIC GASTROSTOMY (PEG) PLACEMENT;  Surgeon: Jonathon Bellows, MD;  Location: [facility] ENDOSCOPY;  Service: Gastroenterology;  Laterality: N/A;  . PEG TUBE PLACEMENT Left   . TRACHEOSTOMY      Social History   Social History   Tobacco Use  . Smoking status: Former Smoker    Packs/day: 1.00    Types: Cigarettes    Quit date: 10/14/2015  Years since quitting: 4.8  . Smokeless tobacco: Never Used  Substance Use Topics  . Alcohol use: No    Comment: occ   Family History    Family History  Problem Relation Age of Onset  . Heart disease Mother   . ALS Father    Allergies Allergies  Allergen Reactions  . Debrox [Carbamide Peroxide]   . Latex     Scheduled Meds: . busPIRone  10 mg Per Tube TID   . chlorhexidine gluconate (MEDLINE KIT)  15 mL Mouth Rinse BID  . Chlorhexidine Gluconate Cloth  6 each Topical Daily  . dornase alpha  2.5 mg Nebulization Daily  . enoxaparin (LOVENOX) injection  40 mg Subcutaneous Q24H  . feeding supplement (GLUCERNA 1.5 CAL)  1,425 mL Per Tube Q24H  . free water  30 mL Per Tube Q4H  . hydrocerin  1 application Topical BID  . mouth rinse  15 mL Mouth Rinse 10 times per day  . polyethylene glycol  17 g Per Tube Daily  . scopolamine  1 patch Transdermal Q72H  . sodium chloride flush  10-40 mL Intracatheter Q12H  . venlafaxine XR  150 mg Oral QPM   Continuous Infusions: . cefTRIAXone (ROCEPHIN)  IV Stopped (09/03/20 1334)  . famotidine (PEPCID) IV 20 mg (09/04/20 0601)  . insulin 10.5 mL/hr at 09/04/20 0601   PRN Meds:.acetaminophen, baclofen, bisacodyl, dextrose, diazepam, ipratropium-albuterol, morphine injection, ondansetron (ZOFRAN) IV, oxyCODONE, oxyCODONE, sodium chloride flush, sodium phosphate, tamsulosin, tiZANidine, zolpidem    Critical care time:35 minutes    Prognosis overall is very poor due to underlying ALS.   Renold Don, MD [place] [facility]   *This note was dictated using voice recognition software/Dragon.  Despite best efforts to proofread, errors can occur which can change the meaning.  Any change was purely unintentional.

## 2020-09-04 NOTE — Progress Notes (Signed)
Inpatient Diabetes Program Recommendations  AACE/ADA: New Consensus Statement on Inpatient Glycemic Control (2015)  Target Ranges:  Prepandial:   less than 140 mg/dL      Peak postprandial:   less than 180 mg/dL (1-2 hours)      Critically ill patients:  140 - 180 mg/dL   Lab Results  Component Value Date   GLUCAP 120 (H) 09/04/2020   HGBA1C 8.4 (H) 09/02/2020    Review of Glycemic Control Results for Randy Roach, Randy Roach (MRN [id number]) as of 09/04/2020 09:09  Ref. Range 09/04/2020 07:01 09/04/2020 08:04 09/04/2020 08:52  Glucose-Capillary Latest Ref Range: 70 - 99 mg/dL 546 (H) 270 (H) 350 (H)   Diabetes history: DM2 Outpatient Diabetes medications: NPH 28 units BID Current orders for Inpatient glycemic control: IV insulin; Glucerna @ 75 ml/hr  Inpatient Diabetes Program Recommendations:   Drip rates on IV insulin beginning to trend down < 7 units/hr.   When ready to transition consider: -Levemir 32 units two hours prior to discontinuation, then BID to follow -Novolog 7 units Q4H (for tube feed coverage, to be stopped or held in the event tube feeds are stopped) -Novolog 2-6 units Q4H.  Thanks, Lujean Rave, MSN, RNC-OB Diabetes Coordinator [phone] (8a-5p)

## 2020-09-05 DIAGNOSIS — R6520 Severe sepsis without septic shock: Secondary | ICD-10-CM | POA: Diagnosis not present

## 2020-09-05 DIAGNOSIS — N390 Urinary tract infection, site not specified: Secondary | ICD-10-CM | POA: Diagnosis not present

## 2020-09-05 DIAGNOSIS — A419 Sepsis, unspecified organism: Secondary | ICD-10-CM | POA: Diagnosis not present

## 2020-09-05 LAB — GLUCOSE, CAPILLARY
Glucose-Capillary: 161 mg/dL — ABNORMAL HIGH (ref 70–99)
Glucose-Capillary: 163 mg/dL — ABNORMAL HIGH (ref 70–99)
Glucose-Capillary: 174 mg/dL — ABNORMAL HIGH (ref 70–99)
Glucose-Capillary: 174 mg/dL — ABNORMAL HIGH (ref 70–99)
Glucose-Capillary: 176 mg/dL — ABNORMAL HIGH (ref 70–99)
Glucose-Capillary: 180 mg/dL — ABNORMAL HIGH (ref 70–99)
Glucose-Capillary: 182 mg/dL — ABNORMAL HIGH (ref 70–99)
Glucose-Capillary: 185 mg/dL — ABNORMAL HIGH (ref 70–99)
Glucose-Capillary: 186 mg/dL — ABNORMAL HIGH (ref 70–99)
Glucose-Capillary: 194 mg/dL — ABNORMAL HIGH (ref 70–99)
Glucose-Capillary: 195 mg/dL — ABNORMAL HIGH (ref 70–99)
Glucose-Capillary: 204 mg/dL — ABNORMAL HIGH (ref 70–99)
Glucose-Capillary: 204 mg/dL — ABNORMAL HIGH (ref 70–99)
Glucose-Capillary: 205 mg/dL — ABNORMAL HIGH (ref 70–99)
Glucose-Capillary: 258 mg/dL — ABNORMAL HIGH (ref 70–99)
Glucose-Capillary: 297 mg/dL — ABNORMAL HIGH (ref 70–99)

## 2020-09-05 LAB — BASIC METABOLIC PANEL
Anion gap: 12 (ref 5–15)
BUN: 15 mg/dL (ref 6–20)
CO2: 26 mmol/L (ref 22–32)
Calcium: 9.1 mg/dL (ref 8.9–10.3)
Chloride: 95 mmol/L — ABNORMAL LOW (ref 98–111)
Creatinine, Ser: <0.3 mg/dL — ABNORMAL LOW (ref 0.61–1.24)
Glucose, Bld: 198 mg/dL — ABNORMAL HIGH (ref 70–99)
Potassium: 5.4 mmol/L — ABNORMAL HIGH (ref 3.5–5.1)
Sodium: 133 mmol/L — ABNORMAL LOW (ref 135–145)

## 2020-09-05 LAB — URINE CULTURE: Culture: 60000 — AB

## 2020-09-05 LAB — CBC
HCT: 34.3 % — ABNORMAL LOW (ref 39.0–52.0)
Hemoglobin: 11.5 g/dL — ABNORMAL LOW (ref 13.0–17.0)
MCH: 28.7 pg (ref 26.0–34.0)
MCHC: 33.5 g/dL (ref 30.0–36.0)
MCV: 85.5 fL (ref 80.0–100.0)
Platelets: 309 10*3/uL (ref 150–400)
RBC: 4.01 MIL/uL — ABNORMAL LOW (ref 4.22–5.81)
RDW: 15.2 % (ref 11.5–15.5)
WBC: 11.3 10*3/uL — ABNORMAL HIGH (ref 4.0–10.5)
nRBC: 0 % (ref 0.0–0.2)

## 2020-09-05 LAB — MAGNESIUM: Magnesium: 1.8 mg/dL (ref 1.7–2.4)

## 2020-09-05 LAB — PHOSPHORUS: Phosphorus: 4.2 mg/dL (ref 2.5–4.6)

## 2020-09-05 MED ORDER — INSULIN ASPART 100 UNIT/ML ~~LOC~~ SOLN
7.0000 [IU] | SUBCUTANEOUS | Status: DC
  Administered 2020-09-05 – 2020-09-07 (×11): 7 [IU] via SUBCUTANEOUS
  Filled 2020-09-05 (×11): qty 1

## 2020-09-05 MED ORDER — FLUCONAZOLE IN SODIUM CHLORIDE 200-0.9 MG/100ML-% IV SOLN
200.0000 mg | INTRAVENOUS | Status: DC
  Administered 2020-09-05: 200 mg via INTRAVENOUS
  Filled 2020-09-05 (×2): qty 100

## 2020-09-05 MED ORDER — INSULIN DETEMIR 100 UNIT/ML ~~LOC~~ SOLN
32.0000 [IU] | Freq: Two times a day (BID) | SUBCUTANEOUS | Status: DC
  Administered 2020-09-05 – 2020-09-06 (×4): 32 [IU] via SUBCUTANEOUS
  Filled 2020-09-05 (×7): qty 0.32

## 2020-09-05 MED ORDER — METOPROLOL TARTRATE 5 MG/5ML IV SOLN
INTRAVENOUS | Status: AC
  Administered 2020-09-05: 2.5 mg via INTRAVENOUS
  Filled 2020-09-05: qty 5

## 2020-09-05 MED ORDER — METOPROLOL TARTRATE 5 MG/5ML IV SOLN: 2.5000 mg | INTRAVENOUS | Status: AC

## 2020-09-05 MED ORDER — INSULIN ASPART 100 UNIT/ML ~~LOC~~ SOLN
0.0000 [IU] | SUBCUTANEOUS | Status: DC
  Administered 2020-09-05 (×2): 3 [IU] via SUBCUTANEOUS
  Administered 2020-09-06 (×2): 4 [IU] via SUBCUTANEOUS
  Administered 2020-09-06: 3 [IU] via SUBCUTANEOUS
  Administered 2020-09-06: 4 [IU] via SUBCUTANEOUS
  Administered 2020-09-06: 3 [IU] via SUBCUTANEOUS
  Administered 2020-09-06: 4 [IU] via SUBCUTANEOUS
  Administered 2020-09-06: 5 [IU] via SUBCUTANEOUS
  Administered 2020-09-07: 3 [IU] via SUBCUTANEOUS
  Administered 2020-09-07: 4 [IU] via SUBCUTANEOUS
  Filled 2020-09-05 (×10): qty 1

## 2020-09-05 MED ORDER — METOPROLOL TARTRATE 25 MG/10 ML ORAL SUSPENSION
25.0000 mg | Freq: Two times a day (BID) | ORAL | Status: DC
  Administered 2020-09-05 (×2): 25 mg
  Filled 2020-09-05 (×3): qty 10

## 2020-09-05 MED ORDER — SODIUM CHLORIDE 0.9 % IV SOLN
1.0000 g | Freq: Three times a day (TID) | INTRAVENOUS | Status: DC
  Administered 2020-09-05 – 2020-09-06 (×2): 1 g via INTRAVENOUS
  Filled 2020-09-05 (×4): qty 1

## 2020-09-05 NOTE — Progress Notes (Signed)
Pharmacy Antimicrobial Note  RIK WADEL is a 44 y.o. male admitted on 2020-09-30 with UTI.  Pharmacy has been consulted for Fluconazole dosing.  Plan: Fluconazole 200mg  IV q24h  Height: 5\' 6"  (167.6 cm) Weight: 80.1 kg (176 lb 9.4 oz) IBW/kg (Calculated) : 63.8  Temp (24hrs), Avg:99.4 F (37.4 C), Min:98.5 F (36.9 C), Max:101 F (38.3 C)  Recent Labs  Lab 09-30-20 0342 09/02/20 0011 09/03/20 0403 09/04/20 0448 09/05/20 0743  WBC 7.2 6.7 6.9 8.5 11.3*  CREATININE <0.30* <0.30* <0.30* <0.30* <0.30*  LATICACIDVEN 1.6 1.6  --   --   --     CrCl cannot be calculated (This lab value cannot be used to calculate CrCl because it is not a number: <0.30).    Allergies  Allergen Reactions  . Debrox [Carbamide Peroxide]   . Latex     Antimicrobials this admission: Cefepime 12/1 x 1 dose Ceftriaxone 12/1 >> 12/5 Meropenem 12/5 >> Fluconazole 12/5 >>  Dose adjustments this admission:  Microbiology results: 12/1 BCx: 1/4 GPR 12/1 UCx: multiple species   Thank you for allowing pharmacy to be a part of this patient's care.  14/1, PharmD, BCPS 09/05/2020 6:24 PM

## 2020-09-05 NOTE — Progress Notes (Signed)
CRITICAL CARE PROGRESS NOTE  NAME:  Randy Roach, MRN:  [id number], DOB:  10-05-1975, LOS: 4 ADMISSION DATE:  09/12/2020, INITIAL CONSULTATION DATE: 09/06/2020 REFERRING MD: Dr. Joni Fears, CHIEF COMPLAINT: Nonfunctioning chronic suprapubic catheter   Brief History   44 yo male with hx of ALS, chronic tracheostomy and PEG tube who is vent dependent admitted with UTI due to nonfunctioning suprapubic catheter.   Background  This is a 44 yo male who presented to [facility] ER on 12/1 with urinary retention secondary to dysfunctional suprapubic catheter.  Per ER notes caregiver reported the suprapubic catheter appeared to be pulling out further than normal, with urinary drainage around the cathter site on 11/30.    Upon presentation to the ED vitals included: Temperature 99.5 F axillary, RR 18, HR 115, BP 152/108. ER lab results revealed Na+ 132, chloride 97, glucose 273, creatinine <0.30, hgb 12.7, WBC 7.2, lactic acid 1.6, and UA positive for UTI.  His COVID-19 PCR and Influenza PCR are both negative.  In the ER previous suprapubic catheter was removed and replaced with immediate return of 800+ ml of cloudy urine.  Pt also received iv cefepime.  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     Past Medical History  ALS Type II Diabetes Mellitus Chronic PEG Tube  Chronic Tracheostomy  Ventilator Dependent   Significant Hospital Events   09/24/2020: Pt admitted to ICU with UTI secondary to nonfunctioning suprapubic catheter with hx of ALS, chronic tracheostomy, and vent dependent  09/02/20:  Patient in anasarca unresponsive, septic with UTI and background ALS with muscle atrophy. 09/03/20: Remains on ventilator, unresponsive, anasarca noted.  Severe muscle atrophy due to ALS 09/04/20: On the ventilator, unresponsive (does not do eye blink or eye tracking that he normally does) 09/05/20: On ventilator, opens eyes, no tracking.  No  eye blinking.  Persistent anasarca  Consults:  Intensivist   Procedures:  None   Significant Diagnostic Tests:  12/1: UA positive for UTI   Micro Data:  Blood x2 12/1>> no growth to date Urine 12/1>> polymicrobial Influenza PCR/COVID-19 12/1>>negative Respiratory viral panel 12/1>> negative  Antimicrobials:  Cefepime 12/1 x1 dose Ceftriaxone 12/1>> 12/5 Meropenem 12/5>> Fluconazole 12/5>> Interim history/subjective:  On hospital ventilator set to home vent settings Tachycardic but otherwise hemodynamically stable, no pressors Afebrile   Objective   Blood pressure 133/76, pulse (!) 131, temperature 98.6 F (37 C), temperature source Oral, resp. rate (!) 23, height 5' 6"  (1.676 m), weight 80.1 kg, SpO2 99 %.    Vent Mode: SIMV;PSV FiO2 (%):  [32 %-45 %] 44 % Set Rate:  [12 bmp] 12 bmp Vt Set:  [500 mL] 500 mL PEEP:  [5 cmH20] 5 cmH20 Pressure Support:  [20 cmH20] 20 cmH20   Intake/Output Summary (Last 24 hours) at 09/05/2020 0929 Last data filed at 09/05/2020 3532 Gross per 24 hour  Intake 2704.76 ml  Output 2150 ml  Net 554.76 ml   Filed Weights   09/03/20 0500 09/04/20 0148 09/05/20 0317  Weight: 79.6 kg 79.7 kg 80.1 kg    Examination: General: Acute on chronically ill appearing male, laying in bed, tracheostomy present on vent, unresponsive, synchronous with the vent. HENT: Atraumatic, normocephalic, neck supple, tracheostomy in place  Lungs: Diffuse rhonchi on auscultation, vent assisted, even Cardiovascular: Tachycardia, regular rhythm, s1s2, no M/R/G, 1+ distal pulses Abdomen: Obese, soft,nondistended, no guarding or rebound tenderness, PEG tube in place Extremities: upper extremities 2+ edema,  muscle wasting due to ALS Neuro: Opens eyes but no blinking or tracking.  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 and  vent dependant   Continue home ventilator settings, currently on hospital vent  Maintain O2 sats > 90 %  Follow intermittent CXR & ABG as needed  VAP Protocol  Continue PRNbronchodilator therapy  Continue airway hygiene  Tachycardia  Due to dysautonomia from ALS  Aggravated by pain  Pain control  Not a beta-blocker  Sepsis    present on admission   due to UTI- multi species frankly purulent urine from chronic suprapubic catheter  Urine positive for yeast, begin fluconazole  Positive blood culture organism not identified switch to meropenem  Trend WBC and monitor fever curve, WBCs elevated today  Follow micro data as above   Severe anasarca  related to severe protein calorie malnutrition and sepsis  Receiving nutrition via PEG   PEG dependent for feeds    KUB >>> no obstruction     Feeds/nourishment ongoing     RD on case - glucerna via PEG to goal   ALS - Chronic  Toxic metabolic encephalopathy  patient currently unresponsive   on home regimen of medications due to early withdrawal symptoms  Supportive care   Anemia without obvious acute blood loss  Monitor for S/Sx of bleeding  Trend CBC  Lovenox for VTE Prophylaxis   Transfuse for Hgb <7   Type II diabetes mellitus -with severe uncotrolled hyperglycemia  Transition to Levemir and NovoLog  RD has been working on changing nourishment to glucerna  CBG's q 4 hours  SSI   Follow ICU Hypo/hyperglycemia protocol   Best practice (evaluated daily)   Diet: Continue Glucerna per RD recommendations, free water increased to 161m every 6h Pain/Anxiety/Delirium protocol (if indicated): Continue outpatient pain regimen  VAP protocol (if indicated): ordered  DVT prophylaxis: subq lovenox  GI prophylaxis: IV pepcid, change to via PEG today Glucose control: SSI, transitioning to subcu insulin today Mobility: Bedrest, patient is bed ridden at baseline Last date of multidisciplinary  goals of care discussion: 12/2 Family and staff present: No family available for update today Summary of discussion: Admitting to ICU for treatment of UTI, will continue home vent settings Follow up goals of care discussion due 09/02/2020 - done Code Status: DNR Disposition: ICU   Labs   CBC: Recent Labs  Lab 09/02/2020 0342 09/02/20 0011 09/03/20 0403 09/04/20 0448 09/05/20 0743  WBC 7.2 6.7 6.9 8.5 11.3*  NEUTROABS 5.2  --   --   --   --   HGB 12.7* 13.0 10.9* 11.1* 11.5*  HCT 39.7 40.6 34.2* 34.3* 34.3*  MCV 89.6 89.8 90.0 88.9 85.5  PLT 313 353 293 300 3295   Basic Metabolic Panel: Recent Labs  Lab 09/28/2020 0342 09/26/2020 0342 09/02/20 0011 09/02/20 1056 09/02/20 1745 09/03/20 0403 09/03/20 2304 09/04/20 0448 09/05/20 0743  NA 132*  --  137  --   --  141  --  139 133*  K 4.2   < > 4.2   < > 5.2* 3.9 3.9 4.0 5.4*  CL 97*  --  102  --   --  107  --  100 95*  CO2 22  --  21*  --   --  23  --  26 26  GLUCOSE 273*  --  439*  --   --  158*  --  143* 198*  BUN 16  --  13  --   --  16  --  16 15  CREATININE <0.30*  --  <0.30*  --   --  <0.30*  --  <0.30* <0.30*  CALCIUM 9.6  --  9.4  --   --  8.7*  --  9.1 9.1  MG  --   --  2.4  --   --  2.2  --  2.1 1.8  PHOS  --   --  4.1  --  2.9 2.7  --  3.5 4.2   < > = values in this interval not displayed.   GFR: CrCl cannot be calculated (This lab value cannot be used to calculate CrCl because it is not a number: <0.30). Recent Labs  Lab 09/18/2020 0342 09/02/2020 0342 09/02/20 0011 09/03/20 0403 09/04/20 0448 09/05/20 0743  WBC 7.2   < > 6.7 6.9 8.5 11.3*  LATICACIDVEN 1.6  --  1.6  --   --   --    < > = values in this interval not displayed.    Liver Function Tests: Recent Labs  Lab 09/02/2020 0342  AST 41  ALT 46*  ALKPHOS 112  BILITOT 0.8  PROT 7.7  ALBUMIN 3.6   No results for input(s): LIPASE, AMYLASE in the last 168 hours.   Coagulation Profile: Recent Labs  Lab 09/28/2020 0342  INR 0.9    Cardiac  Enzymes: No results for input(s): CKTOTAL, CKMB, CKMBINDEX, TROPONINI in the last 168 hours.  HbA1C: Hemoglobin A1C  Date/Time Value Ref Range Status  10/25/2011 08:05 AM 14.5 (H) 4.2 - 6.3 % Final    Comment:    The American Diabetes Association recommends that a primary goal of therapy should be <7% and that physicians should reevaluate the treatment regimen in patients with HbA1c values consistently >8%.    Hgb A1c MFr Bld  Date/Time Value Ref Range Status  09/02/2020 10:56 AM 8.4 (H) 4.8 - 5.6 % Final    Comment:    (NOTE) Pre diabetes:          5.7%-6.4%  Diabetes:              >6.4%  Glycemic control for   <7.0% adults with diabetes   09/06/2020 03:42 AM 8.5 (H) 4.8 - 5.6 % Final    Comment:    (NOTE) Pre diabetes:          5.7%-6.4%  Diabetes:              >6.4%  Glycemic control for   <7.0% adults with diabetes     CBG: Recent Labs  Lab 09/05/20 0428 09/05/20 0534 09/05/20 0628 09/05/20 0817 09/05/20 0912  GLUCAP 174* 204* 204* 186* 163*    Review of Systems:   Unable to assess due to chronic vent, trach, and nonverbal due to ALS   Past Medical History   Past Medical History:  Diagnosis Date  . 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 Diabetes mellitus without complication ([place])   . Respiratory failure, chronic [facility])    Surgical History    Past Surgical History:  Procedure Laterality Date  . APPENDECTOMY    . fractured hands    . PEG PLACEMENT N/A 04/19/2017   Procedure: PERCUTANEOUS ENDOSCOPIC GASTROSTOMY (PEG) REPLACEMENT;  Surgeon: Jonathon Bellows, MD;  Location: [facility] ENDOSCOPY;  Service: Endoscopy;  Laterality: N/A;  . PEG PLACEMENT N/A 04/19/2017   Procedure: PERCUTANEOUS ENDOSCOPIC GASTROSTOMY (  PEG) PLACEMENT;  Surgeon: Jonathon Bellows, MD;  Location: [facility] ENDOSCOPY;  Service: Gastroenterology;  Laterality: N/A;  . PEG TUBE PLACEMENT Left   . TRACHEOSTOMY      Social History   Social History   Tobacco Use  . Smoking status: Former  Smoker    Packs/day: 1.00    Types: Cigarettes    Quit date: 10/14/2015    Years since quitting: 4.8  . Smokeless tobacco: Never Used  Substance Use Topics  . Alcohol use: No    Comment: occ   Family History    Family History  Problem Relation Age of Onset  . Heart disease Mother   . ALS Father    Allergies Allergies  Allergen Reactions  . Debrox [Carbamide Peroxide]   . Latex     Scheduled Meds: . busPIRone  10 mg Per Tube TID  . chlorhexidine gluconate (MEDLINE KIT)  15 mL Mouth Rinse BID  . Chlorhexidine Gluconate Cloth  6 each Topical Daily  . enoxaparin (LOVENOX) injection  40 mg Subcutaneous Q24H  . famotidine  20 mg Per Tube Daily  . feeding supplement (GLUCERNA 1.5 CAL)  1,425 mL Per Tube Q24H  . free water  100 mL Per Tube Q4H  . hydrocerin  1 application Topical BID  . mouth rinse  15 mL Mouth Rinse 10 times per day  . polyethylene glycol  17 g Per Tube Daily  . sodium chloride flush  10-40 mL Intracatheter Q12H  . venlafaxine XR  150 mg Oral QPM   Continuous Infusions: . cefTRIAXone (ROCEPHIN)  IV Stopped (09/04/20 1244)  . insulin 13 Units/hr (09/05/20 2595)   PRN Meds:.acetaminophen, baclofen, bisacodyl, dextrose, diazepam, ipratropium-albuterol, morphine injection, ondansetron (ZOFRAN) IV, oxyCODONE, oxyCODONE, sodium chloride flush, sodium phosphate, tamsulosin, tiZANidine, zolpidem    Critical care time:40 minutes    Prognosis overall is very poor due to underlying ALS.   Renold Don, MD [facility]   *This note was dictated using voice recognition software/Dragon.  Despite best efforts to proofread, errors can occur which can change the meaning.  Any change was purely unintentional.

## 2020-09-05 NOTE — Consult Note (Signed)
PHARMACY CONSULT NOTE  Pharmacy Consult for Electrolyte Monitoring and Replacement   Recent Labs: Potassium (mmol/L)  Date Value  09/05/2020 5.4 (H)  01/12/2015 3.8   Magnesium (mg/dL)  Date Value  74/16/3845 1.8   Calcium (mg/dL)  Date Value  36/46/8032 9.1   Calcium, Total (mg/dL)  Date Value  09/24/8249 9.0   Albumin (g/dL)  Date Value  03/70/4888 3.6  01/12/2015 4.1   Phosphorus (mg/dL)  Date Value  91/69/4503 4.2   Sodium (mmol/L)  Date Value  09/05/2020 133 (L)  01/12/2015 137    Assessment: Patient is a 44 y/o M with medical history including ALS s/p tracheostomy and PEG tube who is vent dependent who was admitted 12/1 with UTI due to nonfunctioning suprapubic catheter. Patient requiring an insulin infusion for glucose control now transitioning to SQ insulin regimen.  Nutrition: Glucerna 1.5 at 75 mL/hr x 19h + free water flushes 30 mL q4h  MIVF: NS at 75 mL/hr   Goal of Therapy:  Electrolytes within normal limits  Plan:  Potassium slightly elevated this morning. Patient now transitioning off the insulin drip. Will follow up labs in the morning.  Pricilla Riffle, PharmD 09/05/2020 2:12 PM

## 2020-09-06 DIAGNOSIS — A419 Sepsis, unspecified organism: Secondary | ICD-10-CM | POA: Diagnosis not present

## 2020-09-06 DIAGNOSIS — R6521 Severe sepsis with septic shock: Secondary | ICD-10-CM

## 2020-09-06 LAB — BASIC METABOLIC PANEL
Anion gap: 12 (ref 5–15)
BUN: 18 mg/dL (ref 6–20)
CO2: 28 mmol/L (ref 22–32)
Calcium: 9.6 mg/dL (ref 8.9–10.3)
Chloride: 95 mmol/L — ABNORMAL LOW (ref 98–111)
Creatinine, Ser: <0.3 mg/dL — ABNORMAL LOW (ref 0.61–1.24)
Glucose, Bld: 313 mg/dL — ABNORMAL HIGH (ref 70–99)
Potassium: 3.9 mmol/L (ref 3.5–5.1)
Sodium: 135 mmol/L (ref 135–145)

## 2020-09-06 LAB — CULTURE, BLOOD (ROUTINE X 2): Culture: NO GROWTH

## 2020-09-06 LAB — GLUCOSE, CAPILLARY
Glucose-Capillary: 256 mg/dL — ABNORMAL HIGH (ref 70–99)
Glucose-Capillary: 256 mg/dL — ABNORMAL HIGH (ref 70–99)
Glucose-Capillary: 303 mg/dL — ABNORMAL HIGH (ref 70–99)
Glucose-Capillary: 306 mg/dL — ABNORMAL HIGH (ref 70–99)
Glucose-Capillary: 325 mg/dL — ABNORMAL HIGH (ref 70–99)
Glucose-Capillary: 340 mg/dL — ABNORMAL HIGH (ref 70–99)
Glucose-Capillary: 355 mg/dL — ABNORMAL HIGH (ref 70–99)

## 2020-09-06 LAB — CBC
HCT: 37.2 % — ABNORMAL LOW (ref 39.0–52.0)
Hemoglobin: 12.2 g/dL — ABNORMAL LOW (ref 13.0–17.0)
MCH: 28.6 pg (ref 26.0–34.0)
MCHC: 32.8 g/dL (ref 30.0–36.0)
MCV: 87.1 fL (ref 80.0–100.0)
Platelets: 362 10*3/uL (ref 150–400)
RBC: 4.27 MIL/uL (ref 4.22–5.81)
RDW: 15.4 % (ref 11.5–15.5)
WBC: 11 10*3/uL — ABNORMAL HIGH (ref 4.0–10.5)
nRBC: 0 % (ref 0.0–0.2)

## 2020-09-06 LAB — MAGNESIUM: Magnesium: 2.2 mg/dL (ref 1.7–2.4)

## 2020-09-06 LAB — PHOSPHORUS: Phosphorus: 4.8 mg/dL — ABNORMAL HIGH (ref 2.5–4.6)

## 2020-09-06 MED ORDER — METOPROLOL TARTRATE 25 MG PO TABS
25.0000 mg | ORAL_TABLET | Freq: Three times a day (TID) | ORAL | Status: DC
  Filled 2020-09-06: qty 1

## 2020-09-06 MED ORDER — CIPROFLOXACIN HCL 500 MG PO TABS
500.0000 mg | ORAL_TABLET | Freq: Two times a day (BID) | ORAL | Status: DC
  Administered 2020-09-06 – 2020-09-07 (×2): 500 mg
  Filled 2020-09-06 (×3): qty 1

## 2020-09-06 MED ORDER — METOPROLOL TARTRATE 5 MG/5ML IV SOLN
INTRAVENOUS | Status: AC
  Administered 2020-09-06: 5 mg via INTRAVENOUS
  Filled 2020-09-06: qty 5

## 2020-09-06 MED ORDER — METOPROLOL TARTRATE 5 MG/5ML IV SOLN
5.0000 mg | Freq: Once | INTRAVENOUS | Status: AC
  Administered 2020-09-06: 5 mg via INTRAVENOUS

## 2020-09-06 NOTE — Progress Notes (Signed)
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   Background  This is a 44 yo male who presented to [facility] ER on 12/1 with urinary retention secondary to dysfunctional suprapubic catheter.  Per ER notes caregiver reported the suprapubic catheter appeared to be pulling out further than normal, with urinary drainage around the cathter site on 11/30.    Upon presentation to the ED vitals included: Temperature 99.5 F axillary, RR 18, HR 115, BP 152/108. ER lab results revealed Na+ 132, chloride 97, glucose 273, creatinine <0.30, hgb 12.7, WBC 7.2, lactic acid 1.6, and UA positive for UTI.  His COVID-19 PCR and Influenza PCR are both negative.  In the ER previous suprapubic catheter was removed and replaced with immediate return of 800+ ml of cloudy urine.  Pt also received iv cefepime.  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   SEPSIS PRESENT ON ADMISSION  22-Sep-2020: Pt admitted to ICU with UTI secondary to nonfunctioning suprapubic catheter with hx of ALS, chronic tracheostomy, and vent dependent  09/02/20:  Patient in anasarca unresponsive, septic with UTI and background ALS with muscle atrophy. 09/03/20: Remains on ventilator, unresponsive, anasarca noted.  Severe muscle atrophy due to ALS 09/04/20: On the ventilator, unresponsive (does not do eye blink or eye tracking that he normally does) 09/05/20: On ventilator, opens eyes, no tracking.  No eye blinking.  Persistent anasarca    CC  follow up sepsis  SUBJECTIVE Patient remains critically ill Prognosis is guarded   BP 120/69   Pulse (!) 138   Temp (!) 96.8 F (36 C) (Axillary)   Resp 18   Ht 5' 5.98" (1.676 m)   Wt 81.6 kg   SpO2 99%   BMI 29.05 kg/m    I/O last 3 completed shifts: In: 4793.9 [I.V.:236; Other:150;  NG/GT:3905.3; IV Piggyback:502.5] Out: 5375 [Urine:5325; Drains:50] No intake/output data recorded.  SpO2: 99 % O2 Flow Rate (L/min): 6 L/min FiO2 (%): 36 %  Estimated body mass index is 29.05 kg/m as calculated from the following:   Height as of this encounter: 5' 5.98" (1.676 m).   Weight as of this encounter: 81.6 kg.  SIGNIFICANT EVENTS   REVIEW OF SYSTEMS  PATIENT IS UNABLE TO PROVIDE COMPLETE REVIEW OF SYSTEMS DUE TO SEVERE CRITICAL ILLNESS        PHYSICAL EXAMINATION:  GENERAL:critically ill appearing,  HEAD: Normocephalic, atraumatic.  EYES: Pupils equal, round, reactive to light.  No scleral icterus.  MOUTH: Moist mucosal membrane. NECK: Supple.  PULMONARY: +rhonchi, +wheezing CARDIOVASCULAR: S1 and S2. Regular rate and rhythm. No murmurs, rubs, or gallops.  GASTROINTESTINAL: Soft, nontender, -distended.  Positive bowel sounds.   MUSCULOSKELETAL: + edema.  NEUROLOGIC: +quadraplegia SKIN:intact,warm,dry  MEDICATIONS: I have reviewed all medications and confirmed regimen as documented   CULTURE RESULTS   Recent Results (from the past 240 hour(s))  Blood Culture (routine x 2)     Status: None (Preliminary result)   Collection Time: 09-22-2020  3:42 AM   Specimen: BLOOD  Result Value Ref Range Status   Specimen Description BLOOD RIGHT HAND  Final   Special Requests   Final    BOTTLES DRAWN AEROBIC AND ANAEROBIC Blood Culture results may not be optimal due to an inadequate volume of blood received in culture bottles   Culture  Final    NO GROWTH 4 DAYS Performed at [facility], [address] [place], [place] [address]    Report Status PENDING  Incomplete  Blood Culture (routine x 2)     Status: None (Preliminary result)   Collection Time: 09-22-2020  3:42 AM   Specimen: BLOOD  Result Value Ref Range Status   Specimen Description   Final    BLOOD LEFT FA Performed at [facility], [address], [place], [place] [address]     Special Requests   Final    BOTTLES DRAWN AEROBIC AND ANAEROBIC Blood Culture adequate volume Performed at [facility], [address] [place], [place] [address]    Culture  Setup Time   Final    AEROBIC BOTTLE ONLY GRAM POSITIVE RODS CRITICAL RESULT CALLED TO, READ BACK BY AND VERIFIED WITH: LISA KLUTTZ 09/04/20 AT 2137 BY ACR    Culture GRAM POSITIVE RODS  Final   Report Status PENDING  Incomplete  Urine culture     Status: Abnormal   Collection Time: 2020/09/22  3:42 AM   Specimen: In/Out Cath Urine  Result Value Ref Range Status   Specimen Description   Final    IN/OUT CATH URINE Performed at [facility], [address], [place], [place] [address]    Special Requests   Final    NONE Performed at [facility], [address] [place], [place] [address]    Culture MULTIPLE SPECIES PRESENT, SUGGEST RECOLLECTION (A)  Final   Report Status 09/02/2020 FINAL  Final  Resp Panel by RT-PCR (Flu A&B, Covid) Nasopharyngeal Swab     Status: None   Collection Time: 2020-09-22  6:39 AM   Specimen: Nasopharyngeal Swab; Nasopharyngeal(NP) swabs in vial transport medium  Result Value Ref Range Status   SARS Coronavirus 2 by RT PCR NEGATIVE NEGATIVE Final    Comment: (NOTE) SARS-CoV-2 target nucleic acids are NOT DETECTED.  The SARS-CoV-2 RNA is generally detectable in upper respiratory specimens during the acute phase of infection. The lowest concentration of SARS-CoV-2 viral copies this assay can detect is 138 copies/mL. A negative result does not preclude SARS-Cov-2 infection and should not be used as the sole basis for treatment or other patient management decisions. A negative result may occur with  improper specimen collection/handling, submission of specimen other than nasopharyngeal swab, presence of viral mutation(s) within the areas targeted by this assay, and inadequate number of viral copies(<138 copies/mL). A negative result must be combined  with clinical observations, patient history, and epidemiological information. The expected result is Negative.  Fact Sheet for Patients:  BloggerCourse.com  Fact Sheet for Healthcare Providers:  SeriousBroker.it  This test is no t yet approved or cleared by the [place] FDA and  has been authorized for detection and/or diagnosis of SARS-CoV-2 by FDA under an Emergency Use Authorization (EUA). This EUA will remain  in effect (meaning this test can be used) for the duration of the COVID-19 declaration under Section 564(b)(1) of the Act, 21 U.S.C.section 360bbb-3(b)(1), unless the authorization is terminated  or revoked sooner.       Influenza A by PCR NEGATIVE NEGATIVE Final   Influenza B by PCR NEGATIVE NEGATIVE Final    Comment: (NOTE) The Xpert Xpress SARS-CoV-2/FLU/RSV plus assay is intended as an aid in the diagnosis of influenza from Nasopharyngeal swab specimens and should not be used as a sole basis for treatment. Nasal washings and aspirates are unacceptable for Xpert Xpress SARS-CoV-2/FLU/RSV testing.  Fact  Sheet for Patients: BloggerCourse.com  Fact Sheet for Healthcare Providers: SeriousBroker.it  This test is not yet approved or cleared by the [place] FDA and has been authorized for detection and/or diagnosis of SARS-CoV-2 by FDA under an Emergency Use Authorization (EUA). This EUA will remain in effect (meaning this test can be used) for the duration of the COVID-19 declaration under Section 564(b)(1) of the Act, 21 U.S.C. section 360bbb-3(b)(1), unless the authorization is terminated or revoked.  Performed at [facility], [address] [place], [place] [address]   Respiratory Panel by PCR     Status: None   Collection Time: 09/22/20 11:45 AM  Result Value Ref Range Status   Adenovirus NOT DETECTED NOT DETECTED Final   Coronavirus 229E  NOT DETECTED NOT DETECTED Final    Comment: (NOTE) The Coronavirus on the Respiratory Panel, DOES NOT test for the novel  Coronavirus (2019 nCoV)    Coronavirus HKU1 NOT DETECTED NOT DETECTED Final   Coronavirus NL63 NOT DETECTED NOT DETECTED Final   Coronavirus OC43 NOT DETECTED NOT DETECTED Final   Metapneumovirus NOT DETECTED NOT DETECTED Final   Rhinovirus / Enterovirus NOT DETECTED NOT DETECTED Final   Influenza A NOT DETECTED NOT DETECTED Final   Influenza B NOT DETECTED NOT DETECTED Final   Parainfluenza Virus 1 NOT DETECTED NOT DETECTED Final   Parainfluenza Virus 2 NOT DETECTED NOT DETECTED Final   Parainfluenza Virus 3 NOT DETECTED NOT DETECTED Final   Parainfluenza Virus 4 NOT DETECTED NOT DETECTED Final   Respiratory Syncytial Virus NOT DETECTED NOT DETECTED Final   Bordetella pertussis NOT DETECTED NOT DETECTED Final   Chlamydophila pneumoniae NOT DETECTED NOT DETECTED Final   Mycoplasma pneumoniae NOT DETECTED NOT DETECTED Final    Comment: Performed at [facility], [address] [place], [place] [address]  MRSA PCR Screening     Status: None   Collection Time: 2020-09-22  4:20 PM   Specimen: Nasal Mucosa; Nasopharyngeal  Result Value Ref Range Status   MRSA by PCR NEGATIVE NEGATIVE Final    Comment:        The GeneXpert MRSA Assay (FDA approved for NASAL specimens only), is one component of a comprehensive MRSA colonization surveillance program. It is not intended to diagnose MRSA infection nor to guide or monitor treatment for MRSA infections. Performed at [facility], [address] [place], [place] [address]   Culture, respiratory     Status: None   Collection Time: 09/02/20  1:20 PM   Specimen: Trachea; Respiratory  Result Value Ref Range Status   Specimen Description   Final    TRACHEAL SITE Performed at [facility], [address], [place], [place] [address]    Special Requests   Final    NONE Performed at [facility], [address] [place], [place] [address]    Gram Stain   Final    ABUNDANT WBC PRESENT,BOTH PMN AND MONONUCLEAR FEW GRAM VARIABLE ROD    Culture   Final    Normal respiratory flora-no Staph aureus or Pseudomonas seen Performed at [facility], [address], [place], [place] [address]    Report Status 09/04/2020 FINAL  Final  Urine Culture     Status: Abnormal   Collection Time: 09/03/20  7:50 PM   Specimen: Urine, Random  Result Value Ref Range Status   Specimen Description   Final    URINE, RANDOM Performed at [facility], [address]  [place], [place] [address]    Special Requests   Final    NONE Performed at [facility], [address] [place], [place] [address]    Culture 60,000 COLONIES/mL YEAST (A)  Final   Report Status 09/05/2020 FINAL  Final          IMAGING    No results found.   Nutrition Status: Nutrition Problem: Inadequate oral intake Etiology: inability to eat Signs/Symptoms: NPO status (reliance on tube feeds/free water via G-tube to meet calorie/protein/hydration needs) Interventions: Tube feeding     Indwelling Urinary Catheter continued, requirement due to   Reason to continue Indwelling Urinary Catheter strict Intake/Output monitoring for hemodynamic instability   Central Line/ continued, requirement due to  Reason to continue [employer] Line Monitoring of central venous pressure or other hemodynamic parameters and poor IV access   Ventilator continued, requirement due to severe respiratory failure       ASSESSMENT AND PLAN SYNOPSIS    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   Will certainly impact respiratory mechanics, h/o ACLS    NEUROLOGY Acute toxic metabolic encephalopathy  SHOCK-SEPSIS -use vasopressors to keep MAP>65 as needed -follow up cultures -emperic ABX -consider stress dose steroids -aggressive IV fluid resuscitation  due to UTI- multi species frankly purulent urine from chronic suprapubic catheter  Urine positive for yeast, begin fluconazole Positive blood culture organism not identified switch to meropenem    CARDIAC ICU monitoring  ID -continue IV abx as prescibed -follow up cultures  GI GI PROPHYLAXIS as indicated  NUTRITIONAL STATUS Nutrition Status: Nutrition Problem: Inadequate oral intake Etiology: inability to eat Signs/Symptoms: NPO status (reliance on tube feeds/free water via G-tube to meet calorie/protein/hydration needs) Interventions: Tube feeding   DIET-->TF's as tolerated Constipation protocol as indicated  ENDO - will use ICU hypoglycemic\Hyperglycemia protocol if indicated     ELECTROLYTES -follow labs as needed -replace as needed -pharmacy consultation and following   DVT/GI PRX ordered and assessed TRANSFUSIONS AS NEEDED MONITOR FSBS I Assessed the need for Labs I Assessed the need for Foley I Assessed the need for Central Venous Line Family Discussion when available I Assessed the need for Mobilization I made an Assessment of medications to be adjusted accordingly Safety Risk assessment completed   CASE DISCUSSED IN MULTIDISCIPLINARY ROUNDS WITH ICU TEAM  Critical Care Time devoted to patient care services described in this note is 34 minutes.   Overall, patient is critically ill, prognosis is guarded.  Lucie LeatherKurian David Roy Tokarz, M.D.  [facility]  Medical Director [facility] Medical Director [facility] Cardio-Pulmonary Department

## 2020-09-06 NOTE — TOC Initial Note (Signed)
Transition of Care [facility]) - Initial/Assessment Note    Patient Details  Name: Randy Roach MRN: [id number] Date of Birth: [birth year]  [facility]) CM/SW Contact:    Marina Goodell Phone Number: [phone] 09/06/2020, 11:20 AM  Clinical Narrative:                  CSW spoke with patient's main care taker Althea Charon (Aunt) [phone].  CSW explained role of TOC inpatient care.  CSW asked about current home health services and possible d/c in the near future.  Ms. Ladona Ridgel stated the patient is current with [facility] and receives care 12 per day.  Patient is full care.  Ms. Ladona Ridgel stated she was going to visit the patient today and would like to speak with Dr. Belia Heman.  CSW updated Dr. Belia Heman on Ms. Taylor's request. CSW will contact [facility], once I receive confirmation patient is ready for d/c.  Expected Discharge Plan: Home w Home Health Services (Active w/ [facility]) Barriers to Discharge: Continued Medical Work up   Patient Goals and CMS Choice        Expected Discharge Plan and Services Expected Discharge Plan: Home w Home Health Services (Active w/ [facility]) In-house Referral: Clinical Social Work   Post Acute Care Choice: Home Health (Active w/ [facility]) Living arrangements for the past 2 months: Mobile Home                                      Prior Living Arrangements/Services Living arrangements for the past 2 months: Mobile Home Lives with:: Relatives Althea Charon (Aunt) [phone])   Do you feel safe going back to the place where you live?: Yes      Need for Family Participation in Patient Care: Yes (Comment) Care giver support system in place?: Yes (comment) Current home services: Home RN, Homehealth aide, Housekeeping, Comptroller, DME Criminal Activity/Legal Involvement Pertinent to Current Situation/Hospitalization: No - Comment as needed  Activities of Daily Living      Permission Sought/Granted Permission sought to share  information with : Family Supports Permission granted to share information with :  Althea Charon (Aunt) [phone])  Share Information with NAME: Leanora Cover) [phone]  Permission granted to share info w AGENCY: [facility]        Emotional Assessment Appearance:: Appears stated age Attitude/Demeanor/Rapport: Unable to Assess Affect (typically observed): Unable to Assess   Alcohol / Substance Use: Not Applicable Psych Involvement: No (comment)  Admission diagnosis:  Quadriplegia (HCC) [G82.50] UTI (urinary tract infection) [N39.0] 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 UTI (urinary tract infection) 09/14/2020  . Diabetes mellitus, type 2 (HCC) 10/14/2019  . S/P percutaneous endoscopic gastrostomy (PEG) tube placement (HCC) 09/11/2019  . Hypertrophic granulation tissue 07/24/2019  . Medication management 04/19/2019  . Sputum culture positive for Pseudomonas 10/26/2017  . Skin irritation 03/18/2017  . Chronic neuromuscular respiratory failure (HCC) 02/23/2017  . Drug-induced constipation 01/15/2017  . Left lower lobe pneumonia 12/08/2016  . Hyperglycemia 12/08/2016  . Anemia 12/05/2016  . Anxiety and depression 12/05/2016  . Sepsis due to pneumonia (HCC) 05/08/2016  . Malnutrition compromising bodily function (HCC) 03/09/2016  . 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 Pain 03/09/2016  . Motor  neuron disease (HCC) 09/16/2015  . Oropharyngeal dysphagia 08/20/2015   PCP:  Charlyne Petrin, MD Pharmacy:   [facility] [address]), [place] - [address] [place]) [place] [address] Phone: [phone] Fax: [phone]     Social Determinants of Health (SDOH)  Interventions    Readmission Risk Interventions No flowsheet data found.

## 2020-09-06 NOTE — Progress Notes (Signed)
Inpatient Diabetes Program Recommendations  AACE/ADA: New Consensus Statement on Inpatient Glycemic Control (2015)  Target Ranges:  Prepandial:   less than 140 mg/dL      Peak postprandial:   less than 180 mg/dL (1-2 hours)      Critically ill patients:  140 - 180 mg/dL   Lab Results  Component Value Date   GLUCAP 303 (H) 09/06/2020   HGBA1C 8.4 (H) 09/02/2020    Review of Glycemic Control Results for JAJA, SWITALSKI (MRN [id number]) as of 09/06/2020 06:36  Ref. Range 09/05/2020 13:08 09/05/2020 15:36 09/05/2020 19:36 09/05/2020 23:58 09/06/2020 03:41  Glucose-Capillary Latest Ref Range: 70 - 99 mg/dL 106 (H) 269 (H) 485 (H) 256 (H) 303 (H)   Diabetes history:DM2 Outpatient Diabetes medications:NPH 28 units BID Current orders for Inpatient glycemic control:I Levemir 32 units bid Novolog 0-6 units q4h Novolog 7 units q4h Glucerna @ 75 ml/hr  Inpatient Diabetes Program Recommendations:     CBG's are trending up since transition off of IV insulin yesterday.  Might consider:  -Novolog 10 units q4h tube feed coverage (stop if feeds are held or discontinued). -Novolog 0-15 unit q4h  Will continue to follow while inpatient.  Thank you, Dulce Sellar, RN, BSN Diabetes Coordinator Inpatient Diabetes Program [phone] (team pager from 8a-5p)

## 2020-09-06 NOTE — Consult Note (Signed)
PHARMACY CONSULT NOTE  Pharmacy Consult for Electrolyte Monitoring and Replacement   Recent Labs: Potassium (mmol/L)  Date Value  09/06/2020 3.9  01/12/2015 3.8   Magnesium (mg/dL)  Date Value  12/21/2246 2.2   Calcium (mg/dL)  Date Value  25/00/3704 9.6   Calcium, Total (mg/dL)  Date Value  88/89/1694 9.0   Albumin (g/dL)  Date Value  50/38/8828 3.6  01/12/2015 4.1   Phosphorus (mg/dL)  Date Value  00/34/9179 4.8 (H)   Sodium (mmol/L)  Date Value  09/06/2020 135  01/12/2015 137    Assessment: Patient is a 44 y/o M with medical history including ALS s/p tracheostomy and PEG tube who is vent dependent who was admitted 12/1 with UTI due to nonfunctioning suprapubic catheter. Patient requiring an insulin infusion for glucose control now transitioning to SQ insulin regimen.  Nutrition: Glucerna 1.5 at 75 mL/hr x 19h + free water flushes 100 mL q4h  Goal of Therapy:  Electrolytes within normal limits  Plan:  --Patient transitioned off of insulin drip to [place] insulin 12/5 --No electrolyte replacement indicated at this time --Will follow-up with AM labs tomorrow  Tressie Ellis 09/06/2020 8:02 AM

## 2020-09-07 DIAGNOSIS — J9611 Chronic respiratory failure with hypoxia: Secondary | ICD-10-CM | POA: Diagnosis not present

## 2020-09-07 DIAGNOSIS — R6521 Severe sepsis with septic shock: Secondary | ICD-10-CM | POA: Diagnosis not present

## 2020-09-07 DIAGNOSIS — A419 Sepsis, unspecified organism: Secondary | ICD-10-CM | POA: Diagnosis not present

## 2020-09-07 LAB — CBC WITH DIFFERENTIAL/PLATELET
Abs Immature Granulocytes: 0.13 10*3/uL — ABNORMAL HIGH (ref 0.00–0.07)
Basophils Absolute: 0.1 10*3/uL (ref 0.0–0.1)
Basophils Relative: 1 %
Eosinophils Absolute: 0 10*3/uL (ref 0.0–0.5)
Eosinophils Relative: 0 %
HCT: 36.3 % — ABNORMAL LOW (ref 39.0–52.0)
Hemoglobin: 11.4 g/dL — ABNORMAL LOW (ref 13.0–17.0)
Immature Granulocytes: 1 %
Lymphocytes Relative: 12 %
Lymphs Abs: 1.4 10*3/uL (ref 0.7–4.0)
MCH: 27.9 pg (ref 26.0–34.0)
MCHC: 31.4 g/dL (ref 30.0–36.0)
MCV: 89 fL (ref 80.0–100.0)
Monocytes Absolute: 1.3 10*3/uL — ABNORMAL HIGH (ref 0.1–1.0)
Monocytes Relative: 11 %
Neutro Abs: 8.3 10*3/uL — ABNORMAL HIGH (ref 1.7–7.7)
Neutrophils Relative %: 75 %
Platelets: 358 10*3/uL (ref 150–400)
RBC: 4.08 MIL/uL — ABNORMAL LOW (ref 4.22–5.81)
RDW: 15.3 % (ref 11.5–15.5)
WBC: 11.2 10*3/uL — ABNORMAL HIGH (ref 4.0–10.5)
nRBC: 0 % (ref 0.0–0.2)

## 2020-09-07 LAB — BASIC METABOLIC PANEL
Anion gap: 12 (ref 5–15)
BUN: 20 mg/dL (ref 6–20)
CO2: 28 mmol/L (ref 22–32)
Calcium: 9.4 mg/dL (ref 8.9–10.3)
Chloride: 98 mmol/L (ref 98–111)
Creatinine, Ser: <0.3 mg/dL — ABNORMAL LOW (ref 0.61–1.24)
Glucose, Bld: 314 mg/dL — ABNORMAL HIGH (ref 70–99)
Potassium: 4.4 mmol/L (ref 3.5–5.1)
Sodium: 138 mmol/L (ref 135–145)

## 2020-09-07 LAB — GLUCOSE, CAPILLARY
Glucose-Capillary: 327 mg/dL — ABNORMAL HIGH (ref 70–99)
Glucose-Capillary: 282 mg/dL — ABNORMAL HIGH (ref 70–99)
Glucose-Capillary: 253 mg/dL — ABNORMAL HIGH (ref 70–99)

## 2020-09-07 MED ORDER — DIPHENHYDRAMINE HCL 50 MG/ML IJ SOLN: 25.0000 mg | INTRAMUSCULAR | Status: DC | PRN

## 2020-09-07 MED ORDER — GLYCOPYRROLATE 1 MG PO TABS
1.0000 mg | ORAL_TABLET | ORAL | Status: DC | PRN
  Filled 2020-09-07: qty 1

## 2020-09-07 MED ORDER — ACETAMINOPHEN 650 MG RE SUPP: 650.0000 mg | Freq: Four times a day (QID) | RECTAL | Status: DC | PRN

## 2020-09-07 MED ORDER — GLYCOPYRROLATE 0.2 MG/ML IJ SOLN: 0.2000 mg | INTRAMUSCULAR | Status: DC | PRN

## 2020-09-07 MED ORDER — METOPROLOL TARTRATE 5 MG/5ML IV SOLN
2.5000 mg | Freq: Four times a day (QID) | INTRAVENOUS | Status: DC | PRN
  Administered 2020-09-07: 5 mg via INTRAVENOUS
  Administered 2020-09-07: 2.5 mg via INTRAVENOUS
  Filled 2020-09-07 (×2): qty 5

## 2020-09-07 MED ORDER — ACETAMINOPHEN 325 MG PO TABS: 650.0000 mg | ORAL_TABLET | Freq: Four times a day (QID) | ORAL | Status: DC | PRN

## 2020-09-07 MED ORDER — MORPHINE 100MG IN NS 100ML (1MG/ML) PREMIX INFUSION
0.0000 mg/h | INTRAVENOUS | Status: DC
  Administered 2020-09-07: 5 mg/h via INTRAVENOUS
  Filled 2020-09-07: qty 100

## 2020-09-07 MED ORDER — POLYVINYL ALCOHOL 1.4 % OP SOLN
1.0000 [drp] | Freq: Four times a day (QID) | OPHTHALMIC | Status: DC | PRN
  Filled 2020-09-07: qty 15

## 2020-09-07 MED ORDER — MORPHINE SULFATE (PF) 2 MG/ML IV SOLN: 2.0000 mg | INTRAVENOUS | Status: DC | PRN

## 2020-09-07 MED ORDER — MIDAZOLAM HCL 2 MG/2ML IJ SOLN: 2.0000 mg | INTRAMUSCULAR | Status: DC | PRN

## 2020-09-07 MED ORDER — DEXTROSE 5 % IV SOLN: INTRAVENOUS | Status: DC

## 2020-09-07 MED ORDER — MORPHINE BOLUS VIA INFUSION
5.0000 mg | INTRAVENOUS | Status: DC | PRN
  Administered 2020-09-07 (×2): 5 mg via INTRAVENOUS
  Filled 2020-09-07: qty 5

## 2020-09-07 MED ORDER — SODIUM CHLORIDE 0.9 % IV BOLUS
500.0000 mL | Freq: Once | INTRAVENOUS | Status: AC
  Administered 2020-09-07: 500 mL via INTRAVENOUS

## 2020-09-09 LAB — CULTURE, BLOOD (ROUTINE X 2): Special Requests: ADEQUATE

## 2020-10-02 NOTE — Progress Notes (Signed)
Patient removed from home vent and placed on comfort care per MD orders.

## 2020-10-02 NOTE — Progress Notes (Signed)
GOALS OF CARE DISCUSSION  The Clinical status was relayed to family in detail. Brothers and care taker(Aunt) met in waiting room  Updated and notified of patients medical condition.  Patient remains unresponsive and will not open eyes to command.   Upon assessment his breath sounds are course crackles with significant secretions to oral pharyngeal region.  Nasopharyngeal suction produced copious sanguineous secretions.    Patient is having a weak cough and struggling to remove secretions.   patient with increased WOB and using accessory muscles to breathe Explained to family course of therapy and the modalities     Patient with Progressive multiorgan failure with very low chance of meaningful recovery despite all aggressive and optimal medical therapy. Patient is in the Dying  Process associated with Suffering.  Family understands the situation.  They have consented and agreed to DNR/DNI and would like to proceed with Comfort care measures.  Family are satisfied with Plan of action and management. All questions answered  Additional CC time 32 mins   Muslima Toppins Patricia Pesa, M.D.  [facility]  Medical Director [place] Director [facility] Cardio-Pulmonary Department

## 2020-10-02 NOTE — Death Summary Note (Signed)
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  PROGRESSIVE AND RAPID MEDICAL DECLINE FROM ALS  Background  This is a 45 yo male who presented to [facility] ER on 09/21/23 with urinary retention secondary to dysfunctional suprapubic catheter. Per ER notes caregiver reported the suprapubic catheter appeared to be pulling out further than normal, with urinary drainage around the cathter site on 11/30.   Upon presentation to the ED vitals included: Temperature 99.5 F axillary, RR 18, HR 115, BP 152/108. ER lab results revealed Na+ 132, chloride 97, glucose 273, creatinine <0.30, hgb 12.7, WBC 7.2, lactic acid 1.6, and UA positive for UTI. His COVID-19 PCR and Influenza PCR are both negative.  In the ER previous suprapubic catheter was removed and replaced with immediate return of 800+ ml of cloudy urine. Pt also received iv cefepime. 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 SEPSIS PRESENT ON ADMISSION  09/20/2020: Pt admitted to ICU with UTI secondary to nonfunctioning suprapubic catheter with hx of ALS, chronic tracheostomy, and vent dependent  09/02/20: Patient in anasarca unresponsive, septic with UTI and background ALS with muscle atrophy. 09/03/20: Remains on  ventilator, unresponsive, anasarca noted. Severe muscle atrophy due to ALS 09/04/20:On the ventilator, unresponsive (does not do eye blink or eye tracking that he normally does) 09/05/20:On ventilator, opens eyes, no tracking. No eye blinking. Persistent anasarca 12/6 remains on vent, patient with very poor neurological status   GOALS OF CARE DISCUSSION/PALLATIVE DISCUSSION  The Clinical status was relayed to family in detail.  Updated and notified of patients medical condition.  Patient remains unresponsive and will not open eyes to command.     patient with increased WOB and using accessory muscles to breathe Explained to family course of therapy and the modalities     Patient with Progressive multiorgan failure with very low chance of meaningful recovery despite all aggressive and optimal medical therapy. Patient is in the Dying  Process associated with Suffering.  Family understands the situation.  They have consented and agreed to DNR/DNI and would like to proceed with Comfort care measures.  Family are satisfied with Plan of action and management. All questions answered      Pertinent Labs and Studies  Significant Diagnostic Studies DG Abd 1 View  Result Date: 2020-09-20 CLINICAL DATA:  45 year old male with abdominal distension. EXAM: ABDOMEN - 1 VIEW COMPARISON:  Abdominal radiograph dated 01/10/2020. FINDINGS: Percutaneous gastrostomy with balloon over the body of the stomach. No bowel dilatation or evidence of obstruction. Air is noted throughout the colon. No free air or radiopaque calculi identified. There is osteopenia. Apparent diffuse soft tissue edema and probable anasarca. IMPRESSION: No bowel obstruction. Electronically Signed   By: Elgie Collard M.D.   On: Sep 20, 2020 17:38   US RENAL  Result Date: 09/02/2020 CLINICAL DATA:  Urinary tract infection. EXAM: RENAL / URINARY TRACT ULTRASOUND COMPLETE COMPARISON:  CT abdomen and pelvis 06/27/2014. FINDINGS:  Right Kidney: Renal measurements: 12.6 x 5.5 x 7.2 cm = volume: 263 mL. Echogenicity within normal limits. No mass or hydronephrosis visualized. Left Kidney: Renal measurements: 12.9 x 6.5 x 6.3 cm = volume: 276 mL. Echogenicity within normal limits. No mass or hydronephrosis visualized. Bladder: Completely decompressed with a suprapubic catheter in place. Other: None. IMPRESSION: Negative exam. Electronically Signed   By: Drusilla Kanner M.D.   On: 09/02/2020 14:30   US Venous Img Lower Unilateral Left (DVT)  Result Date: 09/02/2020 CLINICAL DATA:  Left lower extremity edema. Former smoker. Evaluate for DVT. EXAM: LEFT LOWER EXTREMITY VENOUS DOPPLER ULTRASOUND TECHNIQUE: Gray-scale sonography with graded compression, as well as color Doppler and duplex ultrasound were performed to evaluate the lower extremity deep venous systems from the level of the common femoral vein and including the common femoral, femoral, profunda femoral, popliteal and calf veins including the posterior tibial, peroneal and gastrocnemius veins when visible. The superficial great saphenous vein was also interrogated. Spectral Doppler was utilized to evaluate flow at rest and with distal augmentation maneuvers in the common femoral, femoral and popliteal veins. COMPARISON:  None. FINDINGS: Contralateral Common Femoral Vein: Respiratory phasicity is normal and symmetric with the symptomatic side. No evidence of thrombus. Normal compressibility. Common Femoral Vein: No evidence of thrombus. Normal compressibility, respiratory phasicity and response to augmentation. Saphenofemoral Junction: No evidence of thrombus. Normal compressibility and flow on color Doppler imaging. Profunda Femoral Vein: No evidence of thrombus. Normal compressibility and flow on color Doppler imaging. Femoral Vein: No evidence of thrombus. Normal compressibility, respiratory phasicity and response to augmentation. Popliteal Vein: No evidence of thrombus. Normal  compressibility, respiratory phasicity and response to augmentation. Calf Veins: Appear patent where visualized. Superficial Great Saphenous Vein: No evidence of thrombus. Normal compressibility. Venous Reflux:  None. Other Findings:  None. IMPRESSION: No evidence of DVT within the left lower extremity. Electronically Signed   By: Simonne Come M.D.   On: 09/02/2020 07:21   US ARTERIAL LOWER EXTREMITY DUPLEX LEFT (NON-ABI)  Result Date: 09/02/2020 CLINICAL DATA:  45 year old male with history of left lower extremity pallor EXAM: NONINVASIVE PHYSIOLOGIC VASCULAR STUDY OF UNILATERAL LOWER EXTREMITIES TECHNIQUE: Evaluation of left lower extremities was performed at rest, including calculation of ankle-brachial indices, directed duplex COMPARISON:  None. FINDINGS: Right ABI:  Not performed Left ABI:  Not performed Left Lower Extremity: Directed duplex of the left lower extremity demonstrates triphasic waveforms throughout IMPRESSION: Directed duplex of the left lower extremity demonstrates waveforms maintained to the ankle. Signed, Yvone Neu. Reyne Dumas, RPVI [facility] Electronically Signed   By: Gilmer Mor D.O.   On: 09/02/2020 10:39   DG Chest Port 1 View  Result Date: 10/01/2020 CLINICAL DATA:  Sepsis EXAM: PORTABLE CHEST 1 VIEW COMPARISON:  12/10/2016 FINDINGS: Tracheostomy is in place with the retaining balloon markedly dilating the trachea. Lung volumes are extremely small and there is mild right basilar atelectasis. No pneumothorax or pleural effusion. Cardiac size is within normal limits. Pulmonary vascularity is normal. No acute bone abnormality. IMPRESSION: Hyperinflation of the retaining balloon of the tracheostomy with resultant marked focal dilation of the trachea. Pulmonary hypoinflation. Electronically Signed   By: Helyn Numbers MD   On: 09/21/2020 04:27    Microbiology Recent Results (from the past 240 hour(s))  Blood Culture  (routine x 2)     Status: None  Collection Time: 2020/09/25  3:42 AM   Specimen: BLOOD  Result Value Ref Range Status   Specimen Description BLOOD RIGHT HAND  Final   Special Requests   Final    BOTTLES DRAWN AEROBIC AND ANAEROBIC Blood Culture results may not be optimal due to an inadequate volume of blood received in culture bottles   Culture   Final    NO GROWTH 5 DAYS Performed at [facility], [address] [place], [place] [address]    Report Status 09/06/2020 FINAL  Final  Blood Culture (routine x 2)     Status: None (Preliminary result)   Collection Time: 09/25/20  3:42 AM   Specimen: BLOOD  Result Value Ref Range Status   Specimen Description   Final    BLOOD LEFT FA Performed at [facility], [address] [place], [place] [address]    Special Requests   Final    BOTTLES DRAWN AEROBIC AND ANAEROBIC Blood Culture adequate volume Performed at [facility], [address] [place], [place] [address]    Culture  Setup Time   Final    AEROBIC BOTTLE ONLY GRAM POSITIVE RODS CRITICAL RESULT CALLED TO, READ BACK BY AND VERIFIED WITH: LISA KLUTTZ 09/04/20 AT 2137 BY ACR    Culture   Final    GRAM POSITIVE RODS CULTURE REINCUBATED FOR BETTER GROWTH Performed at [facility], [address], [place], [place] [address]    Report Status PENDING  Incomplete  Urine culture     Status: Abnormal   Collection Time: 09/25/2020  3:42 AM   Specimen: In/Out Cath Urine  Result Value Ref Range Status   Specimen Description   Final    IN/OUT CATH URINE Performed at [facility], [address], [place], [place] [address]    Special Requests   Final    NONE Performed at [facility], [address] [place], [place] [address]    Culture MULTIPLE SPECIES PRESENT, SUGGEST RECOLLECTION (A)  Final   Report Status 09/02/2020 FINAL  Final  Resp Panel by RT-PCR (Flu A&B, Covid) Nasopharyngeal Swab     Status: None   Collection Time:  25-Sep-2020  6:39 AM   Specimen: Nasopharyngeal Swab; Nasopharyngeal(NP) swabs in vial transport medium  Result Value Ref Range Status   SARS Coronavirus 2 by RT PCR NEGATIVE NEGATIVE Final    Comment: (NOTE) SARS-CoV-2 target nucleic acids are NOT DETECTED.  The SARS-CoV-2 RNA is generally detectable in upper respiratory specimens during the acute phase of infection. The lowest concentration of SARS-CoV-2 viral copies this assay can detect is 138 copies/mL. A negative result does not preclude SARS-Cov-2 infection and should not be used as the sole basis for treatment or other patient management decisions. A negative result may occur with  improper specimen collection/handling, submission of specimen other than nasopharyngeal swab, presence of viral mutation(s) within the areas targeted by this assay, and inadequate number of viral copies(<138 copies/mL). A negative result must be combined with clinical observations, patient history, and epidemiological information. The expected result is Negative.  Fact Sheet for Patients:  BloggerCourse.com  Fact Sheet for Healthcare Providers:  SeriousBroker.it  This test is no t yet approved or cleared by the [place] FDA and  has been authorized for detection and/or diagnosis of SARS-CoV-2 by FDA under an Emergency Use Authorization (EUA). This EUA will remain  in effect (meaning this test can be used) for the duration of the COVID-19 declaration under Section 564(b)(1)  of the Act, 21 U.S.C.section 360bbb-3(b)(1), unless the authorization is terminated  or revoked sooner.       Influenza A by PCR NEGATIVE NEGATIVE Final   Influenza B by PCR NEGATIVE NEGATIVE Final    Comment: (NOTE) The Xpert Xpress SARS-CoV-2/FLU/RSV plus assay is intended as an aid in the diagnosis of influenza from Nasopharyngeal swab specimens and should not be used as a sole basis for treatment. Nasal washings  and aspirates are unacceptable for Xpert Xpress SARS-CoV-2/FLU/RSV testing.  Fact Sheet for Patients: BloggerCourse.com  Fact Sheet for Healthcare Providers: SeriousBroker.it  This test is not yet approved or cleared by the [place] FDA and has been authorized for detection and/or diagnosis of SARS-CoV-2 by FDA under an Emergency Use Authorization (EUA). This EUA will remain in effect (meaning this test can be used) for the duration of the COVID-19 declaration under Section 564(b)(1) of the Act, 21 U.S.C. section 360bbb-3(b)(1), unless the authorization is terminated or revoked.  Performed at [facility], [address] [place], [place] [address]   Respiratory Panel by PCR     Status: None   Collection Time: 09/08/2020 11:45 AM  Result Value Ref Range Status   Adenovirus NOT DETECTED NOT DETECTED Final   Coronavirus 229E NOT DETECTED NOT DETECTED Final    Comment: (NOTE) The Coronavirus on the Respiratory Panel, DOES NOT test for the novel  Coronavirus (2019 nCoV)    Coronavirus HKU1 NOT DETECTED NOT DETECTED Final   Coronavirus NL63 NOT DETECTED NOT DETECTED Final   Coronavirus OC43 NOT DETECTED NOT DETECTED Final   Metapneumovirus NOT DETECTED NOT DETECTED Final   Rhinovirus / Enterovirus NOT DETECTED NOT DETECTED Final   Influenza A NOT DETECTED NOT DETECTED Final   Influenza B NOT DETECTED NOT DETECTED Final   Parainfluenza Virus 1 NOT DETECTED NOT DETECTED Final   Parainfluenza Virus 2 NOT DETECTED NOT DETECTED Final   Parainfluenza Virus 3 NOT DETECTED NOT DETECTED Final   Parainfluenza Virus 4 NOT DETECTED NOT DETECTED Final   Respiratory Syncytial Virus NOT DETECTED NOT DETECTED Final   Bordetella pertussis NOT DETECTED NOT DETECTED Final   Chlamydophila pneumoniae NOT DETECTED NOT DETECTED Final   Mycoplasma pneumoniae NOT DETECTED NOT DETECTED Final    Comment: Performed at [facility],  [address], [place], [place] [address]  MRSA PCR Screening     Status: None   Collection Time: 09/08/2020  4:20 PM   Specimen: Nasal Mucosa; Nasopharyngeal  Result Value Ref Range Status   MRSA by PCR NEGATIVE NEGATIVE Final    Comment:        The GeneXpert MRSA Assay (FDA approved for NASAL specimens only), is one component of a comprehensive MRSA colonization surveillance program. It is not intended to diagnose MRSA infection nor to guide or monitor treatment for MRSA infections. Performed at [facility], [address] [place], [place] [address]   Culture, respiratory     Status: None   Collection Time: 09/02/20  1:20 PM   Specimen: Trachea; Respiratory  Result Value Ref Range Status   Specimen Description   Final    TRACHEAL SITE Performed at [facility], [address] [place], [place] [address]    Special Requests   Final    NONE Performed at [facility], [address] [place], [place] [address]    Gram Stain   Final    ABUNDANT WBC PRESENT,BOTH PMN AND MONONUCLEAR FEW GRAM VARIABLE ROD  Culture   Final    Normal respiratory flora-no Staph aureus or Pseudomonas seen Performed at [facility], [address] [place], [place] [address]    Report Status 09/04/2020 FINAL  Final  Urine Culture     Status: Abnormal   Collection Time: 09/03/20  7:50 PM   Specimen: Urine, Random  Result Value Ref Range Status   Specimen Description   Final    URINE, RANDOM Performed at [facility], [address] [place], [place] [address]    Special Requests   Final    NONE Performed at [facility], [address] [place], [place] [address]    Culture 60,000 COLONIES/mL YEAST (A)  Final   Report Status 09/05/2020 FINAL  Final    Lab Basic Metabolic Panel: Recent Labs  Lab 09/02/20 0011 09/02/20 0011 09/02/20 1056 09/02/20 1745 09/03/20 0403 09/03/20 0403 09/03/20 2304 09/04/20 0448 09/05/20 0743  09/06/20 0519 02/11/20 0418  NA 137   < >  --   --  141  --   --  139 133* 135 138  K 4.2   < >   < > 5.2* 3.9   < > 3.9 4.0 5.4* 3.9 4.4  CL 102   < >  --   --  107  --   --  100 95* 95* 98  CO2 21*   < >  --   --  23  --   --  26 26 28 28   GLUCOSE 439*   < >  --   --  158*  --   --  143* 198* 313* 314*  BUN 13   < >  --   --  16  --   --  16 15 18 20   CREATININE <0.30*   < >  --   --  <0.30*  --   --  <0.30* <0.30* <0.30* <0.30*  CALCIUM 9.4   < >  --   --  8.7*  --   --  9.1 9.1 9.6 9.4  MG 2.4  --   --   --  2.2  --   --  2.1 1.8 2.2  --   PHOS 4.1  --   --  2.9 2.7  --   --  3.5 4.2 4.8*  --    < > = values in this interval not displayed.   Liver Function Tests: Recent Labs  Lab 09/03/2020 0342  AST 41  ALT 46*  ALKPHOS 112  BILITOT 0.8  PROT 7.7  ALBUMIN 3.6   No results for input(s): LIPASE, AMYLASE in the last 168 hours. No results for input(s): AMMONIA in the last 168 hours. CBC: Recent Labs  Lab 09/09/2020 0342 09/02/20 0011 09/03/20 0403 09/04/20 0448 09/05/20 0743 09/06/20 0519 02/11/20 0418  WBC 7.2   < > 6.9 8.5 11.3* 11.0* 11.2*  NEUTROABS 5.2  --   --   --   --   --  8.3*  HGB 12.7*   < > 10.9* 11.1* 11.5* 12.2* 11.4*  HCT 39.7   < > 34.2* 34.3* 34.3* 37.2* 36.3*  MCV 89.6   < > 90.0 88.9 85.5 87.1 89.0  PLT 313   < > 293 300 309 362 358   < > = values in this interval not displayed.   Cardiac Enzymes: No results for input(s): CKTOTAL, CKMB, CKMBINDEX, TROPONINI in the last 168 hours. Sepsis Labs: Recent Labs  Lab 09/26/2020 [phone]  September 24, 2020 0342 09/02/20 0011 09/03/20 0403 09/04/20 0448 09/05/20 0743 09/06/20 0519 09/14/2020 0418  WBC 7.2   < > 6.7   < > 8.5 11.3* 11.0* 11.2*  LATICACIDVEN 1.6  --  1.6  --   --   --   --   --    < > = values in this interval not displayed.      Erin Fulling 09/15/2020, 3:36 PM

## 2020-10-02 NOTE — TOC Progression Note (Addendum)
Transition of Care [facility]) - Progression Note    Patient Details  Name: Randy Roach MRN: [id number] Date of Birth: [birth year]  Transition of Care [facility]) CM/SW Contact  Marina Goodell Phone Number:  [phone] October 05, 2020, 2:50 PM  Clinical Narrative:     Family meeting today w/ Dr. Belia Heman.  Patient extubated today with family at bedside.  Patient has been changed to DNR/DNI and comfort care measures.  TOC will continue to follow.  Expected Discharge Plan: Home w Home Health Services (Active w/ [facility]) Barriers to Discharge: Continued Medical Work up  Expected Discharge Plan and Services Expected Discharge Plan: Home w Home Health Services (Active w/ [facility]) In-house Referral: Clinical Social Work   Post Acute Care Choice: Home Health (Active w/ [facility]) Living arrangements for the past 2 months: Mobile Home                                       Social Determinants of Health (SDOH) Interventions    Readmission Risk Interventions No flowsheet data found.

## 2020-10-02 NOTE — Progress Notes (Signed)
CH encountered pt.'s brother Chrissie Noa in ICU hallway outside pt.'s rm.  William tearfully shared that pt. died shortly after extubation several minutes earlier.  Brother Loraine Leriche shared that both his father and his father's twn brother had also died of ALS at a very young age.  Family left shortly after but expressed gratitude to [facility] for support, requesting continued prayers.

## 2020-10-02 NOTE — Plan of Care (Signed)

## 2020-10-02 NOTE — Consult Note (Signed)
PHARMACY CONSULT NOTE  Pharmacy Consult for Electrolyte Monitoring and Replacement   Recent Labs: Potassium (mmol/L)  Date Value  09/05/2020 4.4  01/12/2015 3.8   Magnesium (mg/dL)  Date Value  00/92/3300 2.2   Calcium (mg/dL)  Date Value  76/22/6333 9.4   Calcium, Total (mg/dL)  Date Value  54/56/2563 9.0   Albumin (g/dL)  Date Value  89/37/3428 3.6  01/12/2015 4.1   Phosphorus (mg/dL)  Date Value  76/81/1572 4.8 (H)   Sodium (mmol/L)  Date Value  09/02/2020 138  01/12/2015 137    Assessment: Patient is a 45 y/o M with medical history including ALS s/p tracheostomy and PEG tube who is vent dependent who was admitted 12/1 with UTI due to nonfunctioning suprapubic catheter. Patient initially requiring high rate insulin infusion for glucose control which was transitioned to [facility] insulin 12/5. Glucoses remain difficult to control.   Nutrition: Glucerna 1.5 at 75 mL/hr x 19h + free water flushes 100 mL q4h  Goal of Therapy:  Electrolytes within normal limits  Plan:  --No electrolyte replacement indicated at this time --Will discontinue consult at this time given stable electrolytes for several days. Will continue to monitor peripherally  Tressie Ellis 09/04/2020 8:09 AM

## 2020-10-02 NOTE — Progress Notes (Signed)
CH received PG from RN to provide support to pt.'s family as pt. is extubated this afternoon; when CH arrived in rm., pt. lying in bed, eyes open, on vent support via trach, not responding per brother Randy Roach at bedside.  Randy Roach and his wife requested CH pray for pt. as today is his birthday; 'He knows who God is... He's a God-fearing man,' Mark shared.  CH prayed for pt. to experience a sense of divine peace and nearness and for peace for family.  CH remains available this PM as needed to support pt./family.

## 2020-10-02 NOTE — Plan of Care (Signed)
Goals of care discussion with patient's family today. Dr. Belia Heman provided update on patient's current status and discussed the patient's poor prognosis. Family verbalized understanding of the patient's situation, stating they had noticed functional decline over the last several weeks. The family voiced that their goal was to minimize any pain and suffering, recognizing that he is at end of life. Family would like to focus on comfort only and has agreed to proceed with comfort care measures. Transition processes explained and questions answered at this time.   Comfort measure orders placed. Continuous morphine infusion started. Vent turned off at 1505. Patient time of death 09/26/20 @ 15:30 PM. Family at bedside.

## 2020-10-02 NOTE — TOC Transition Note (Signed)
Transition of Care [facility]) - CM/SW Discharge Note   Patient Details  Name: Randy Roach MRN: [id number] Date of Birth: 07/06/1976  Transition of Care [facility]) CM/SW Contact:  Marina Goodell Phone Number: [phone] 09/16/2020, 4:08 PM   Clinical Narrative:     Patient deceased 09/16/2020 @ 3:30PM.  TOC Consult Complete.  Final next level of care: Home w Home Health Services Barriers to Discharge: Continued Medical Work up   Patient Goals and CMS Choice        Discharge Placement                       Discharge Plan and Services In-house Referral: Clinical Social Work   Post Acute Care Choice: Home Health (Active w/ [facility])                               Social Determinants of Health (SDOH) Interventions     Readmission Risk Interventions No flowsheet data found.

## 2020-10-02 NOTE — Progress Notes (Signed)
Inpatient Diabetes Program Recommendations  AACE/ADA: New Consensus Statement on Inpatient Glycemic Control (2015)  Target Ranges:  Prepandial:   less than 140 mg/dL      Peak postprandial:   less than 180 mg/dL (1-2 hours)      Critically ill patients:  140 - 180 mg/dL   Lab Results  Component Value Date   GLUCAP 253 (H) 09/14/2020   HGBA1C 8.4 (H) 09/02/2020    Review of Glycemic Control Results for Randy Roach, Randy Roach (MRN [id number]) as of 09/06/2020 11:09  Ref. Range 09/06/2020 19:39 09/06/2020 23:29 09/27/2020 05:03 09/23/2020 06:50 09/29/2020 10:58  Glucose-Capillary Latest Ref Range: 70 - 99 mg/dL 709 (H) 628 (H) 366 (H) 282 (H) 253 (H)   Diabetes history:DM2 Outpatient Diabetes medications:NPH 28 units BID Current orders for Inpatient glycemic control:I Levemir 32 units bid Novolog 0-6 units q4h Novolog 7 units q4h Glucerna @ 75 ml/hr Inpatient Diabetes Program Recommendations:   Note feeds held for now.  Note RN's plans to watch blood sugars closely.   Once feeds restarted, may need increase of Novolog tube feed coverage to 10 units q 4 hours.  Also may need increase in basal insulin as well.  Will follow.   Thanks  Beryl Meager, RN, BC-ADM Inpatient Diabetes Coordinator Pager [phone] (8a-5p)

## 2020-10-02 NOTE — Progress Notes (Signed)
Pt increasingly tachy overnight, 140s-150s. NP aware. PRN Oxy, Valium, Morphine, and Baclofen given without positive effects on HR. NP ordered 500 ml NS bolus, w/ no effect. New PRN order of Lopressor given (2.5 mg administered), Pt's HR immediately settled in 120s.   Pt's remains neurologically the same. Q4 BG checks continued. Turn q2. Pt in no acute distress @ this time. Will continue to monitor.

## 2020-10-02 NOTE — Progress Notes (Signed)
Tube feeds stopped and held at 0830. Abdomen distended and taut. Assessment of PEG tube concerning for potential malposition of tube. Old blood noted at PEG site. Manipulation of tube causes large amount of yellow and clear thin drainage, assuming to be tube feeds and water.   Dr. Belia Heman notified of findings. He advised to stop TF at this time and hold medications given through the tube. Discussed need for D10 infusion with Pharmacist, Trinna Post, who advised to hold off at this time. Patient has historically remained hyperglycemic. Will repeat CBG in 2 hours.   Site cleansed and dry split gauze dressing applied.

## 2020-10-02 NOTE — Progress Notes (Signed)
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   Background  This is a 45 yo male who presented to [facility] ER on 12/1 with urinary retention secondary to dysfunctional suprapubic catheter. Per ER notes caregiver reported the suprapubic catheter appeared to be pulling out further than normal, with urinary drainage around the cathter site on 11/30.   Upon presentation to the ED vitals included: Temperature 99.5 F axillary, RR 18, HR 115, BP 152/108. ER lab results revealed Na+ 132, chloride 97, glucose 273, creatinine <0.30, hgb 12.7, WBC 7.2, lactic acid 1.6, and UA positive for UTI. His COVID-19 PCR and Influenza PCR are both negative.  In the ER previous suprapubic catheter was removed and replaced with immediate return of 800+ ml of cloudy urine. Pt also received iv cefepime. 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  SEPSIS PRESENT ON ADMISSION  09/05/2020: Pt admitted to ICU with UTI secondary to nonfunctioning suprapubic catheter with hx of ALS, chronic tracheostomy, and vent dependent  09/02/20: Patient in anasarca unresponsive, septic with UTI and background ALS with muscle atrophy. 09/03/20: Remains on ventilator, unresponsive, anasarca noted. Severe muscle atrophy due to ALS 09/04/20:On the ventilator, unresponsive (does not do eye blink or eye tracking that he normally does) 09/05/20:On ventilator, opens eyes, no tracking. No eye blinking. Persistent anasarca 12/6 remains on vent, patient with very poor neurological status      CC  follow up respiratory failure  SUBJECTIVE Patient remains critically ill Prognosis is guarded unresponsive   BP (!) 135/91   Pulse (!) 127   Temp 99 F (37.2 C) (Axillary)   Resp 20   Ht 5' 5.98" (1.676 m)   Wt 81 kg   SpO2 97%   BMI 28.84  kg/m    I/O last 3 completed shifts: In: 2013.1 [I.V.:6.2; NG/GT:1706.8; IV Piggyback:300] Out: 3900 [Urine:3900] No intake/output data recorded.  SpO2: 97 % O2 Flow Rate (L/min): 5 L/min FiO2 (%): 40 %  Estimated body mass index is 28.84 kg/m as calculated from the following:   Height as of this encounter: 5' 5.98" (1.676 m).   Weight as of this encounter: 81 kg.  SIGNIFICANT EVENTS   REVIEW OF SYSTEMS  PATIENT IS UNABLE TO PROVIDE COMPLETE REVIEW OF SYSTEMS DUE TO SEVERE CRITICAL ILLNESS        PHYSICAL EXAMINATION:  GENERAL:critically ill appearing, +resp distress HEAD: Normocephalic, atraumatic.  EYES: Pupils equal, round, reactive to light.  No scleral icterus.  MOUTH: Moist mucosal membrane. NECK: Supple.  PULMONARY: +rhonchi, +wheezing CARDIOVASCULAR: S1 and S2. Regular rate and rhythm. No murmurs, rubs, or gallops.  GASTROINTESTINAL: Soft, nontender, -distended.  Positive bowel sounds.   MUSCULOSKELETAL: No swelling, clubbing, or edema.  NEUROLOGIC: obtunded, GCS<8 SKIN:intact,warm,dry  MEDICATIONS: I have reviewed all medications and confirmed regimen as documented   CULTURE RESULTS   Recent Results (from the past 240 hour(s))  Blood Culture (routine x 2)     Status: None   Collection Time: 2020-04-01  3:42 AM   Specimen: BLOOD  Result Value Ref Range Status   Specimen Description BLOOD RIGHT HAND  Final   Special Requests   Final    BOTTLES DRAWN AEROBIC AND ANAEROBIC Blood Culture results may not be optimal due to an inadequate volume of blood received in culture bottles   Culture  Final    NO GROWTH 5 DAYS Performed at [facility], [address] [place], [place] [address]    Report Status 09/06/2020 FINAL  Final  Blood Culture (routine x 2)     Status: None (Preliminary result)   Collection Time: 09/04/20  3:42 AM   Specimen: BLOOD  Result Value Ref Range Status   Specimen Description   Final    BLOOD LEFT FA Performed at  [facility], [address] [place], [place] [address]    Special Requests   Final    BOTTLES DRAWN AEROBIC AND ANAEROBIC Blood Culture adequate volume Performed at [facility], [address] [place], [place] [address]    Culture  Setup Time   Final    AEROBIC BOTTLE ONLY GRAM POSITIVE RODS CRITICAL RESULT CALLED TO, READ BACK BY AND VERIFIED WITH: LISA KLUTTZ 09/04/20 AT 2137 BY ACR    Culture   Final    GRAM POSITIVE RODS CULTURE REINCUBATED FOR BETTER GROWTH Performed at [facility], [address] [place], [place] [address]    Report Status PENDING  Incomplete  Urine culture     Status: Abnormal   Collection Time: Sep 04, 2020  3:42 AM   Specimen: In/Out Cath Urine  Result Value Ref Range Status   Specimen Description   Final    IN/OUT CATH URINE Performed at [facility], [address] [place], [place] [address]    Special Requests   Final    NONE Performed at [facility], [address] [place], [place] [address]    Culture MULTIPLE SPECIES PRESENT, SUGGEST RECOLLECTION (A)  Final   Report Status 09/02/2020 FINAL  Final  Resp Panel by RT-PCR (Flu A&B, Covid) Nasopharyngeal Swab     Status: None   Collection Time: 2020/09/04  6:39 AM   Specimen: Nasopharyngeal Swab; Nasopharyngeal(NP) swabs in vial transport medium  Result Value Ref Range Status   SARS Coronavirus 2 by RT PCR NEGATIVE NEGATIVE Final    Comment: (NOTE) SARS-CoV-2 target nucleic acids are NOT DETECTED.  The SARS-CoV-2 RNA is generally detectable in upper respiratory specimens during the acute phase of infection. The lowest concentration of SARS-CoV-2 viral copies this assay can detect is 138 copies/mL. A negative result does not preclude SARS-Cov-2 infection and should not be used as the sole basis for treatment or other patient management decisions. A negative result may occur with  improper specimen collection/handling, submission of specimen  other than nasopharyngeal swab, presence of viral mutation(s) within the areas targeted by this assay, and inadequate number of viral copies(<138 copies/mL). A negative result must be combined with clinical observations, patient history, and epidemiological information. The expected result is Negative.  Fact Sheet for Patients:  BloggerCourse.com  Fact Sheet for Healthcare Providers:  SeriousBroker.it  This test is no t yet approved or cleared by the [place] FDA and  has been authorized for detection and/or diagnosis of SARS-CoV-2 by FDA under an Emergency Use Authorization (EUA). This EUA will remain  in effect (meaning this test can be used) for the duration of the COVID-19 declaration under Section 564(b)(1) of the Act, 21 U.S.C.section 360bbb-3(b)(1), unless the authorization is terminated  or revoked sooner.       Influenza A by PCR NEGATIVE NEGATIVE Final   Influenza B by PCR NEGATIVE NEGATIVE Final    Comment: (NOTE) The Xpert Xpress SARS-CoV-2/FLU/RSV plus assay is intended as an aid in the diagnosis of influenza from Nasopharyngeal swab specimens  and should not be used as a sole basis for treatment. Nasal washings and aspirates are unacceptable for Xpert Xpress SARS-CoV-2/FLU/RSV testing.  Fact Sheet for Patients: BloggerCourse.com  Fact Sheet for Healthcare Providers: SeriousBroker.it  This test is not yet approved or cleared by the [place] FDA and has been authorized for detection and/or diagnosis of SARS-CoV-2 by FDA under an Emergency Use Authorization (EUA). This EUA will remain in effect (meaning this test can be used) for the duration of the COVID-19 declaration under Section 564(b)(1) of the Act, 21 U.S.C. section 360bbb-3(b)(1), unless the authorization is terminated or revoked.  Performed at [facility], [address]  [place], [place] [address]   Respiratory Panel by PCR     Status: None   Collection Time: 09/24/2020 11:45 AM  Result Value Ref Range Status   Adenovirus NOT DETECTED NOT DETECTED Final   Coronavirus 229E NOT DETECTED NOT DETECTED Final    Comment: (NOTE) The Coronavirus on the Respiratory Panel, DOES NOT test for the novel  Coronavirus (2019 nCoV)    Coronavirus HKU1 NOT DETECTED NOT DETECTED Final   Coronavirus NL63 NOT DETECTED NOT DETECTED Final   Coronavirus OC43 NOT DETECTED NOT DETECTED Final   Metapneumovirus NOT DETECTED NOT DETECTED Final   Rhinovirus / Enterovirus NOT DETECTED NOT DETECTED Final   Influenza A NOT DETECTED NOT DETECTED Final   Influenza B NOT DETECTED NOT DETECTED Final   Parainfluenza Virus 1 NOT DETECTED NOT DETECTED Final   Parainfluenza Virus 2 NOT DETECTED NOT DETECTED Final   Parainfluenza Virus 3 NOT DETECTED NOT DETECTED Final   Parainfluenza Virus 4 NOT DETECTED NOT DETECTED Final   Respiratory Syncytial Virus NOT DETECTED NOT DETECTED Final   Bordetella pertussis NOT DETECTED NOT DETECTED Final   Chlamydophila pneumoniae NOT DETECTED NOT DETECTED Final   Mycoplasma pneumoniae NOT DETECTED NOT DETECTED Final    Comment: Performed at [facility], [address] [place], [place] [address]  MRSA PCR Screening     Status: None   Collection Time: 09/24/2020  4:20 PM   Specimen: Nasal Mucosa; Nasopharyngeal  Result Value Ref Range Status   MRSA by PCR NEGATIVE NEGATIVE Final    Comment:        The GeneXpert MRSA Assay (FDA approved for NASAL specimens only), is one component of a comprehensive MRSA colonization surveillance program. It is not intended to diagnose MRSA infection nor to guide or monitor treatment for MRSA infections. Performed at [facility], [address] [place], [place] [address]   Culture, respiratory     Status: None   Collection Time: 09/02/20  1:20 PM   Specimen: Trachea; Respiratory  Result Value Ref Range  Status   Specimen Description   Final    TRACHEAL SITE Performed at [facility], [address] [place], [place] [address]    Special Requests   Final    NONE Performed at [facility], [address] [place], [place] [address]    Gram Stain   Final    ABUNDANT WBC PRESENT,BOTH PMN AND MONONUCLEAR FEW GRAM VARIABLE ROD    Culture   Final    Normal respiratory flora-no Staph aureus or Pseudomonas seen Performed at [facility], [address], [place], [place] [address]    Report Status 09/04/2020 FINAL  Final  Urine Culture     Status: Abnormal   Collection Time: 09/03/20  7:50 PM   Specimen: Urine, Random  Result Value  Ref Range Status   Specimen Description   Final    URINE, RANDOM Performed at [facility], [address] [place], [place] [address]    Special Requests   Final    NONE Performed at [facility], [address] [place], [place] [address]    Culture 60,000 COLONIES/mL YEAST (A)  Final   Report Status 09/05/2020 FINAL  Final        CBC    Component Value Date/Time   WBC 11.2 (H) 2020/09/16 0418   RBC 4.08 (L) 2020/09/16 0418   HGB 11.4 (L) Sep 16, 2020 0418   HGB 13.9 01/12/2015 2224   HCT 36.3 (L) 09/16/2020 0418   HCT 40.9 01/12/2015 2224   PLT 358 16-Sep-2020 0418   PLT 357 01/12/2015 2224   MCV 89.0 09/16/2020 0418   MCV 92 01/12/2015 2224   MCH 27.9 September 16, 2020 0418   MCHC 31.4 2020-09-16 0418   RDW 15.3 Sep 16, 2020 0418   RDW 13.8 01/12/2015 2224   LYMPHSABS 1.4 09-16-2020 0418   LYMPHSABS 3.3 01/12/2015 2224   MONOABS 1.3 (H) 2020/09/16 0418   MONOABS 0.7 01/12/2015 2224   EOSABS 0.0 09/16/2020 0418   EOSABS 0.2 01/12/2015 2224   BASOSABS 0.1 09-16-2020 0418   BASOSABS 0.1 01/12/2015 2224   BMP Latest Ref Rng & Units 09-16-20 09/06/2020 09/05/2020  Glucose 70 - 99 mg/dL 098(J) 191(Y) 782(N)  BUN 6 - 20 mg/dL 20 18 15   Creatinine 0.61 - 1.24 mg/dL ) <5.62(Z) <3.08(M)  Sodium 135 -  145 mmol/L 138 135 133(L)  Potassium 3.5 - 5.1 mmol/L 4.4 3.9 5.4(H)  Chloride 98 - 111 mmol/L 98 95(L) 95(L)  CO2 22 - 32 mmol/L 28 28 26   Calcium 8.9 - 10.3 mg/dL 9.4 9.6 9.1      IMAGING    No results found.   Nutrition Status: Nutrition Problem: Inadequate oral intake Etiology: inability to eat Signs/Symptoms: NPO status (reliance on tube feeds/free water via G-tube to meet calorie/protein/hydration needs) Interventions: Tube feeding     Indwelling Urinary Catheter continued, requirement due to   Reason to continue Indwelling Urinary Catheter strict Intake/Output monitoring for hemodynamic instability   Central Line/ continued, requirement due to  Reason to continue <5.78(I Monitoring of central venous pressure or other hemodynamic parameters and poor IV access   Ventilator continued, requirement due to severe respiratory failure   Ventilator Sedation RASS 0 to -2      ASSESSMENT AND PLAN SYNOPSIS   Severe ACUTE Hypoxic and Hypercapnic Respiratory Failure due to severe sepsis and septic shock with UTI with acute on chronic resp failure on vent support  -continue Full MV support  Morbid obesity, possible OSA.   Will certainly impact respiratory mechanics, ventilator weaning Suspect will need to consider additional PEEP  ACUTE KIDNEY INJURY/Renal Failure -continue Foley Catheter-assess need -Avoid nephrotoxic agents -Follow urine output, BMP -Ensure adequate renal perfusion, optimize oxygenation -Renal dose medications     NEUROLOGY Acute toxic metabolic encephalopathy with advanced ALS Prognosis is poor   SHOCK-SEPSIS/-resolved  CARDIAC ICU monitoring  ID -continue IV abx as prescibed -follow up cultures  GI GI PROPHYLAXIS as indicated  NUTRITIONAL STATUS Nutrition Status: Nutrition Problem: Inadequate oral intake Etiology: inability to eat Signs/Symptoms: NPO status (reliance on tube feeds/free water via G-tube to meet  calorie/protein/hydration needs) Interventions: Tube feeding   DIET-->TF's as tolerated Constipation protocol as indicated  ENDO - will use ICU hypoglycemic\Hyperglycemia protocol if indicated     ELECTROLYTES -follow labs as  needed -replace as needed -pharmacy consultation and following   DVT/GI PRX ordered and assessed TRANSFUSIONS AS NEEDED MONITOR FSBS I Assessed the need for Labs I Assessed the need for Foley I Assessed the need for Central Venous Line Family Discussion when available I Assessed the need for Mobilization I made an Assessment of medications to be adjusted accordingly Safety Risk assessment completed   CASE DISCUSSED IN MULTIDISCIPLINARY ROUNDS WITH ICU TEAM  Critical Care Time devoted to patient care services described in this note is 45  minutes.   Overall, patient is critically ill, prognosis is guarded.  Patient with Multiorgan failure and at high risk for cardiac arrest and death.    Patient is DNR status, I strongly recommend comfort care measures, patient has very poor quality of life   Leonela Kivi Santiago Glad, M.D.  [facility]  Medical Director [facility] Medical Director [facility] Cardio-Pulmonary Department

## 2020-10-02 DEATH — deceased

## 2020-10-28 ENCOUNTER — Other Ambulatory Visit: Payer: Medicare Other | Admitting: Adult Health Nurse Practitioner
